# Patient Record
Sex: Female | Born: 1945
Health system: Southern US, Community
[De-identification: ages and names within clinical notes are randomized; demographics above are authoritative.]

## PROBLEM LIST (undated history)

## (undated) DIAGNOSIS — M1990 Unspecified osteoarthritis, unspecified site: Secondary | ICD-10-CM

## (undated) DIAGNOSIS — F329 Major depressive disorder, single episode, unspecified: Secondary | ICD-10-CM

## (undated) DIAGNOSIS — R519 Headache, unspecified: Secondary | ICD-10-CM

## (undated) DIAGNOSIS — K219 Gastro-esophageal reflux disease without esophagitis: Secondary | ICD-10-CM

## (undated) DIAGNOSIS — G459 Transient cerebral ischemic attack, unspecified: Secondary | ICD-10-CM

## (undated) DIAGNOSIS — I499 Cardiac arrhythmia, unspecified: Secondary | ICD-10-CM

## (undated) DIAGNOSIS — I10 Essential (primary) hypertension: Secondary | ICD-10-CM

## (undated) DIAGNOSIS — K449 Diaphragmatic hernia without obstruction or gangrene: Secondary | ICD-10-CM

## (undated) DIAGNOSIS — F32A Depression, unspecified: Secondary | ICD-10-CM

## (undated) DIAGNOSIS — K635 Polyp of colon: Secondary | ICD-10-CM

## (undated) HISTORY — PX: ABLATION: SHX5711

## (undated) HISTORY — DX: Diaphragmatic hernia without obstruction or gangrene: K44.9

## (undated) HISTORY — DX: Major depressive disorder, single episode, unspecified: F32.9

## (undated) HISTORY — PX: ABDOMINAL HYSTERECTOMY: SHX81

## (undated) HISTORY — PX: PACEMAKER IMPLANT: EP1218

## (undated) HISTORY — DX: Depression, unspecified: F32.A

## (undated) HISTORY — DX: Essential (primary) hypertension: I10

## (undated) HISTORY — PX: INGUINAL HERNIA REPAIR: SUR1180

## (undated) HISTORY — PX: HERNIA REPAIR: SHX51

## (undated) HISTORY — DX: Polyp of colon: K63.5

## (undated) HISTORY — PX: CHOLECYSTECTOMY: SHX55

## (undated) HISTORY — PX: OTHER SURGICAL HISTORY: SHX169

## (undated) HISTORY — DX: Gastro-esophageal reflux disease without esophagitis: K21.9

---

## 1999-09-17 ENCOUNTER — Encounter: Admission: RE | Admit: 1999-09-17 | Discharge: 1999-09-17 | Payer: Self-pay | Admitting: Family Medicine

## 1999-09-17 ENCOUNTER — Encounter: Payer: Self-pay | Admitting: Family Medicine

## 1999-09-23 ENCOUNTER — Encounter: Payer: Self-pay | Admitting: Family Medicine

## 1999-09-23 ENCOUNTER — Encounter: Admission: RE | Admit: 1999-09-23 | Discharge: 1999-09-23 | Payer: Self-pay | Admitting: Family Medicine

## 2000-12-02 ENCOUNTER — Encounter: Payer: Self-pay | Admitting: Obstetrics and Gynecology

## 2000-12-02 ENCOUNTER — Encounter: Admission: RE | Admit: 2000-12-02 | Discharge: 2000-12-02 | Payer: Self-pay | Admitting: Obstetrics and Gynecology

## 2001-03-09 ENCOUNTER — Other Ambulatory Visit: Admission: RE | Admit: 2001-03-09 | Discharge: 2001-03-09 | Payer: Self-pay | Admitting: Obstetrics and Gynecology

## 2001-10-27 ENCOUNTER — Encounter: Payer: Self-pay | Admitting: Obstetrics and Gynecology

## 2001-10-27 ENCOUNTER — Ambulatory Visit (HOSPITAL_COMMUNITY): Admission: RE | Admit: 2001-10-27 | Discharge: 2001-10-27 | Payer: Self-pay | Admitting: Obstetrics and Gynecology

## 2003-08-10 ENCOUNTER — Encounter: Payer: Self-pay | Admitting: Obstetrics and Gynecology

## 2003-08-10 ENCOUNTER — Encounter: Admission: RE | Admit: 2003-08-10 | Discharge: 2003-08-10 | Payer: Self-pay | Admitting: Obstetrics and Gynecology

## 2005-04-20 ENCOUNTER — Ambulatory Visit (HOSPITAL_COMMUNITY): Admission: RE | Admit: 2005-04-20 | Discharge: 2005-04-20 | Payer: Self-pay | Admitting: Obstetrics and Gynecology

## 2006-06-15 ENCOUNTER — Ambulatory Visit (HOSPITAL_COMMUNITY): Admission: RE | Admit: 2006-06-15 | Discharge: 2006-06-15 | Payer: Self-pay | Admitting: Obstetrics and Gynecology

## 2007-07-25 ENCOUNTER — Encounter: Admission: RE | Admit: 2007-07-25 | Discharge: 2007-07-25 | Payer: Self-pay | Admitting: Obstetrics and Gynecology

## 2008-07-27 ENCOUNTER — Encounter: Admission: RE | Admit: 2008-07-27 | Discharge: 2008-07-27 | Payer: Self-pay | Admitting: Obstetrics and Gynecology

## 2009-07-29 ENCOUNTER — Encounter: Admission: RE | Admit: 2009-07-29 | Discharge: 2009-07-29 | Payer: Self-pay | Admitting: Obstetrics and Gynecology

## 2010-11-23 ENCOUNTER — Encounter: Payer: Self-pay | Admitting: Obstetrics and Gynecology

## 2011-04-23 ENCOUNTER — Ambulatory Visit (HOSPITAL_COMMUNITY)
Admission: RE | Admit: 2011-04-23 | Discharge: 2011-04-23 | Disposition: A | Discharge status: Home / Self Care | Payer: Medicare Other | Source: Ambulatory Visit | Attending: Internal Medicine | Admitting: Internal Medicine

## 2011-04-23 ENCOUNTER — Encounter (HOSPITAL_BASED_OUTPATIENT_CLINIC_OR_DEPARTMENT_OTHER): Payer: Medicare Other | Admitting: Internal Medicine

## 2011-04-23 ENCOUNTER — Other Ambulatory Visit (INDEPENDENT_AMBULATORY_CARE_PROVIDER_SITE_OTHER): Payer: Self-pay | Admitting: Internal Medicine

## 2011-04-23 DIAGNOSIS — Z79899 Other long term (current) drug therapy: Secondary | ICD-10-CM | POA: Insufficient documentation

## 2011-04-23 DIAGNOSIS — Z800 Family history of malignant neoplasm of digestive organs: Secondary | ICD-10-CM

## 2011-04-23 DIAGNOSIS — I10 Essential (primary) hypertension: Secondary | ICD-10-CM | POA: Insufficient documentation

## 2011-04-23 DIAGNOSIS — D126 Benign neoplasm of colon, unspecified: Secondary | ICD-10-CM | POA: Insufficient documentation

## 2011-04-23 DIAGNOSIS — Z1211 Encounter for screening for malignant neoplasm of colon: Secondary | ICD-10-CM | POA: Insufficient documentation

## 2011-05-12 NOTE — Op Note (Signed)
  NAMEASMI, FUGERE               ACCOUNT NO.:  [id number]  MEDICAL RECORD NO.:  [id number]  LOCATION:  [facility]                          FACILITY:  [facility]  PHYSICIAN:  Lionel December, M.D.    DATE OF BIRTH:  [birth year]  DATE OF PROCEDURE:  04/23/2011 DATE OF DISCHARGE:                              OPERATIVE REPORT   PROCEDURE:  Colonoscopy.  INDICATION:  Janet Bailey is 65 year old Caucasian female who is undergoing average risk screening colonoscopy.  Procedures were reviewed with the patient.  Informed consent was obtained.  MEDS FOR CONSCIOUS SEDATION: 1. Demerol 50 mg IV. 2. Versed 5 mg IV.  FINDINGS:  Procedure performed in endoscopy suite.  The patient's vital signs and O2 sat were monitored during the procedure and remained stable.  The patient was placed in left lateral recumbent position. Rectal examination performed.  She had soft skin tag external, but digital exam was normal.  Pentax videoscope was placed through rectum and advanced under vision into sigmoid colon and beyond.  Redundant tortuous colon.  Preparation was excellent.  Scope was passed into cecum which was identified by appendiceal orifice as stump and ileocecal valve.  Pictures taken for the record.  As the scope was withdrawn, colonic mucosa was carefully examined.  There was a 5-mm flat polyp at distal transverse colon which was ablated via cold biopsy.  Mucosa and rest of the colon was normal.  Rectal mucosa similarly was normal. Scope was retroflexed to examine anorectal junction which was unremarkable.  Endoscope was then withdrawn.  Withdrawal time was 20 minutes.  The patient tolerated the procedure well.  FINAL DIAGNOSES: 1. Examination performed to cecum. 2. A single 5-mm polyp at distal transverse colon which was ablated     via cold biopsy.  RECOMMENDATIONS: 1. Standard instructions given. 2. I will be contacting patient with results of biopsy and further     recommendations.     ______________________________ Lionel December, M.D.     NR/MEDQ  D:  04/23/2011  T:  04/23/2011  Job:  [id number]  cc:   Doreen Beam, MD Fax: [phone]  Electronically Signed by Lionel December M.D. on 05/12/2011 08:15:27 PM

## 2011-06-04 ENCOUNTER — Other Ambulatory Visit: Payer: Self-pay | Admitting: Obstetrics and Gynecology

## 2011-06-04 DIAGNOSIS — Z1231 Encounter for screening mammogram for malignant neoplasm of breast: Secondary | ICD-10-CM

## 2011-08-04 ENCOUNTER — Ambulatory Visit: Payer: Medicare Other

## 2011-08-10 ENCOUNTER — Ambulatory Visit
Admission: RE | Admit: 2011-08-10 | Discharge: 2011-08-10 | Disposition: A | Discharge status: Home / Self Care | Payer: Medicare Other | Source: Ambulatory Visit | Attending: Obstetrics and Gynecology | Admitting: Obstetrics and Gynecology

## 2011-08-10 DIAGNOSIS — Z1231 Encounter for screening mammogram for malignant neoplasm of breast: Secondary | ICD-10-CM

## 2012-07-20 ENCOUNTER — Other Ambulatory Visit: Payer: Self-pay | Admitting: Obstetrics and Gynecology

## 2012-07-20 DIAGNOSIS — Z1231 Encounter for screening mammogram for malignant neoplasm of breast: Secondary | ICD-10-CM

## 2012-08-12 ENCOUNTER — Ambulatory Visit
Admission: RE | Admit: 2012-08-12 | Discharge: 2012-08-12 | Disposition: A | Discharge status: Home / Self Care | Payer: Medicare Other | Source: Ambulatory Visit | Attending: Obstetrics and Gynecology | Admitting: Obstetrics and Gynecology

## 2012-08-12 DIAGNOSIS — Z1231 Encounter for screening mammogram for malignant neoplasm of breast: Secondary | ICD-10-CM

## 2013-08-14 ENCOUNTER — Ambulatory Visit (INDEPENDENT_AMBULATORY_CARE_PROVIDER_SITE_OTHER): Payer: Medicare Other | Admitting: Obstetrics and Gynecology

## 2013-08-14 ENCOUNTER — Encounter: Payer: Self-pay | Admitting: Obstetrics and Gynecology

## 2013-08-14 VITALS — BP 148/78 | Ht <= 58 in | Wt 119.2 lb

## 2013-08-14 DIAGNOSIS — Z01419 Encounter for gynecological examination (general) (routine) without abnormal findings: Secondary | ICD-10-CM

## 2013-08-14 DIAGNOSIS — Z1389 Encounter for screening for other disorder: Secondary | ICD-10-CM

## 2013-08-14 DIAGNOSIS — N898 Other specified noninflammatory disorders of vagina: Secondary | ICD-10-CM

## 2013-08-14 LAB — POCT URINALYSIS DIPSTICK
Glucose, UA: NEGATIVE
Nitrite, UA: NEGATIVE

## 2013-08-14 MED ORDER — ESTRADIOL 0.1 MG/GM VA CREA: 2.0000 g | TOPICAL_CREAM | Freq: Every day | VAGINAL | Status: DC

## 2013-08-14 MED ORDER — NYSTATIN-TRIAMCINOLONE 100000-0.1 UNIT/GM-% EX OINT: TOPICAL_OINTMENT | Freq: Two times a day (BID) | CUTANEOUS | Status: DC

## 2013-08-14 NOTE — Patient Instructions (Signed)
Use estrace twice weekly in the vagina Use mytrex on the irritated tissues.

## 2013-08-14 NOTE — Progress Notes (Signed)
Patient ID: Janet Bailey, female   DOB: 17-Jul-1946, 67 y.o.   MRN: [id number]  Assessment:  Annual Gyn Exam  Vulvar atrophy  Plan:   1. return annually or prn q 2 yr 3    Annual mammogram advised 2. Rx mytrex topical , Rx Estrace VC biweekly Subjective:  Janet Bailey is a 67 y.o. female No obstetric history on file. who presents for annual exam. No LMP recorded. Patient has had a hysterectomy. The patient has complaints today of: see gu ROS below.   The following portions of the patient's history were reviewed and updated as appropriate: allergies, current medications, past family history, past medical history, past social history, past surgical history and problem list.  Review of Systems Constitutional: negative Gastrointestinal: negative Genitourinary: C/O BURN HURTS on left of vagina, hurts to pee,   Objective:  BP 148/78  Ht 4\' 10"  (1.473 m)  Wt 119 lb 3.2 oz (54.069 kg)  BMI 24.92 kg/m2   BMI: Body mass index is 24.92 kg/(m^2).  General Appearance: Alert, appropriate appearance for age. No acute distress HEENT: Grossly normal Neck / Thyroid:  Cardiovascular: RRR; normal S1, S2, no murmur Lungs: CTA bilaterally Back: No CVAT Breast Exam: No dimpling, nipple retraction or discharge. No masses or nodes., Normal to inspection, Normal breast tissue bilaterally and No masses or nodes.No dimpling, nipple retraction or discharge. Gastrointestinal: Soft, non-tender, no masses or organomegaly Pelvic Exam: External genitalia: slight excoriation,  Vaginal: atrophic mucosa Adnexa: normal bimanual exam Uterus: removed surgically Rectovaginal: not indicated Lymphatic Exam: Non-palpable nodes in neck, clavicular, axillary, or inguinal regions   Skin: no rash or abnormalities Neurologic: Normal gait and speech, no tremor  Psychiatric: Alert and oriented, appropriate affect.  Urinalysis:normal and Not done  Christin Bach. MD Pgr [phone] 3:36 PM

## 2013-08-30 ENCOUNTER — Other Ambulatory Visit: Payer: Self-pay

## 2013-08-30 DIAGNOSIS — Z1231 Encounter for screening mammogram for malignant neoplasm of breast: Secondary | ICD-10-CM

## 2013-09-05 ENCOUNTER — Other Ambulatory Visit: Payer: Self-pay | Admitting: *Deleted

## 2013-10-06 ENCOUNTER — Ambulatory Visit
Admission: RE | Admit: 2013-10-06 | Discharge: 2013-10-06 | Disposition: A | Discharge status: Home / Self Care | Payer: Medicare Other | Source: Ambulatory Visit

## 2013-10-06 DIAGNOSIS — Z1231 Encounter for screening mammogram for malignant neoplasm of breast: Secondary | ICD-10-CM

## 2013-12-14 ENCOUNTER — Ambulatory Visit (INDEPENDENT_AMBULATORY_CARE_PROVIDER_SITE_OTHER): Payer: Medicare Other | Admitting: Internal Medicine

## 2013-12-20 ENCOUNTER — Ambulatory Visit (INDEPENDENT_AMBULATORY_CARE_PROVIDER_SITE_OTHER): Payer: Medicare Other | Admitting: Internal Medicine

## 2014-02-26 ENCOUNTER — Encounter (INDEPENDENT_AMBULATORY_CARE_PROVIDER_SITE_OTHER): Payer: Self-pay | Admitting: *Deleted

## 2014-02-26 ENCOUNTER — Ambulatory Visit (INDEPENDENT_AMBULATORY_CARE_PROVIDER_SITE_OTHER): Payer: Medicare HMO | Admitting: Internal Medicine

## 2014-02-26 ENCOUNTER — Encounter (INDEPENDENT_AMBULATORY_CARE_PROVIDER_SITE_OTHER): Payer: Self-pay | Admitting: Internal Medicine

## 2014-02-26 VITALS — BP 114/80 | HR 84 | Temp 98.0°F | Ht 60.0 in | Wt 120.7 lb

## 2014-02-26 DIAGNOSIS — I10 Essential (primary) hypertension: Secondary | ICD-10-CM | POA: Insufficient documentation

## 2014-02-26 DIAGNOSIS — G8929 Other chronic pain: Secondary | ICD-10-CM

## 2014-02-26 DIAGNOSIS — R1031 Right lower quadrant pain: Secondary | ICD-10-CM

## 2014-02-26 DIAGNOSIS — K219 Gastro-esophageal reflux disease without esophagitis: Secondary | ICD-10-CM

## 2014-02-26 DIAGNOSIS — R1011 Right upper quadrant pain: Secondary | ICD-10-CM | POA: Insufficient documentation

## 2014-02-26 NOTE — Patient Instructions (Signed)
US abdomen. CBC and CMET. Further recommendations to follow.

## 2014-02-26 NOTE — Progress Notes (Signed)
Subjective:     Patient ID: Janet Bailey, female   DOB: [birth year], 68 y.o.   MRN: [id number]  HPI Presents today with c/o rt upper quadrant radiating into back. She will have the pain after she eats.  Some nausea after eating. Symptoms since 1st of year. Back in august she saw some blood when she wiped after she had a BM. Saturday, she ate a poptart and she had pain. She usually has a BM one every 2-3 days. Stools are formed and some are small. Cholecystectomy in 1994. Appetite is good. No weight loss. No melena or bright red rectal bleeding. Acid reflux controlled with Omeprazole.  No urinary symptoms   Her last colonoscopy was in 2012 which reveal tubular adenoma. No high grade dysplasia or malignancy identified. Next colonoscopy in 10 yrs.  Review of Systems Past Medical History  Diagnosis Date  . Hypertension   . GERD (gastroesophageal reflux disease)     Past Surgical History  Procedure Laterality Date  . Abdominal hysterectomy    . Cholecystectomy    . Tension free transvaginal tape procedure      No Known Allergies  Current Outpatient Prescriptions on File Prior to Visit  Medication Sig Dispense Refill  . amitriptyline (ELAVIL) 100 MG tablet Take 100 mg by mouth at bedtime.      Marland Kitchen lisinopril-hydrochlorothiazide (PRINZIDE,ZESTORETIC) 10-12.5 MG per tablet Take 1 tablet by mouth daily.       Marland Kitchen omeprazole (PRILOSEC) 20 MG capsule Take 20 mg by mouth daily.      Marland Kitchen estradiol (ESTRACE VAGINAL) 0.1 MG/GM vaginal cream Place 9.52 Applicatorfuls vaginally daily.  42.5 g  12  . nystatin-triamcinolone ointment (MYCOLOG) Apply topically 2 (two) times daily.  30 g  0   No current facility-administered medications on file prior to visit.      Alert and oriented. Skin warm and dry. Oral mucosa is moist.   . Sclera anicteric, conjunctivae is pink. Thyroid not enlarged. No cervical lymphadenopathy. Lungs clear. Heart regular rate and rhythm.  Abdomen is soft. Bowel sounds are  positive. No hepatomegaly. No abdominal masses felt. No tenderness.  No edema to lower extremities.       Objective:   Physical Exam  Filed Vitals:   02/26/14 1443  BP: 114/80  Pulse: 84  Temp: 98 F (36.7 C)  Height: 5' (1.524 m)  Weight: 120 lb 11.2 oz (54.749 kg)   Alert and oriented. Skin warm and dry. Oral mucosa is moist.   . Sclera anicteric, conjunctivae is pink. Thyroid not enlarged. No cervical lymphadenopathy. Lungs clear. Heart regular rate and rhythm.  Abdomen is soft. Bowel sounds are positive. No hepatomegaly. No abdominal masses felt. No tenderness.  No edema to lower extremities.Stool brown and guaiac negative.     Assessment:    Rt upper and rt lower abdominal pain. ? Etiology. Hx of Cholecystectomy in the past for a non-functioning GB.     Plan:     US abdomen. Further recommendations once we have results back. CMET, CBC

## 2014-02-27 LAB — CBC WITH DIFFERENTIAL/PLATELET
BASOS ABS: 0 10*3/uL (ref 0.0–0.1)
BASOS PCT: 0 % (ref 0–1)
EOS ABS: 0.1 10*3/uL (ref 0.0–0.7)
EOS PCT: 1 % (ref 0–5)
HEMATOCRIT: 36.3 % (ref 36.0–46.0)
HEMOGLOBIN: 12.4 g/dL (ref 12.0–15.0)
Lymphocytes Relative: 22 % (ref 12–46)
Lymphs Abs: 1.8 10*3/uL (ref 0.7–4.0)
MCH: 30.5 pg (ref 26.0–34.0)
MCHC: 34.2 g/dL (ref 30.0–36.0)
MCV: 89.4 fL (ref 78.0–100.0)
MONO ABS: 0.4 10*3/uL (ref 0.1–1.0)
MONOS PCT: 5 % (ref 3–12)
NEUTROS ABS: 6 10*3/uL (ref 1.7–7.7)
Neutrophils Relative %: 72 % (ref 43–77)
Platelets: 267 10*3/uL (ref 150–400)
RBC: 4.06 MIL/uL (ref 3.87–5.11)
RDW: 13.7 % (ref 11.5–15.5)
WBC: 8.4 10*3/uL (ref 4.0–10.5)

## 2014-02-27 LAB — COMPREHENSIVE METABOLIC PANEL
ALBUMIN: 4.3 g/dL (ref 3.5–5.2)
ALK PHOS: 79 U/L (ref 39–117)
ALT: 60 U/L — ABNORMAL HIGH (ref 0–35)
AST: 19 U/L (ref 0–37)
BUN: 8 mg/dL (ref 6–23)
CO2: 27 mEq/L (ref 19–32)
CREATININE: 0.55 mg/dL (ref 0.50–1.10)
Calcium: 9.5 mg/dL (ref 8.4–10.5)
Chloride: 99 mEq/L (ref 96–112)
GLUCOSE: 100 mg/dL — AB (ref 70–99)
POTASSIUM: 3.8 meq/L (ref 3.5–5.3)
Sodium: 136 mEq/L (ref 135–145)
Total Bilirubin: 0.4 mg/dL (ref 0.2–1.2)
Total Protein: 6.2 g/dL (ref 6.0–8.3)

## 2014-02-28 ENCOUNTER — Ambulatory Visit (HOSPITAL_COMMUNITY)
Admission: RE | Admit: 2014-02-28 | Discharge: 2014-02-28 | Disposition: A | Discharge status: Home / Self Care | Payer: Medicare HMO | Source: Ambulatory Visit | Attending: Internal Medicine | Admitting: Internal Medicine

## 2014-02-28 ENCOUNTER — Other Ambulatory Visit (INDEPENDENT_AMBULATORY_CARE_PROVIDER_SITE_OTHER): Payer: Self-pay | Admitting: Internal Medicine

## 2014-02-28 DIAGNOSIS — R1011 Right upper quadrant pain: Secondary | ICD-10-CM | POA: Insufficient documentation

## 2014-02-28 DIAGNOSIS — R1031 Right lower quadrant pain: Secondary | ICD-10-CM

## 2014-02-28 DIAGNOSIS — G8929 Other chronic pain: Secondary | ICD-10-CM

## 2014-08-06 ENCOUNTER — Other Ambulatory Visit: Payer: Self-pay

## 2014-08-06 DIAGNOSIS — Z1231 Encounter for screening mammogram for malignant neoplasm of breast: Secondary | ICD-10-CM

## 2014-08-21 ENCOUNTER — Encounter (INDEPENDENT_AMBULATORY_CARE_PROVIDER_SITE_OTHER): Payer: Self-pay | Admitting: Internal Medicine

## 2014-08-21 ENCOUNTER — Ambulatory Visit (INDEPENDENT_AMBULATORY_CARE_PROVIDER_SITE_OTHER): Payer: Medicare HMO | Admitting: Internal Medicine

## 2014-08-21 VITALS — BP 116/78 | HR 82 | Temp 97.1°F | Resp 18 | Ht 60.0 in | Wt 122.9 lb

## 2014-08-21 DIAGNOSIS — K5900 Constipation, unspecified: Secondary | ICD-10-CM

## 2014-08-21 DIAGNOSIS — R1031 Right lower quadrant pain: Secondary | ICD-10-CM

## 2014-08-21 DIAGNOSIS — K219 Gastro-esophageal reflux disease without esophagitis: Secondary | ICD-10-CM

## 2014-08-21 MED ORDER — INULIN 1.5 G PO CHEW: 1.0000 | CHEWABLE_TABLET | Freq: Two times a day (BID) | ORAL | Status: DC

## 2014-08-21 MED ORDER — POLYETHYLENE GLYCOL 3350 17 GM/SCOOP PO POWD: 0.5000 g | Freq: Every day | ORAL | Status: DC

## 2014-08-21 NOTE — Progress Notes (Signed)
Presenting complaint;  Follow for GERD nausea vomiting and right lower quadrant abdominal pain.  Subjective:  Patient is 68 year old Caucasian female who is here for a scheduled visit. She has multiple complaints. She was last seen 6 months ago by Ms. Setzer, NP. She was noted to have mildly elevated ALT. Ultrasound revealed fatty liver the bile duct was nondilated. She stays OTC Nexium is working. However she has intermittent breakthrough symptoms in the evening. Previously she has tried Prilosec and pantoprazole did not get satisfactory relief. She denies dysphagia sore throat hoarseness or chronic cough. She states she has had GERD symptoms or 20 years. She had EGD or 20 years ago but does not remember the findings. He also tells me she had single episode of hematemesis last year when she was having Spell with nausea and vomiting. She has intermittent constipation. Her bowels been irregular for the last 3 weeks and they've been ribbonlike. She has not experienced melena or rectal bleeding. She continues to complain of intermittent right low quadrant abdominal pain. She had 3 spells of nausea and vomiting last year and 3 spells since her last visit. Most recently spell was one month ago. He spell starts that abdominal pain and urge to have a bowel movement and if she's not able to do so she has nausea and vomiting and these resolved spontaneously. She has good appetite and her weight has been stable. She has tried activity but it did not help. Last colonoscopy was in June 2012 with removal of following that a polyp from transverse colon was tubular adenoma.   Current Medications: Outpatient Encounter Prescriptions as of 08/21/2014  Medication Sig  . amitriptyline (ELAVIL) 100 MG tablet Take 50 mg by mouth at bedtime.   . Calcium Carbonate-Vitamin D (CALCIUM 600+D) 600-200 MG-UNIT TABS Take by mouth daily.  . cholecalciferol (VITAMIN D) 1000 UNITS tablet Take 1,000 Units by mouth 2 (two) times  daily.  Marland Kitchen esomeprazole (NEXIUM 24HR) 20 MG capsule Take 20 mg by mouth daily at 12 noon. OTC  . estradiol (ESTRACE VAGINAL) 0.1 MG/GM vaginal cream Place 2.67 Applicatorfuls vaginally daily.  Marland Kitchen ibuprofen (ADVIL,MOTRIN) 200 MG tablet Take 200 mg by mouth 2 (two) times daily as needed.  Marland Kitchen lisinopril-hydrochlorothiazide (PRINZIDE,ZESTORETIC) 10-12.5 MG per tablet Take 1 tablet by mouth daily.   . [DISCONTINUED] nystatin-triamcinolone ointment (MYCOLOG) Apply topically 2 (two) times daily.  . [DISCONTINUED] omeprazole (PRILOSEC) 20 MG capsule Take 20 mg by mouth daily.     Objective: Blood pressure 116/78, pulse 82, temperature 97.1 F (36.2 C), temperature source Oral, resp. rate 18, height 5' (1.524 m), weight 122 lb 14.4 oz (55.747 kg). Patient is alert and in no acute distress. Conjunctiva is pink. Sclera is nonicteric Oropharyngeal mucosa is normal. No neck masses or thyromegaly noted. Cardiac exam with regular rhythm normal S1 and S2. No murmur or gallop noted. Lungs are clear to auscultation. Abdomen is symmetrical. Bowel sounds are normal. On palpation abdomen is soft and nontender without organomegaly or masses.  No LE edema or clubbing noted.  Labs/studies Results:  Ultrasound on 02/28/2014 revealed fatty liver evidence of cholecystectomy and normal-sized bile duct of 2.7 mm.  CBC on 02/26/2014 was normal. Comprehensive chemistry panel on 02/26/2014 was normal except ALT of 60(AST was 19).  Assessment:  #1. Chronic GERD. She is having intermittent breakthrough symptoms in the evening. She does not have a long symptoms or dysphagia. #2. Single episode of hematemesis last year possibly due to Mallory-Weiss tear. If she has another episode  of hematemesis would consider EGD. #3. Intermittent nausea and vomiting of unclear etiology. #4. Right low quadrant abdominal pain possibly secondary to IBS. #5. Mildly elevated ALT 6 months ago possibly due to fatty liver. Will request copy of  recent blood work from PCP before and any ordered.   Plan:  Patient can take Zantac OTC 150 mg or Pepcid OTC 20 mg daily when necessary for breakthrough heartburn.  patient advised to call the office immediately if she has another episode of hematemesis. Fiber choice 1.5 g by mouth twice a day. Polyethylene glycol 8.5 g by mouth each bedtime. Will request recent blood work from Dr.Vyas,s office. Patient will keep symptom diary as to nausea and vomiting episodes until next office visit in 6 months.

## 2014-08-21 NOTE — Patient Instructions (Addendum)
Notify if you want blood again. Can take  Zantac OTC 150 mg or Pepcid OTC 20 mg on an as-needed basis

## 2014-08-22 MED ORDER — POLYETHYLENE GLYCOL 3350 17 GM/SCOOP PO POWD: 8.5000 g | Freq: Every day | ORAL | Status: DC

## 2014-10-08 ENCOUNTER — Ambulatory Visit
Admission: RE | Admit: 2014-10-08 | Discharge: 2014-10-08 | Disposition: A | Discharge status: Home / Self Care | Payer: Medicare HMO | Source: Ambulatory Visit

## 2014-10-08 DIAGNOSIS — Z1231 Encounter for screening mammogram for malignant neoplasm of breast: Secondary | ICD-10-CM

## 2014-10-16 ENCOUNTER — Encounter (INDEPENDENT_AMBULATORY_CARE_PROVIDER_SITE_OTHER): Payer: Self-pay

## 2015-02-26 ENCOUNTER — Ambulatory Visit (INDEPENDENT_AMBULATORY_CARE_PROVIDER_SITE_OTHER): Payer: Medicare HMO | Admitting: Internal Medicine

## 2015-09-12 ENCOUNTER — Other Ambulatory Visit: Payer: Self-pay

## 2015-09-12 DIAGNOSIS — Z1231 Encounter for screening mammogram for malignant neoplasm of breast: Secondary | ICD-10-CM

## 2015-10-16 ENCOUNTER — Ambulatory Visit
Admission: RE | Admit: 2015-10-16 | Discharge: 2015-10-16 | Disposition: A | Discharge status: Home / Self Care | Payer: Medicare HMO | Source: Ambulatory Visit

## 2015-10-16 DIAGNOSIS — Z1231 Encounter for screening mammogram for malignant neoplasm of breast: Secondary | ICD-10-CM

## 2016-09-10 ENCOUNTER — Other Ambulatory Visit: Payer: Self-pay | Admitting: Internal Medicine

## 2016-09-10 DIAGNOSIS — Z1231 Encounter for screening mammogram for malignant neoplasm of breast: Secondary | ICD-10-CM

## 2016-10-16 ENCOUNTER — Ambulatory Visit
Admission: RE | Admit: 2016-10-16 | Discharge: 2016-10-16 | Disposition: A | Discharge status: Home / Self Care | Payer: Medicare HMO | Source: Ambulatory Visit | Attending: Internal Medicine | Admitting: Internal Medicine

## 2016-10-16 DIAGNOSIS — Z1231 Encounter for screening mammogram for malignant neoplasm of breast: Secondary | ICD-10-CM

## 2017-09-06 ENCOUNTER — Other Ambulatory Visit: Payer: Self-pay | Admitting: Internal Medicine

## 2017-09-06 ENCOUNTER — Other Ambulatory Visit: Payer: Self-pay | Admitting: Cardiology

## 2017-09-06 DIAGNOSIS — Z1231 Encounter for screening mammogram for malignant neoplasm of breast: Secondary | ICD-10-CM

## 2017-10-18 ENCOUNTER — Ambulatory Visit
Admission: RE | Admit: 2017-10-18 | Discharge: 2017-10-18 | Disposition: A | Discharge status: Home / Self Care | Payer: Medicare HMO | Source: Ambulatory Visit | Attending: Internal Medicine | Admitting: Internal Medicine

## 2017-10-18 DIAGNOSIS — Z1231 Encounter for screening mammogram for malignant neoplasm of breast: Secondary | ICD-10-CM

## 2018-09-02 ENCOUNTER — Other Ambulatory Visit: Payer: Self-pay | Admitting: Internal Medicine

## 2018-09-02 DIAGNOSIS — Z1231 Encounter for screening mammogram for malignant neoplasm of breast: Secondary | ICD-10-CM

## 2018-10-19 ENCOUNTER — Ambulatory Visit
Admission: RE | Admit: 2018-10-19 | Discharge: 2018-10-19 | Disposition: A | Discharge status: Home / Self Care | Payer: Medicare HMO | Source: Ambulatory Visit | Attending: Internal Medicine | Admitting: Internal Medicine

## 2018-10-19 DIAGNOSIS — Z1231 Encounter for screening mammogram for malignant neoplasm of breast: Secondary | ICD-10-CM

## 2018-11-15 ENCOUNTER — Other Ambulatory Visit (HOSPITAL_COMMUNITY): Payer: Self-pay | Admitting: Internal Medicine

## 2018-11-15 ENCOUNTER — Other Ambulatory Visit: Payer: Self-pay | Admitting: Internal Medicine

## 2018-11-15 DIAGNOSIS — R1031 Right lower quadrant pain: Secondary | ICD-10-CM

## 2018-11-15 DIAGNOSIS — R1010 Upper abdominal pain, unspecified: Secondary | ICD-10-CM

## 2018-11-16 ENCOUNTER — Ambulatory Visit (HOSPITAL_COMMUNITY)
Admission: RE | Admit: 2018-11-16 | Discharge: 2018-11-16 | Disposition: A | Discharge status: Home / Self Care | Payer: Medicare Other | Source: Ambulatory Visit | Attending: Internal Medicine | Admitting: Internal Medicine

## 2018-11-16 DIAGNOSIS — R1031 Right lower quadrant pain: Secondary | ICD-10-CM | POA: Insufficient documentation

## 2018-11-16 DIAGNOSIS — R1010 Upper abdominal pain, unspecified: Secondary | ICD-10-CM | POA: Diagnosis present

## 2018-11-16 MED ORDER — IOPAMIDOL (ISOVUE-300) INJECTION 61%
100.0000 mL | Freq: Once | INTRAVENOUS | Status: AC | PRN
  Administered 2018-11-16: 100 mL via INTRAVENOUS

## 2018-11-25 ENCOUNTER — Telehealth: Payer: Self-pay | Admitting: Internal Medicine

## 2018-11-25 NOTE — Telephone Encounter (Signed)
Received records from [place] and will be placed on Dr. Blanch Media desk for review.  Pt is requesting Dr. Henrene Pastor; she stated [facility] is too far and that she wasn't satisfied with the other doctors.

## 2018-11-30 NOTE — Telephone Encounter (Signed)
Okay for routine GI office appointment in new patient slot only.

## 2018-12-06 ENCOUNTER — Ambulatory Visit: Payer: Medicare Other | Admitting: Gynecology

## 2018-12-06 ENCOUNTER — Encounter: Payer: Self-pay | Admitting: Gynecology

## 2018-12-06 VITALS — BP 124/70 | Ht <= 58 in | Wt 117.0 lb

## 2018-12-06 DIAGNOSIS — Z01419 Encounter for gynecological examination (general) (routine) without abnormal findings: Secondary | ICD-10-CM | POA: Diagnosis not present

## 2018-12-06 DIAGNOSIS — R32 Unspecified urinary incontinence: Secondary | ICD-10-CM | POA: Diagnosis not present

## 2018-12-06 DIAGNOSIS — N952 Postmenopausal atrophic vaginitis: Secondary | ICD-10-CM | POA: Diagnosis not present

## 2018-12-06 DIAGNOSIS — R102 Pelvic and perineal pain: Secondary | ICD-10-CM

## 2018-12-06 NOTE — Patient Instructions (Addendum)
Follow up for ultrasound as scheduled 

## 2018-12-06 NOTE — Progress Notes (Signed)
    Janet Bailey [birth year] [id number]        73 y.o.  G2P2 new patient for annual gynecologic exam.  Also complaining of pelvic pressure and urinary incontinence.  History of TAH in the past for leiomyoma.  TVT by Dr. Glo Herring.  Notes over the past 6 months or so feeling some bulging and wondering whether she is having a drop in her bladder as well as having issues with urinary incontinence with loss of urine spontaneously.  Notes vaginal burning and irritation.  Past medical history,surgical history, problem list, medications, allergies, family history and social history were all reviewed and documented as reviewed in the EPIC chart.  ROS:  Performed with pertinent positives and negatives included in the history, assessment and plan.   Additional significant findings : None   Exam: Caryn Bee assistant Vitals:   12/06/18 1111  BP: 124/70  Weight: 117 lb (53.1 kg)  Height: 4\' 10"  (1.473 m)   Body mass index is 24.45 kg/m.  General appearance:  Normal affect, orientation and appearance. Skin: Grossly normal HEENT: Without gross lesions.  No cervical or supraclavicular adenopathy. Thyroid normal.  Lungs:  Clear without wheezing, rales or rhonchi Cardiac: RR, without RMG Abdominal:  Soft, nontender, without masses, guarding, rebound, organomegaly or hernia Breasts:  Examined lying and sitting without masses, retractions, discharge or axillary adenopathy. Pelvic:  Ext, BUS, Vagina: With atrophic changes.  Vaginal walls/cuff well supported.  No overt evidence of cystocele rectocele or cuff prolapse.  Adnexa: Without masses or tenderness    Anus and perineum: Normal   Rectovaginal: Normal sphincter tone without palpated masses or tenderness.    Assessment/Plan:  73 y.o. G2P2 female for annual gynecologic exam.   1. Pelvic pressure/vaginal burning with irritation.  Exam shows vaginal tissues well supported but atrophic.  No overt evidence of prolapse.  Discussed vaginal atrophy  and associated symptoms to include vaginal discomfort as well as bladder issues.  Options for management reviewed with the patient to include trial of vaginal estrogen as well as urologic referral.  Given her total picture my recommendation would be to start on vaginal estrogen twice weekly.  We discussed various products and the risks of absorption with systemic effects to include thrombosis such as stroke heart attack DVT in the breast cancer issue.  Patient wants to think about this and will call if she wants to go ahead and try.  I discussed [facility] and prefilled syringes versus pharmaceutical produced such as Premarin and Estrace.  Given her symptoms of pelvic pressure I did recommend baseline ultrasound to rule out ovarian process before continuing expectant management and she will schedule in follow-up for this. 2. Mammography 10/2018.  Continue with annual mammography when due.  Breast exam normal today. 3. Colonoscopy 2016.  Repeat at their recommended interval. 4. Pap smear 2016.  No Pap smear done today.  No history of abnormal Pap smears.  Discussed current screening guidelines and we both agree to stop screening based on age and hysterectomy history. 5. DEXA 2018.  Do not have copies of these results.  She will continue to follow-up with her primary in reference to bone health. 6. Health maintenance.  No routine lab work done as patient does this elsewhere.  Follow-up for ultrasound.  Follow-up with decision about vaginal estrogen.  Follow-up in 1 year for annual exam.   Anastasio Auerbach MD, 11:53 AM 12/06/2018

## 2018-12-07 LAB — URINALYSIS, COMPLETE W/RFL CULTURE
Bacteria, UA: NONE SEEN /HPF
Bilirubin Urine: NEGATIVE
GLUCOSE, UA: NEGATIVE
HYALINE CAST: NONE SEEN /LPF
Hgb urine dipstick: NEGATIVE
Ketones, ur: NEGATIVE
Leukocyte Esterase: NEGATIVE
NITRITES URINE, INITIAL: NEGATIVE
PH: 8 (ref 5.0–8.0)
Protein, ur: NEGATIVE
RBC / HPF: NONE SEEN /HPF (ref 0–2)
Specific Gravity, Urine: 1.008 (ref 1.001–1.03)
Squamous Epithelial / LPF: NONE SEEN /HPF (ref <=5)
WBC UA: NONE SEEN /HPF (ref 0–5)

## 2018-12-07 LAB — NO CULTURE INDICATED

## 2018-12-09 ENCOUNTER — Telehealth: Payer: Self-pay

## 2018-12-09 MED ORDER — NONFORMULARY OR COMPOUNDED ITEM: 4 refills | Status: DC

## 2018-12-09 NOTE — Telephone Encounter (Signed)
Patient called to let you know she would like to proceed with compound Estradiol vaginal cream at [facility].

## 2018-12-09 NOTE — Telephone Encounter (Signed)
Okay to call in the [facility] vaginal estradiol prefilled syringes twice weekly refill x1 year

## 2018-12-09 NOTE — Telephone Encounter (Signed)
Rx phoned in. Patient knows they will call her when it is ready.

## 2018-12-12 ENCOUNTER — Ambulatory Visit: Payer: Medicare Other | Admitting: Gynecology

## 2018-12-12 ENCOUNTER — Other Ambulatory Visit: Payer: Medicare Other

## 2018-12-22 ENCOUNTER — Ambulatory Visit: Payer: Medicare Other | Admitting: Internal Medicine

## 2018-12-26 ENCOUNTER — Ambulatory Visit: Payer: Medicare Other | Admitting: Gynecology

## 2018-12-26 ENCOUNTER — Encounter: Payer: Self-pay | Admitting: Internal Medicine

## 2018-12-26 ENCOUNTER — Ambulatory Visit: Payer: Medicare Other | Admitting: Internal Medicine

## 2018-12-26 ENCOUNTER — Other Ambulatory Visit: Payer: Medicare Other

## 2018-12-26 VITALS — BP 140/64 | HR 84 | Ht <= 58 in | Wt 118.0 lb

## 2018-12-26 DIAGNOSIS — R195 Other fecal abnormalities: Secondary | ICD-10-CM

## 2018-12-26 DIAGNOSIS — K219 Gastro-esophageal reflux disease without esophagitis: Secondary | ICD-10-CM | POA: Diagnosis not present

## 2018-12-26 DIAGNOSIS — K589 Irritable bowel syndrome without diarrhea: Secondary | ICD-10-CM | POA: Diagnosis not present

## 2018-12-26 DIAGNOSIS — R109 Unspecified abdominal pain: Secondary | ICD-10-CM | POA: Diagnosis not present

## 2018-12-26 MED ORDER — HYOSCYAMINE SULFATE 0.125 MG SL SUBL: SUBLINGUAL_TABLET | SUBLINGUAL | 3 refills | Status: DC

## 2018-12-26 NOTE — Progress Notes (Signed)
HISTORY OF PRESENT ILLNESS:  Janet Bailey is a 73 y.o. female self-referred (states her daughter is a patient of the practice) regarding chronic abdominal complaints.  She reports a personal history of irritable bowel syndrome.  Historically she has had difficulties with constipation for which she used to use laxatives.  More recently intermittent loose stools often followed by a normal bowel movement.  For at least 4 years she describes intermittent problems with abdominal pain.  More recently in the periumbilical region.  Often in the right lower quadrant as well.  She is status post cholecystectomy.  She also has chronic GERD for which she takes Nexium with good relief of symptoms.  She is also status post hysterectomy.  She saw her primary care provider who felt she might have diverticulitis.  Was treated with ciprofloxacin and metronidazole though did not tolerate the antibiotics.  A contrast-enhanced CT scan of the abdomen and pelvis was obtained November 16, 2018.  No acute abnormalities.  Small hiatal hernia noted.  Was previously evaluated by Dr. Tora Duck regarding her GI complaints.  In May 2016 she underwent both colonoscopy and upper endoscopy.  Colonoscopy was said to be complete with excellent preparation.  The colon was said to be entirely normal.  Biopsies for microscopic colitis were normal.  Upper endoscopy was normal except for antral erythema.  Biopsies were benign with reactive changes.  No Helicobacter pylori.  She was seen again in 2017 for constipation.  CT scan at that time was unremarkable.  Patient tells me that she has 2-3 bowel movements per day.  The initial bowel movement is formed.  Subsequently more loose stools.  Her pain is described as cramping.  Defecation does seem to help.  She has a sister with history of Crohn's disease.  Outside blood work is unremarkable.  REVIEW OF SYSTEMS:  All non-GI ROS negative unless stated in the HPI except for back pain, fatigue,  headaches, muscle cramps, excessive urination, sleeping problems, urinary leakage  Past Medical History:  Diagnosis Date  . Colon polyps   . Depression   . GERD (gastroesophageal reflux disease)   . Hiatal hernia   . Hypertension     Past Surgical History:  Procedure Laterality Date  . ABDOMINAL HYSTERECTOMY    . CHOLECYSTECTOMY    . tension free transvaginal tape procedure      Social History Janet Bailey  reports that she has never smoked. She has never used smokeless tobacco. She reports that she does not drink alcohol or use drugs.  family history includes Aneurysm in her mother; Diabetes in her daughter; Heart disease in her father.  No Known Allergies     PHYSICAL EXAMINATION: Vital signs: BP 140/64   Pulse 84   Ht 4\' 10"  (1.473 m)   Wt 118 lb (53.5 kg)   BMI 24.66 kg/m   Constitutional: generally well-appearing, no acute distress Psychiatric: alert and oriented x3, cooperative Eyes: extraocular movements intact, anicteric, conjunctiva pink Mouth: oral pharynx moist, no lesions Neck: supple no lymphadenopathy Cardiovascular: heart regular rate and rhythm, no murmur Lungs: clear to auscultation bilaterally Abdomen: soft, nontender, nondistended, no obvious ascites, no peritoneal signs, normal bowel sounds, no organomegaly Rectal: Omitted Extremities: no clubbing or cyanosis.  1+ lower extremity edema bilaterally Skin: no lesions on visible extremities Neuro: No focal deficits.  Cranial nerves intact  ASSESSMENT:  1.  Chronic abdominal pain intermittent loose stools.  Negative extensive work-up.  Suspect irritable bowel syndrome 2.  Colonoscopy 2016 was  normal 3.  Negative recent CT 4.  Status post cholecystectomy 5.  Status post hysterectomy 6.  GERD.  Symptoms controlled with Nexium  PLAN:  1.  Metamucil 2 tablespoons daily 2.  Levsin sublingual 0.125 mg.  1-2 sublingual every 4 to 6 hours as needed for abdominal pain 3.  Routine office follow-up 3  months.

## 2018-12-26 NOTE — Patient Instructions (Signed)
We have sent the following medications to your pharmacy for you to pick up at your convenience:  Levsin  You may take 2 tablespoons of Metamucil daily in 12-16 ounces of water daily.  Please follow up in 3 months

## 2019-03-30 ENCOUNTER — Telehealth: Payer: Self-pay | Admitting: Internal Medicine

## 2019-03-30 MED ORDER — HYOSCYAMINE SULFATE 0.125 MG SL SUBL: SUBLINGUAL_TABLET | SUBLINGUAL | 3 refills | Status: DC

## 2019-03-30 NOTE — Telephone Encounter (Signed)
Patient would like a call when it has been sent to the pharmacy

## 2019-03-30 NOTE — Telephone Encounter (Signed)
Spoke to patient to let her know I refilled her Levsin.

## 2019-08-10 ENCOUNTER — Encounter: Payer: Self-pay | Admitting: Gynecology

## 2019-09-11 ENCOUNTER — Other Ambulatory Visit: Payer: Self-pay | Admitting: Internal Medicine

## 2019-09-11 DIAGNOSIS — Z1231 Encounter for screening mammogram for malignant neoplasm of breast: Secondary | ICD-10-CM

## 2019-11-02 ENCOUNTER — Other Ambulatory Visit: Payer: Self-pay

## 2019-11-02 ENCOUNTER — Ambulatory Visit
Admission: RE | Admit: 2019-11-02 | Discharge: 2019-11-02 | Disposition: A | Discharge status: Home / Self Care | Payer: Medicare Other | Source: Ambulatory Visit | Attending: Internal Medicine | Admitting: Internal Medicine

## 2019-11-02 DIAGNOSIS — Z1231 Encounter for screening mammogram for malignant neoplasm of breast: Secondary | ICD-10-CM

## 2019-11-06 ENCOUNTER — Other Ambulatory Visit: Payer: Self-pay | Admitting: Internal Medicine

## 2019-11-06 DIAGNOSIS — R928 Other abnormal and inconclusive findings on diagnostic imaging of breast: Secondary | ICD-10-CM

## 2019-11-16 ENCOUNTER — Other Ambulatory Visit: Payer: Self-pay

## 2019-11-16 ENCOUNTER — Ambulatory Visit
Admission: RE | Admit: 2019-11-16 | Discharge: 2019-11-16 | Disposition: A | Discharge status: Home / Self Care | Payer: Medicare Other | Source: Ambulatory Visit | Attending: Internal Medicine | Admitting: Internal Medicine

## 2019-11-16 ENCOUNTER — Other Ambulatory Visit: Payer: Self-pay | Admitting: Internal Medicine

## 2019-11-16 DIAGNOSIS — R921 Mammographic calcification found on diagnostic imaging of breast: Secondary | ICD-10-CM | POA: Diagnosis not present

## 2019-11-16 DIAGNOSIS — R928 Other abnormal and inconclusive findings on diagnostic imaging of breast: Secondary | ICD-10-CM

## 2019-11-23 DIAGNOSIS — Z299 Encounter for prophylactic measures, unspecified: Secondary | ICD-10-CM | POA: Diagnosis not present

## 2019-11-23 DIAGNOSIS — J329 Chronic sinusitis, unspecified: Secondary | ICD-10-CM | POA: Diagnosis not present

## 2019-11-23 DIAGNOSIS — Z789 Other specified health status: Secondary | ICD-10-CM | POA: Diagnosis not present

## 2019-11-27 DIAGNOSIS — E7800 Pure hypercholesterolemia, unspecified: Secondary | ICD-10-CM | POA: Diagnosis not present

## 2019-11-27 DIAGNOSIS — I10 Essential (primary) hypertension: Secondary | ICD-10-CM | POA: Diagnosis not present

## 2019-12-20 DIAGNOSIS — Z6821 Body mass index (BMI) 21.0-21.9, adult: Secondary | ICD-10-CM | POA: Diagnosis not present

## 2019-12-20 DIAGNOSIS — R42 Dizziness and giddiness: Secondary | ICD-10-CM | POA: Diagnosis not present

## 2019-12-20 DIAGNOSIS — Z299 Encounter for prophylactic measures, unspecified: Secondary | ICD-10-CM | POA: Diagnosis not present

## 2019-12-20 DIAGNOSIS — J069 Acute upper respiratory infection, unspecified: Secondary | ICD-10-CM | POA: Diagnosis not present

## 2019-12-20 DIAGNOSIS — I10 Essential (primary) hypertension: Secondary | ICD-10-CM | POA: Diagnosis not present

## 2020-01-02 DIAGNOSIS — I10 Essential (primary) hypertension: Secondary | ICD-10-CM | POA: Diagnosis not present

## 2020-01-02 DIAGNOSIS — E7800 Pure hypercholesterolemia, unspecified: Secondary | ICD-10-CM | POA: Diagnosis not present

## 2020-01-18 DIAGNOSIS — Z299 Encounter for prophylactic measures, unspecified: Secondary | ICD-10-CM | POA: Diagnosis not present

## 2020-01-18 DIAGNOSIS — J019 Acute sinusitis, unspecified: Secondary | ICD-10-CM | POA: Diagnosis not present

## 2020-01-18 DIAGNOSIS — I10 Essential (primary) hypertension: Secondary | ICD-10-CM | POA: Diagnosis not present

## 2020-02-12 DIAGNOSIS — N390 Urinary tract infection, site not specified: Secondary | ICD-10-CM | POA: Diagnosis not present

## 2020-02-12 DIAGNOSIS — I10 Essential (primary) hypertension: Secondary | ICD-10-CM | POA: Diagnosis not present

## 2020-02-12 DIAGNOSIS — Z7189 Other specified counseling: Secondary | ICD-10-CM | POA: Diagnosis not present

## 2020-02-12 DIAGNOSIS — Z299 Encounter for prophylactic measures, unspecified: Secondary | ICD-10-CM | POA: Diagnosis not present

## 2020-02-12 DIAGNOSIS — Z1211 Encounter for screening for malignant neoplasm of colon: Secondary | ICD-10-CM | POA: Diagnosis not present

## 2020-02-12 DIAGNOSIS — Z79899 Other long term (current) drug therapy: Secondary | ICD-10-CM | POA: Diagnosis not present

## 2020-02-12 DIAGNOSIS — R5383 Other fatigue: Secondary | ICD-10-CM | POA: Diagnosis not present

## 2020-02-12 DIAGNOSIS — Z6822 Body mass index (BMI) 22.0-22.9, adult: Secondary | ICD-10-CM | POA: Diagnosis not present

## 2020-02-12 DIAGNOSIS — Z0000 Encounter for general adult medical examination without abnormal findings: Secondary | ICD-10-CM | POA: Diagnosis not present

## 2020-02-12 DIAGNOSIS — R300 Dysuria: Secondary | ICD-10-CM | POA: Diagnosis not present

## 2020-02-12 DIAGNOSIS — E7800 Pure hypercholesterolemia, unspecified: Secondary | ICD-10-CM | POA: Diagnosis not present

## 2020-03-31 DIAGNOSIS — I10 Essential (primary) hypertension: Secondary | ICD-10-CM | POA: Diagnosis not present

## 2020-03-31 DIAGNOSIS — E7800 Pure hypercholesterolemia, unspecified: Secondary | ICD-10-CM | POA: Diagnosis not present

## 2020-04-11 DIAGNOSIS — J029 Acute pharyngitis, unspecified: Secondary | ICD-10-CM | POA: Diagnosis not present

## 2020-04-11 DIAGNOSIS — K219 Gastro-esophageal reflux disease without esophagitis: Secondary | ICD-10-CM | POA: Diagnosis not present

## 2020-04-11 DIAGNOSIS — Z299 Encounter for prophylactic measures, unspecified: Secondary | ICD-10-CM | POA: Diagnosis not present

## 2020-04-11 DIAGNOSIS — H8110 Benign paroxysmal vertigo, unspecified ear: Secondary | ICD-10-CM | POA: Diagnosis not present

## 2020-04-11 DIAGNOSIS — R519 Headache, unspecified: Secondary | ICD-10-CM | POA: Diagnosis not present

## 2020-04-29 DIAGNOSIS — S46219D Strain of muscle, fascia and tendon of other parts of biceps, unspecified arm, subsequent encounter: Secondary | ICD-10-CM | POA: Diagnosis not present

## 2020-04-29 DIAGNOSIS — I700 Atherosclerosis of aorta: Secondary | ICD-10-CM | POA: Diagnosis not present

## 2020-04-29 DIAGNOSIS — K219 Gastro-esophageal reflux disease without esophagitis: Secondary | ICD-10-CM | POA: Diagnosis not present

## 2020-04-29 DIAGNOSIS — I10 Essential (primary) hypertension: Secondary | ICD-10-CM | POA: Diagnosis not present

## 2020-04-29 DIAGNOSIS — M25511 Pain in right shoulder: Secondary | ICD-10-CM | POA: Diagnosis not present

## 2020-04-29 DIAGNOSIS — Z299 Encounter for prophylactic measures, unspecified: Secondary | ICD-10-CM | POA: Diagnosis not present

## 2020-05-01 DIAGNOSIS — E7800 Pure hypercholesterolemia, unspecified: Secondary | ICD-10-CM | POA: Diagnosis not present

## 2020-05-01 DIAGNOSIS — I10 Essential (primary) hypertension: Secondary | ICD-10-CM | POA: Diagnosis not present

## 2020-05-13 ENCOUNTER — Emergency Department (HOSPITAL_BASED_OUTPATIENT_CLINIC_OR_DEPARTMENT_OTHER): Payer: Medicare Other

## 2020-05-13 ENCOUNTER — Encounter (HOSPITAL_BASED_OUTPATIENT_CLINIC_OR_DEPARTMENT_OTHER): Payer: Self-pay | Admitting: *Deleted

## 2020-05-13 ENCOUNTER — Other Ambulatory Visit: Payer: Self-pay

## 2020-05-13 ENCOUNTER — Emergency Department (HOSPITAL_BASED_OUTPATIENT_CLINIC_OR_DEPARTMENT_OTHER)
Admission: EM | Admit: 2020-05-13 | Discharge: 2020-05-13 | Disposition: A | Discharge status: Home / Self Care | Payer: Medicare Other | Attending: Emergency Medicine | Admitting: Emergency Medicine

## 2020-05-13 DIAGNOSIS — Y929 Unspecified place or not applicable: Secondary | ICD-10-CM | POA: Diagnosis not present

## 2020-05-13 DIAGNOSIS — S79911A Unspecified injury of right hip, initial encounter: Secondary | ICD-10-CM | POA: Insufficient documentation

## 2020-05-13 DIAGNOSIS — Y939 Activity, unspecified: Secondary | ICD-10-CM | POA: Diagnosis not present

## 2020-05-13 DIAGNOSIS — S32511A Fracture of superior rim of right pubis, initial encounter for closed fracture: Secondary | ICD-10-CM | POA: Diagnosis not present

## 2020-05-13 DIAGNOSIS — Z043 Encounter for examination and observation following other accident: Secondary | ICD-10-CM | POA: Diagnosis not present

## 2020-05-13 DIAGNOSIS — S32591A Other specified fracture of right pubis, initial encounter for closed fracture: Secondary | ICD-10-CM | POA: Diagnosis not present

## 2020-05-13 DIAGNOSIS — W010XXA Fall on same level from slipping, tripping and stumbling without subsequent striking against object, initial encounter: Secondary | ICD-10-CM | POA: Diagnosis not present

## 2020-05-13 DIAGNOSIS — I959 Hypotension, unspecified: Secondary | ICD-10-CM | POA: Diagnosis not present

## 2020-05-13 DIAGNOSIS — D72829 Elevated white blood cell count, unspecified: Secondary | ICD-10-CM | POA: Diagnosis not present

## 2020-05-13 DIAGNOSIS — I10 Essential (primary) hypertension: Secondary | ICD-10-CM | POA: Diagnosis not present

## 2020-05-13 DIAGNOSIS — Z79899 Other long term (current) drug therapy: Secondary | ICD-10-CM | POA: Diagnosis not present

## 2020-05-13 DIAGNOSIS — Z5321 Procedure and treatment not carried out due to patient leaving prior to being seen by health care provider: Secondary | ICD-10-CM | POA: Diagnosis not present

## 2020-05-13 DIAGNOSIS — S329XXD Fracture of unspecified parts of lumbosacral spine and pelvis, subsequent encounter for fracture with routine healing: Secondary | ICD-10-CM | POA: Diagnosis not present

## 2020-05-13 DIAGNOSIS — S329XXA Fracture of unspecified parts of lumbosacral spine and pelvis, initial encounter for closed fracture: Secondary | ICD-10-CM | POA: Diagnosis not present

## 2020-05-13 DIAGNOSIS — Y999 Unspecified external cause status: Secondary | ICD-10-CM | POA: Insufficient documentation

## 2020-05-13 DIAGNOSIS — E860 Dehydration: Secondary | ICD-10-CM | POA: Diagnosis not present

## 2020-05-13 DIAGNOSIS — R918 Other nonspecific abnormal finding of lung field: Secondary | ICD-10-CM | POA: Diagnosis not present

## 2020-05-13 DIAGNOSIS — W19XXXA Unspecified fall, initial encounter: Secondary | ICD-10-CM

## 2020-05-13 DIAGNOSIS — E871 Hypo-osmolality and hyponatremia: Secondary | ICD-10-CM | POA: Diagnosis not present

## 2020-05-13 DIAGNOSIS — G8911 Acute pain due to trauma: Secondary | ICD-10-CM | POA: Diagnosis not present

## 2020-05-13 DIAGNOSIS — S72001A Fracture of unspecified part of neck of right femur, initial encounter for closed fracture: Secondary | ICD-10-CM | POA: Diagnosis not present

## 2020-05-13 DIAGNOSIS — S199XXA Unspecified injury of neck, initial encounter: Secondary | ICD-10-CM | POA: Diagnosis not present

## 2020-05-13 NOTE — ED Triage Notes (Signed)
She tripped and fell this evening. Right hip injury.

## 2020-05-14 DIAGNOSIS — I959 Hypotension, unspecified: Secondary | ICD-10-CM | POA: Insufficient documentation

## 2020-05-14 DIAGNOSIS — E871 Hypo-osmolality and hyponatremia: Secondary | ICD-10-CM | POA: Insufficient documentation

## 2020-05-14 DIAGNOSIS — S72001A Fracture of unspecified part of neck of right femur, initial encounter for closed fracture: Secondary | ICD-10-CM | POA: Insufficient documentation

## 2020-05-14 DIAGNOSIS — D72829 Elevated white blood cell count, unspecified: Secondary | ICD-10-CM | POA: Insufficient documentation

## 2020-05-15 DIAGNOSIS — S329XXA Fracture of unspecified parts of lumbosacral spine and pelvis, initial encounter for closed fracture: Secondary | ICD-10-CM | POA: Insufficient documentation

## 2020-05-20 DIAGNOSIS — W19XXXD Unspecified fall, subsequent encounter: Secondary | ICD-10-CM | POA: Diagnosis not present

## 2020-05-20 DIAGNOSIS — S32511D Fracture of superior rim of right pubis, subsequent encounter for fracture with routine healing: Secondary | ICD-10-CM | POA: Diagnosis not present

## 2020-05-20 DIAGNOSIS — Z7982 Long term (current) use of aspirin: Secondary | ICD-10-CM | POA: Diagnosis not present

## 2020-05-20 DIAGNOSIS — S32591D Other specified fracture of right pubis, subsequent encounter for fracture with routine healing: Secondary | ICD-10-CM | POA: Diagnosis not present

## 2020-05-20 DIAGNOSIS — I10 Essential (primary) hypertension: Secondary | ICD-10-CM | POA: Diagnosis not present

## 2020-05-20 DIAGNOSIS — Z79891 Long term (current) use of opiate analgesic: Secondary | ICD-10-CM | POA: Diagnosis not present

## 2020-05-24 DIAGNOSIS — Z79891 Long term (current) use of opiate analgesic: Secondary | ICD-10-CM | POA: Diagnosis not present

## 2020-05-24 DIAGNOSIS — Z7982 Long term (current) use of aspirin: Secondary | ICD-10-CM | POA: Diagnosis not present

## 2020-05-24 DIAGNOSIS — W19XXXD Unspecified fall, subsequent encounter: Secondary | ICD-10-CM | POA: Diagnosis not present

## 2020-05-24 DIAGNOSIS — S32511D Fracture of superior rim of right pubis, subsequent encounter for fracture with routine healing: Secondary | ICD-10-CM | POA: Diagnosis not present

## 2020-05-24 DIAGNOSIS — I10 Essential (primary) hypertension: Secondary | ICD-10-CM | POA: Diagnosis not present

## 2020-05-24 DIAGNOSIS — S32591D Other specified fracture of right pubis, subsequent encounter for fracture with routine healing: Secondary | ICD-10-CM | POA: Diagnosis not present

## 2020-05-27 DIAGNOSIS — Z09 Encounter for follow-up examination after completed treatment for conditions other than malignant neoplasm: Secondary | ICD-10-CM | POA: Diagnosis not present

## 2020-05-27 DIAGNOSIS — I10 Essential (primary) hypertension: Secondary | ICD-10-CM | POA: Diagnosis not present

## 2020-05-27 DIAGNOSIS — Z299 Encounter for prophylactic measures, unspecified: Secondary | ICD-10-CM | POA: Diagnosis not present

## 2020-05-27 DIAGNOSIS — S329XXA Fracture of unspecified parts of lumbosacral spine and pelvis, initial encounter for closed fracture: Secondary | ICD-10-CM | POA: Diagnosis not present

## 2020-05-28 DIAGNOSIS — Z79891 Long term (current) use of opiate analgesic: Secondary | ICD-10-CM | POA: Diagnosis not present

## 2020-05-28 DIAGNOSIS — Z7982 Long term (current) use of aspirin: Secondary | ICD-10-CM | POA: Diagnosis not present

## 2020-05-28 DIAGNOSIS — S32511D Fracture of superior rim of right pubis, subsequent encounter for fracture with routine healing: Secondary | ICD-10-CM | POA: Diagnosis not present

## 2020-05-28 DIAGNOSIS — I10 Essential (primary) hypertension: Secondary | ICD-10-CM | POA: Diagnosis not present

## 2020-05-28 DIAGNOSIS — S32591D Other specified fracture of right pubis, subsequent encounter for fracture with routine healing: Secondary | ICD-10-CM | POA: Diagnosis not present

## 2020-05-28 DIAGNOSIS — W19XXXD Unspecified fall, subsequent encounter: Secondary | ICD-10-CM | POA: Diagnosis not present

## 2020-05-31 DIAGNOSIS — S32511D Fracture of superior rim of right pubis, subsequent encounter for fracture with routine healing: Secondary | ICD-10-CM | POA: Diagnosis not present

## 2020-05-31 DIAGNOSIS — W19XXXD Unspecified fall, subsequent encounter: Secondary | ICD-10-CM | POA: Diagnosis not present

## 2020-05-31 DIAGNOSIS — Z79891 Long term (current) use of opiate analgesic: Secondary | ICD-10-CM | POA: Diagnosis not present

## 2020-05-31 DIAGNOSIS — I10 Essential (primary) hypertension: Secondary | ICD-10-CM | POA: Diagnosis not present

## 2020-05-31 DIAGNOSIS — Z7982 Long term (current) use of aspirin: Secondary | ICD-10-CM | POA: Diagnosis not present

## 2020-05-31 DIAGNOSIS — S32591D Other specified fracture of right pubis, subsequent encounter for fracture with routine healing: Secondary | ICD-10-CM | POA: Diagnosis not present

## 2020-06-04 DIAGNOSIS — S32511D Fracture of superior rim of right pubis, subsequent encounter for fracture with routine healing: Secondary | ICD-10-CM | POA: Diagnosis not present

## 2020-06-04 DIAGNOSIS — W19XXXD Unspecified fall, subsequent encounter: Secondary | ICD-10-CM | POA: Diagnosis not present

## 2020-06-04 DIAGNOSIS — Z7982 Long term (current) use of aspirin: Secondary | ICD-10-CM | POA: Diagnosis not present

## 2020-06-04 DIAGNOSIS — I10 Essential (primary) hypertension: Secondary | ICD-10-CM | POA: Diagnosis not present

## 2020-06-04 DIAGNOSIS — S32591D Other specified fracture of right pubis, subsequent encounter for fracture with routine healing: Secondary | ICD-10-CM | POA: Diagnosis not present

## 2020-06-04 DIAGNOSIS — Z79891 Long term (current) use of opiate analgesic: Secondary | ICD-10-CM | POA: Diagnosis not present

## 2020-06-05 DIAGNOSIS — S32511D Fracture of superior rim of right pubis, subsequent encounter for fracture with routine healing: Secondary | ICD-10-CM | POA: Diagnosis not present

## 2020-06-06 DIAGNOSIS — Z79891 Long term (current) use of opiate analgesic: Secondary | ICD-10-CM | POA: Diagnosis not present

## 2020-06-06 DIAGNOSIS — S32591D Other specified fracture of right pubis, subsequent encounter for fracture with routine healing: Secondary | ICD-10-CM | POA: Diagnosis not present

## 2020-06-06 DIAGNOSIS — S32511D Fracture of superior rim of right pubis, subsequent encounter for fracture with routine healing: Secondary | ICD-10-CM | POA: Diagnosis not present

## 2020-06-06 DIAGNOSIS — Z7982 Long term (current) use of aspirin: Secondary | ICD-10-CM | POA: Diagnosis not present

## 2020-06-06 DIAGNOSIS — W19XXXD Unspecified fall, subsequent encounter: Secondary | ICD-10-CM | POA: Diagnosis not present

## 2020-06-06 DIAGNOSIS — I10 Essential (primary) hypertension: Secondary | ICD-10-CM | POA: Diagnosis not present

## 2020-06-11 DIAGNOSIS — Z7982 Long term (current) use of aspirin: Secondary | ICD-10-CM | POA: Diagnosis not present

## 2020-06-11 DIAGNOSIS — S32591D Other specified fracture of right pubis, subsequent encounter for fracture with routine healing: Secondary | ICD-10-CM | POA: Diagnosis not present

## 2020-06-11 DIAGNOSIS — W19XXXD Unspecified fall, subsequent encounter: Secondary | ICD-10-CM | POA: Diagnosis not present

## 2020-06-11 DIAGNOSIS — Z79891 Long term (current) use of opiate analgesic: Secondary | ICD-10-CM | POA: Diagnosis not present

## 2020-06-11 DIAGNOSIS — S32511D Fracture of superior rim of right pubis, subsequent encounter for fracture with routine healing: Secondary | ICD-10-CM | POA: Diagnosis not present

## 2020-06-11 DIAGNOSIS — I10 Essential (primary) hypertension: Secondary | ICD-10-CM | POA: Diagnosis not present

## 2020-06-13 DIAGNOSIS — I10 Essential (primary) hypertension: Secondary | ICD-10-CM | POA: Diagnosis not present

## 2020-06-13 DIAGNOSIS — W19XXXD Unspecified fall, subsequent encounter: Secondary | ICD-10-CM | POA: Diagnosis not present

## 2020-06-13 DIAGNOSIS — Z79891 Long term (current) use of opiate analgesic: Secondary | ICD-10-CM | POA: Diagnosis not present

## 2020-06-13 DIAGNOSIS — S32511D Fracture of superior rim of right pubis, subsequent encounter for fracture with routine healing: Secondary | ICD-10-CM | POA: Diagnosis not present

## 2020-06-13 DIAGNOSIS — S32591D Other specified fracture of right pubis, subsequent encounter for fracture with routine healing: Secondary | ICD-10-CM | POA: Diagnosis not present

## 2020-06-13 DIAGNOSIS — Z7982 Long term (current) use of aspirin: Secondary | ICD-10-CM | POA: Diagnosis not present

## 2020-06-18 DIAGNOSIS — I10 Essential (primary) hypertension: Secondary | ICD-10-CM | POA: Diagnosis not present

## 2020-06-18 DIAGNOSIS — E7800 Pure hypercholesterolemia, unspecified: Secondary | ICD-10-CM | POA: Diagnosis not present

## 2020-06-20 DIAGNOSIS — R300 Dysuria: Secondary | ICD-10-CM | POA: Diagnosis not present

## 2020-06-20 DIAGNOSIS — K219 Gastro-esophageal reflux disease without esophagitis: Secondary | ICD-10-CM | POA: Diagnosis not present

## 2020-06-20 DIAGNOSIS — Z299 Encounter for prophylactic measures, unspecified: Secondary | ICD-10-CM | POA: Diagnosis not present

## 2020-06-20 DIAGNOSIS — I10 Essential (primary) hypertension: Secondary | ICD-10-CM | POA: Diagnosis not present

## 2020-07-05 DIAGNOSIS — Z299 Encounter for prophylactic measures, unspecified: Secondary | ICD-10-CM | POA: Diagnosis not present

## 2020-07-05 DIAGNOSIS — M25511 Pain in right shoulder: Secondary | ICD-10-CM | POA: Diagnosis not present

## 2020-07-05 DIAGNOSIS — R42 Dizziness and giddiness: Secondary | ICD-10-CM | POA: Diagnosis not present

## 2020-07-05 DIAGNOSIS — I10 Essential (primary) hypertension: Secondary | ICD-10-CM | POA: Diagnosis not present

## 2020-07-05 DIAGNOSIS — R350 Frequency of micturition: Secondary | ICD-10-CM | POA: Diagnosis not present

## 2020-07-22 DIAGNOSIS — I10 Essential (primary) hypertension: Secondary | ICD-10-CM | POA: Diagnosis not present

## 2020-07-22 DIAGNOSIS — I700 Atherosclerosis of aorta: Secondary | ICD-10-CM | POA: Diagnosis not present

## 2020-07-22 DIAGNOSIS — Z299 Encounter for prophylactic measures, unspecified: Secondary | ICD-10-CM | POA: Diagnosis not present

## 2020-07-24 DIAGNOSIS — M25511 Pain in right shoulder: Secondary | ICD-10-CM | POA: Diagnosis not present

## 2020-07-31 DIAGNOSIS — N3281 Overactive bladder: Secondary | ICD-10-CM | POA: Diagnosis not present

## 2020-07-31 DIAGNOSIS — M7918 Myalgia, other site: Secondary | ICD-10-CM | POA: Diagnosis not present

## 2020-08-01 DIAGNOSIS — I10 Essential (primary) hypertension: Secondary | ICD-10-CM | POA: Diagnosis not present

## 2020-08-01 DIAGNOSIS — E7800 Pure hypercholesterolemia, unspecified: Secondary | ICD-10-CM | POA: Diagnosis not present

## 2020-08-02 DIAGNOSIS — Z299 Encounter for prophylactic measures, unspecified: Secondary | ICD-10-CM | POA: Diagnosis not present

## 2020-08-02 DIAGNOSIS — D692 Other nonthrombocytopenic purpura: Secondary | ICD-10-CM | POA: Diagnosis not present

## 2020-08-02 DIAGNOSIS — I10 Essential (primary) hypertension: Secondary | ICD-10-CM | POA: Diagnosis not present

## 2020-08-06 DIAGNOSIS — M25611 Stiffness of right shoulder, not elsewhere classified: Secondary | ICD-10-CM | POA: Diagnosis not present

## 2020-08-06 DIAGNOSIS — M75101 Unspecified rotator cuff tear or rupture of right shoulder, not specified as traumatic: Secondary | ICD-10-CM | POA: Diagnosis not present

## 2020-08-06 DIAGNOSIS — R531 Weakness: Secondary | ICD-10-CM | POA: Diagnosis not present

## 2020-08-09 ENCOUNTER — Other Ambulatory Visit: Payer: Self-pay | Admitting: Internal Medicine

## 2020-08-09 DIAGNOSIS — Z1231 Encounter for screening mammogram for malignant neoplasm of breast: Secondary | ICD-10-CM

## 2020-08-12 DIAGNOSIS — M25611 Stiffness of right shoulder, not elsewhere classified: Secondary | ICD-10-CM | POA: Diagnosis not present

## 2020-08-12 DIAGNOSIS — R531 Weakness: Secondary | ICD-10-CM | POA: Diagnosis not present

## 2020-08-12 DIAGNOSIS — M75101 Unspecified rotator cuff tear or rupture of right shoulder, not specified as traumatic: Secondary | ICD-10-CM | POA: Diagnosis not present

## 2020-08-14 DIAGNOSIS — R531 Weakness: Secondary | ICD-10-CM | POA: Diagnosis not present

## 2020-08-14 DIAGNOSIS — M25611 Stiffness of right shoulder, not elsewhere classified: Secondary | ICD-10-CM | POA: Diagnosis not present

## 2020-08-14 DIAGNOSIS — M75101 Unspecified rotator cuff tear or rupture of right shoulder, not specified as traumatic: Secondary | ICD-10-CM | POA: Diagnosis not present

## 2020-08-21 DIAGNOSIS — J069 Acute upper respiratory infection, unspecified: Secondary | ICD-10-CM | POA: Diagnosis not present

## 2020-08-21 DIAGNOSIS — R42 Dizziness and giddiness: Secondary | ICD-10-CM | POA: Diagnosis not present

## 2020-08-21 DIAGNOSIS — Z299 Encounter for prophylactic measures, unspecified: Secondary | ICD-10-CM | POA: Diagnosis not present

## 2020-08-23 DIAGNOSIS — M25611 Stiffness of right shoulder, not elsewhere classified: Secondary | ICD-10-CM | POA: Diagnosis not present

## 2020-08-23 DIAGNOSIS — M75101 Unspecified rotator cuff tear or rupture of right shoulder, not specified as traumatic: Secondary | ICD-10-CM | POA: Diagnosis not present

## 2020-08-23 DIAGNOSIS — M25511 Pain in right shoulder: Secondary | ICD-10-CM | POA: Diagnosis not present

## 2020-08-23 DIAGNOSIS — R531 Weakness: Secondary | ICD-10-CM | POA: Diagnosis not present

## 2020-08-23 DIAGNOSIS — M25521 Pain in right elbow: Secondary | ICD-10-CM | POA: Diagnosis not present

## 2020-08-23 DIAGNOSIS — M25532 Pain in left wrist: Secondary | ICD-10-CM | POA: Diagnosis not present

## 2020-08-30 DIAGNOSIS — I10 Essential (primary) hypertension: Secondary | ICD-10-CM | POA: Diagnosis not present

## 2020-08-30 DIAGNOSIS — E7800 Pure hypercholesterolemia, unspecified: Secondary | ICD-10-CM | POA: Diagnosis not present

## 2020-09-23 DIAGNOSIS — J069 Acute upper respiratory infection, unspecified: Secondary | ICD-10-CM | POA: Diagnosis not present

## 2020-09-23 DIAGNOSIS — Z299 Encounter for prophylactic measures, unspecified: Secondary | ICD-10-CM | POA: Diagnosis not present

## 2020-10-01 DIAGNOSIS — I10 Essential (primary) hypertension: Secondary | ICD-10-CM | POA: Diagnosis not present

## 2020-10-01 DIAGNOSIS — E7800 Pure hypercholesterolemia, unspecified: Secondary | ICD-10-CM | POA: Diagnosis not present

## 2020-10-21 DIAGNOSIS — M818 Other osteoporosis without current pathological fracture: Secondary | ICD-10-CM | POA: Diagnosis not present

## 2020-10-21 DIAGNOSIS — M8440XA Pathological fracture, unspecified site, initial encounter for fracture: Secondary | ICD-10-CM | POA: Diagnosis not present

## 2020-10-28 DIAGNOSIS — Z299 Encounter for prophylactic measures, unspecified: Secondary | ICD-10-CM | POA: Diagnosis not present

## 2020-10-28 DIAGNOSIS — J069 Acute upper respiratory infection, unspecified: Secondary | ICD-10-CM | POA: Diagnosis not present

## 2020-10-28 DIAGNOSIS — J309 Allergic rhinitis, unspecified: Secondary | ICD-10-CM | POA: Diagnosis not present

## 2020-10-28 DIAGNOSIS — I10 Essential (primary) hypertension: Secondary | ICD-10-CM | POA: Diagnosis not present

## 2020-10-28 DIAGNOSIS — I700 Atherosclerosis of aorta: Secondary | ICD-10-CM | POA: Diagnosis not present

## 2020-10-31 DIAGNOSIS — I10 Essential (primary) hypertension: Secondary | ICD-10-CM | POA: Diagnosis not present

## 2020-10-31 DIAGNOSIS — E7800 Pure hypercholesterolemia, unspecified: Secondary | ICD-10-CM | POA: Diagnosis not present

## 2020-11-05 ENCOUNTER — Ambulatory Visit: Payer: Medicare Other

## 2020-11-22 DIAGNOSIS — I10 Essential (primary) hypertension: Secondary | ICD-10-CM | POA: Diagnosis not present

## 2020-11-22 DIAGNOSIS — Z299 Encounter for prophylactic measures, unspecified: Secondary | ICD-10-CM | POA: Diagnosis not present

## 2020-11-22 DIAGNOSIS — J069 Acute upper respiratory infection, unspecified: Secondary | ICD-10-CM | POA: Diagnosis not present

## 2020-12-05 DIAGNOSIS — S329XXA Fracture of unspecified parts of lumbosacral spine and pelvis, initial encounter for closed fracture: Secondary | ICD-10-CM | POA: Diagnosis not present

## 2020-12-05 DIAGNOSIS — Z299 Encounter for prophylactic measures, unspecified: Secondary | ICD-10-CM | POA: Diagnosis not present

## 2020-12-05 DIAGNOSIS — I10 Essential (primary) hypertension: Secondary | ICD-10-CM | POA: Diagnosis not present

## 2020-12-05 DIAGNOSIS — N390 Urinary tract infection, site not specified: Secondary | ICD-10-CM | POA: Diagnosis not present

## 2020-12-18 ENCOUNTER — Ambulatory Visit: Payer: Medicare Other

## 2020-12-30 ENCOUNTER — Encounter (HOSPITAL_BASED_OUTPATIENT_CLINIC_OR_DEPARTMENT_OTHER): Payer: Self-pay | Admitting: *Deleted

## 2021-01-07 DIAGNOSIS — I10 Essential (primary) hypertension: Secondary | ICD-10-CM | POA: Diagnosis not present

## 2021-01-07 DIAGNOSIS — Z299 Encounter for prophylactic measures, unspecified: Secondary | ICD-10-CM | POA: Diagnosis not present

## 2021-01-13 DIAGNOSIS — Z299 Encounter for prophylactic measures, unspecified: Secondary | ICD-10-CM | POA: Diagnosis not present

## 2021-01-13 DIAGNOSIS — H8110 Benign paroxysmal vertigo, unspecified ear: Secondary | ICD-10-CM | POA: Diagnosis not present

## 2021-01-13 DIAGNOSIS — I700 Atherosclerosis of aorta: Secondary | ICD-10-CM | POA: Diagnosis not present

## 2021-01-13 DIAGNOSIS — N390 Urinary tract infection, site not specified: Secondary | ICD-10-CM | POA: Diagnosis not present

## 2021-01-13 DIAGNOSIS — S329XXA Fracture of unspecified parts of lumbosacral spine and pelvis, initial encounter for closed fracture: Secondary | ICD-10-CM | POA: Diagnosis not present

## 2021-01-23 DIAGNOSIS — Z299 Encounter for prophylactic measures, unspecified: Secondary | ICD-10-CM | POA: Diagnosis not present

## 2021-01-23 DIAGNOSIS — I700 Atherosclerosis of aorta: Secondary | ICD-10-CM | POA: Diagnosis not present

## 2021-01-23 DIAGNOSIS — I10 Essential (primary) hypertension: Secondary | ICD-10-CM | POA: Diagnosis not present

## 2021-01-23 DIAGNOSIS — J069 Acute upper respiratory infection, unspecified: Secondary | ICD-10-CM | POA: Diagnosis not present

## 2021-02-13 DIAGNOSIS — Z7189 Other specified counseling: Secondary | ICD-10-CM | POA: Diagnosis not present

## 2021-02-13 DIAGNOSIS — E7800 Pure hypercholesterolemia, unspecified: Secondary | ICD-10-CM | POA: Diagnosis not present

## 2021-02-13 DIAGNOSIS — Z789 Other specified health status: Secondary | ICD-10-CM | POA: Diagnosis not present

## 2021-02-13 DIAGNOSIS — Z299 Encounter for prophylactic measures, unspecified: Secondary | ICD-10-CM | POA: Diagnosis not present

## 2021-02-13 DIAGNOSIS — Z79899 Other long term (current) drug therapy: Secondary | ICD-10-CM | POA: Diagnosis not present

## 2021-02-13 DIAGNOSIS — R5383 Other fatigue: Secondary | ICD-10-CM | POA: Diagnosis not present

## 2021-02-13 DIAGNOSIS — Z0000 Encounter for general adult medical examination without abnormal findings: Secondary | ICD-10-CM | POA: Diagnosis not present

## 2021-03-06 ENCOUNTER — Other Ambulatory Visit: Payer: Self-pay

## 2021-03-06 ENCOUNTER — Ambulatory Visit
Admission: RE | Admit: 2021-03-06 | Discharge: 2021-03-06 | Disposition: A | Discharge status: Home / Self Care | Payer: Medicare Other | Source: Ambulatory Visit | Attending: Internal Medicine | Admitting: Internal Medicine

## 2021-03-06 DIAGNOSIS — Z1231 Encounter for screening mammogram for malignant neoplasm of breast: Secondary | ICD-10-CM | POA: Diagnosis not present

## 2021-03-21 DIAGNOSIS — J019 Acute sinusitis, unspecified: Secondary | ICD-10-CM | POA: Diagnosis not present

## 2021-03-21 DIAGNOSIS — R42 Dizziness and giddiness: Secondary | ICD-10-CM | POA: Diagnosis not present

## 2021-03-21 DIAGNOSIS — Z299 Encounter for prophylactic measures, unspecified: Secondary | ICD-10-CM | POA: Diagnosis not present

## 2021-04-17 DIAGNOSIS — I10 Essential (primary) hypertension: Secondary | ICD-10-CM | POA: Diagnosis not present

## 2021-04-17 DIAGNOSIS — Z299 Encounter for prophylactic measures, unspecified: Secondary | ICD-10-CM | POA: Diagnosis not present

## 2021-04-17 DIAGNOSIS — J069 Acute upper respiratory infection, unspecified: Secondary | ICD-10-CM | POA: Diagnosis not present

## 2021-05-27 DIAGNOSIS — Z299 Encounter for prophylactic measures, unspecified: Secondary | ICD-10-CM | POA: Diagnosis not present

## 2021-05-27 DIAGNOSIS — I10 Essential (primary) hypertension: Secondary | ICD-10-CM | POA: Diagnosis not present

## 2021-05-27 DIAGNOSIS — J069 Acute upper respiratory infection, unspecified: Secondary | ICD-10-CM | POA: Diagnosis not present

## 2021-05-27 DIAGNOSIS — I700 Atherosclerosis of aorta: Secondary | ICD-10-CM | POA: Diagnosis not present

## 2021-06-23 NOTE — Progress Notes (Signed)
75 y.o. G45P2002 Widowed White or Caucasian Not Hispanic or Latino female here for annual exam.  H/O TAH for fiboids. H/O TVT.   She c/o a sore on her bottom on the left. Present for a year. No vaginal bleeding.   She c/o RLQ abdominal pain. She feels a bump in her groin on the right. This pain started in the last 2-3 months.   Normal BM, mostly every day. Typically loose, depending what she eats. Occasional constipation.   H/O OAB. She has urinary frequency and urgency at times. She has some urge incontinence with full bladder. Leaks ~1 x a week.   Her husband died of dementia earlier this year.    H/O pelvic floor pain. She had an ultrasound at [facility] in 10/21 for pain. Absent uterus, neither ovary was seen . She had an 8 x 14 mm avascular cyst.   She fell when walking fast last year and fractured her pelvis. She has been told she had osteoporosis. She is getting her bone densities with her primary. She will discuss this with her primary. She is on calcium and vit D. She is very active.     No LMP recorded. Patient has had a hysterectomy.          Sexually active: No.  The current method of family planning is status post hysterectomy.    Exercising: Yes.     Walking on treadmill Smoker:  no  Health Maintenance: Pap:  2016 per previous office note.  History of abnormal Pap:  no MMG:  03/06/21 density C Bi-rads 1 neg  BMD:   2021 with PCP  Colonoscopy: ~2015 in [place]:  unsure  Gardasil: n/a   reports that she has never smoked. She has never used smokeless tobacco. She reports that she does not drink alcohol and does not use drugs. 2 grown daughter. 3 grandchildren.   Past Medical History:  Diagnosis Date   Colon polyps    Depression    GERD (gastroesophageal reflux disease)    Hiatal hernia    Hypertension     Past Surgical History:  Procedure Laterality Date   ABDOMINAL HYSTERECTOMY     CHOLECYSTECTOMY     tension free transvaginal tape procedure       Current Outpatient Medications  Medication Sig Dispense Refill   Calcium Carbonate-Vitamin D 600-200 MG-UNIT TABS Take by mouth daily.     cholecalciferol (VITAMIN D) 1000 UNITS tablet Take 1,000 Units by mouth 2 (two) times daily.     esomeprazole (NEXIUM) 20 MG capsule Take 20 mg by mouth daily before breakfast. OTC     FLUoxetine HCl (PROZAC PO) Take by mouth.     hyoscyamine (LEVSIN SL) 0.125 MG SL tablet Take 1-2 tablets every 4-6 hours as needed for abdominal pain 60 tablet 3   lisinopril-hydrochlorothiazide (PRINZIDE,ZESTORETIC) 10-12.5 MG per tablet Take 1 tablet by mouth daily.      No current facility-administered medications for this visit.    Family History  Problem Relation Age of Onset   Aneurysm Mother    Heart disease Father    Diabetes Daughter        One has diabetes age 17. Other in good health    Review of Systems  Gastrointestinal:  Positive for abdominal pain.       RLQ pain   Genitourinary:        Left vulvar pain     Exam:   BP 130/88 (BP Location: Right Arm, Patient Position: Sitting,  Cuff Size: Normal)   Pulse 72   Resp 14   Ht 4' 9.5" (1.461 m)   Wt 108 lb (49 kg)   BMI 22.97 kg/m   Weight change: '@WEIGHTCHANGE'$ @ Height:   Height: 4' 9.5" (146.1 cm)  Ht Readings from Last 3 Encounters:  06/24/21 4' 9.5" (1.461 m)  05/13/20 '4\' 10"'$  (1.473 m)  12/26/18 '4\' 10"'$  (1.473 m)    General appearance: alert, cooperative and appears stated age Head: Normocephalic, without obvious abnormality, atraumatic Neck: no adenopathy, supple, symmetrical, trachea midline and thyroid normal to inspection and palpation Breasts: normal appearance, no masses or tenderness Abdomen: soft, non-tender; non distended,  no masses,  no organomegaly Extremities: extremities normal, atraumatic, no cyanosis or edema Skin: Skin color, texture, turgor normal. No rashes or lesions Lymph nodes: Cervical, supraclavicular, and axillary nodes normal. No abnormal inguinal nodes  palpated Neurologic: Grossly normal   Pelvic: External genitalia:  no lesions              Urethra:  normal appearing urethra with no masses, tenderness or lesions              Bartholins and Skenes: normal                 Vagina: normal appearing vagina with normal color and discharge, no lesions              Cervix: absent               Bimanual Exam:  Uterus:  uterus absent              Adnexa: no mass, fullness, tenderness               Rectovaginal: Confirms               Anus:  normal sphincter tone, no lesions  Pelvic floor: tender bilaterally  Gae Dry chaperoned for the exam.  1. Encounter for gynecological examination without abnormal finding No pap needed  Mammogram and colonoscopy UTD  2. Vaginal atrophy Discussed vaginal estrogen. She had burning with compounded estrogen cream in the past.  - estradiol (ESTRACE) 0.1 MG/GM vaginal cream; 1 gram vaginally and a pea sized amount topically qhs x 2 weeks, then change to twice weekly  Dispense: 42.5 g; Refill: 1  3. Vulvar atrophy - estradiol (ESTRACE) 0.1 MG/GM vaginal cream; 1 gram vaginally and a pea sized amount topically qhs x 2 weeks, then change to twice weekly  Dispense: 42.5 g; Refill: 1 -If her discomfort persists after using the estrogen for a while, recommended that she return  4. Pelvic floor dysfunction Tender to palpation, don't think it is causing her baseline pain Consider pelvic floor PT  5. History of pelvic fracture Strongly encouraged her to make sure her DEXA is up to date with her primary and to discuss possible treatment She is getting calcium and vit d  6. OAB (overactive bladder) Currently tolerable. She has myrbetriq which she occasionally takes.   7. RLQ abdominal pain No concerning findings on exam. Normal pelvic ultrasound last fall. F/U with primary   In addition to the breast and pelvic exam ~25 minutes was spent in counseling about vaginal/vulvar atrophy, vaginal estrogen,  reviewing the importance of treating osteoporosis and evaluation her/discussing her pain

## 2021-06-24 ENCOUNTER — Encounter: Payer: Self-pay | Admitting: Obstetrics and Gynecology

## 2021-06-24 ENCOUNTER — Ambulatory Visit (INDEPENDENT_AMBULATORY_CARE_PROVIDER_SITE_OTHER): Payer: Medicare Other | Admitting: Obstetrics and Gynecology

## 2021-06-24 ENCOUNTER — Other Ambulatory Visit: Payer: Self-pay

## 2021-06-24 VITALS — BP 130/88 | HR 72 | Resp 14 | Ht <= 58 in | Wt 108.0 lb

## 2021-06-24 DIAGNOSIS — N905 Atrophy of vulva: Secondary | ICD-10-CM

## 2021-06-24 DIAGNOSIS — N952 Postmenopausal atrophic vaginitis: Secondary | ICD-10-CM | POA: Diagnosis not present

## 2021-06-24 DIAGNOSIS — Z8781 Personal history of (healed) traumatic fracture: Secondary | ICD-10-CM | POA: Diagnosis not present

## 2021-06-24 DIAGNOSIS — R1031 Right lower quadrant pain: Secondary | ICD-10-CM | POA: Diagnosis not present

## 2021-06-24 DIAGNOSIS — Z01419 Encounter for gynecological examination (general) (routine) without abnormal findings: Secondary | ICD-10-CM

## 2021-06-24 DIAGNOSIS — N3281 Overactive bladder: Secondary | ICD-10-CM

## 2021-06-24 DIAGNOSIS — M6289 Other specified disorders of muscle: Secondary | ICD-10-CM

## 2021-06-24 MED ORDER — ESTRADIOL 0.1 MG/GM VA CREA: TOPICAL_CREAM | VAGINAL | 1 refills | Status: DC

## 2021-06-24 NOTE — Patient Instructions (Signed)

## 2021-07-02 DIAGNOSIS — Z299 Encounter for prophylactic measures, unspecified: Secondary | ICD-10-CM | POA: Diagnosis not present

## 2021-07-02 DIAGNOSIS — K219 Gastro-esophageal reflux disease without esophagitis: Secondary | ICD-10-CM | POA: Diagnosis not present

## 2021-07-02 DIAGNOSIS — J329 Chronic sinusitis, unspecified: Secondary | ICD-10-CM | POA: Diagnosis not present

## 2021-08-06 ENCOUNTER — Encounter: Payer: Self-pay | Admitting: Physician Assistant

## 2021-08-06 ENCOUNTER — Ambulatory Visit (INDEPENDENT_AMBULATORY_CARE_PROVIDER_SITE_OTHER): Payer: Medicare Other | Admitting: Physician Assistant

## 2021-08-06 VITALS — BP 140/78 | HR 60 | Ht <= 58 in | Wt 110.0 lb

## 2021-08-06 DIAGNOSIS — R1013 Epigastric pain: Secondary | ICD-10-CM

## 2021-08-06 DIAGNOSIS — K4090 Unilateral inguinal hernia, without obstruction or gangrene, not specified as recurrent: Secondary | ICD-10-CM

## 2021-08-06 NOTE — Patient Instructions (Signed)
Increase Omeprazole 40 mg twice daily for 2 weeks.   Referral placed to [facility].   You have been scheduled for a Barium Esophogram at [facility] floor of the hospital) on Wednesday 08/13/21 at 10 am. Please arrive 15 minutes prior to your appointment for registration. Make certain not to have anything to eat or drink 3 hours prior to your test. If you need to reschedule for any reason, please contact radiology at [phone] to do so. __________________________________________________________________ A barium swallow is an examination that concentrates on views of the esophagus. This tends to be a double contrast exam (barium and two liquids which, when combined, create a gas to distend the wall of the oesophagus) or single contrast (non-ionic iodine based). The study is usually tailored to your symptoms so a good history is essential. Attention is paid during the study to the form, structure and configuration of the esophagus, looking for functional disorders (such as aspiration, dysphagia, achalasia, motility and reflux) EXAMINATION You may be asked to change into a gown, depending on the type of swallow being performed. A radiologist and radiographer will perform the procedure. The radiologist will advise you of the type of contrast selected for your procedure and direct you during the exam. You will be asked to stand, sit or lie in several different positions and to hold a small amount of fluid in your mouth before being asked to swallow while the imaging is performed .In some instances you may be asked to swallow barium coated marshmallows to assess the motility of a solid food bolus. The exam can be recorded as a digital or video fluoroscopy procedure. POST PROCEDURE It will take 1-2 days for the barium to pass through your system. To facilitate this, it is important, unless otherwise directed, to increase your fluids for the next 24-48hrs and to resume your normal  diet.  This test typically takes about 30 minutes to perform. ___________________________________________________________________  Janet Bailey have been scheduled for an Upper GI Series at [facility]. Your appointment is on Monday 08/18/21 at 9. Please arrive 15 minutes prior to your test for registration. Make sure not to eat or drink anything after midnight on the night before your test. If you need to reschedule, please call radiology at [phone]. ________________________________________________________________ An upper GI series uses x rays to help diagnose problems of the upper GI tract, which includes the esophagus, stomach, and duodenum. The duodenum is the first part of the small intestine. An upper GI series is conducted by a radiology technologist or a radiologist--a doctor who specializes in x-ray imaging--at a hospital or outpatient center. While sitting or standing in front of an x-ray machine, the patient drinks barium liquid, which is often white and has a chalky consistency and taste. The barium liquid coats the lining of the upper GI tract and makes signs of disease show up more clearly on x rays. X-ray video, called fluoroscopy, is used to view the barium liquid moving through the esophagus, stomach, and duodenum. Additional x rays and fluoroscopy are performed while the patient lies on an x-ray table. To fully coat the upper GI tract with barium liquid, the technologist or radiologist may press on the abdomen or ask the patient to change position. Patients hold still in various positions, allowing the technologist or radiologist to take x rays of the upper GI tract at different angles. If a technologist conducts the upper GI series, a radiologist will later examine the images to look for problems.  This  test typically takes about 1 hour to complete. __________________________________________________________________

## 2021-08-06 NOTE — Progress Notes (Signed)
Noted  

## 2021-08-06 NOTE — Progress Notes (Signed)
Opened in error

## 2021-08-06 NOTE — Progress Notes (Signed)
Chief Complaint: Right upper quadrant pain  HPI:    Janet Bailey is a 75 year old female with a past medical history of irritable bowel syndrome and reflux, known to Dr. Henrene Pastor, who presents to clinic today with a complaint of right upper quadrant pain.    03/2015 EGD and colonoscopy.  Colonoscopy was said to be complete with excellent preparation.  Colon was entirely normal.  Biopsies microscopic colitis were normal.  EGD was normal except for antral erythema and benign reactive changes in this area.  No H. pylori.    11/16/2018 CT of the abdomen pelvis was normal.    12/26/2018 patient seen in clinic by Dr. Henrene Pastor for multiple chronic abdominal complaints.  Reported a personal history of IBS.  Apparently difficulties with constipation for which she uses laxatives.  Also intermittent problems with abdominal pain.  Describing status postcholecystectomy.  Also with chronic reflux for which she takes Nexium.  Had recently seen her PCP who treated her empirically for diverticulitis.  At that time recommended Metamucil 2 tablespoons daily and started on hyoscyamine 0.125 mg 1-2 sublingual tabs every 4-6 hours as needed.    Today, the patient presents to clinic and explains 2 separate GI issues.  The first is that she is experiencing epigastric pain/pressure which is constant made worse by eating.  Also describes an increase in coughing and a feeling of being strangled especially if she bends over after eating.  Initially she was taking Omeprazole 20 mg at night, she increase this to twice daily over the past month but does not feel like it has helped symptoms at all.  Denies dysphagia.    Also discusses a "knot" in her right lower quadrant which she feels more when she is standing up, if she pushes on this area it is slightly tender, but does not hurt or if she is not touching it.  No change in bowel habits.    Denies fever, chills, weight loss, blood in her stool or symptoms that awaken her from sleep.      Past Medical History:  Diagnosis Date   Colon polyps    Depression    GERD (gastroesophageal reflux disease)    Hiatal hernia    Hypertension     Past Surgical History:  Procedure Laterality Date   ABDOMINAL HYSTERECTOMY     CHOLECYSTECTOMY     tension free transvaginal tape procedure      Current Outpatient Medications  Medication Sig Dispense Refill   Calcium Carbonate-Vitamin D 600-200 MG-UNIT TABS Take by mouth daily.     cholecalciferol (VITAMIN D) 1000 UNITS tablet Take 1,000 Units by mouth 2 (two) times daily.     esomeprazole (NEXIUM) 20 MG capsule Take 20 mg by mouth daily before breakfast. OTC     estradiol (ESTRACE) 0.1 MG/GM vaginal cream 1 gram vaginally and a pea sized amount topically qhs x 2 weeks, then change to twice weekly 42.5 g 1   FLUoxetine HCl (PROZAC PO) Take by mouth.     hyoscyamine (LEVSIN SL) 0.125 MG SL tablet Take 1-2 tablets every 4-6 hours as needed for abdominal pain 60 tablet 3   lisinopril-hydrochlorothiazide (PRINZIDE,ZESTORETIC) 10-12.5 MG per tablet Take 1 tablet by mouth daily.      No current facility-administered medications for this visit.    Allergies as of 08/06/2021   (No Known Allergies)    Family History  Problem Relation Age of Onset   Aneurysm Mother    Heart disease Father    Diabetes  Daughter        One has diabetes age 61. Other in good health    Social History   Socioeconomic History   Marital status: Married    Spouse name: Not on file   Number of children: Not on file   Years of education: Not on file   Highest education level: Not on file  Occupational History   Not on file  Tobacco Use   Smoking status: Never   Smokeless tobacco: Never  Vaping Use   Vaping Use: Never used  Substance and Sexual Activity   Alcohol use: No   Drug use: No   Sexual activity: Not Currently    Comment: 1st intercourse 58 yo-1 partner  Other Topics Concern   Not on file  Social History Narrative   ** Merged History  Encounter **       Social Determinants of Health   Financial Resource Strain: Not on file  Food Insecurity: Not on file  Transportation Needs: Not on file  Physical Activity: Not on file  Stress: Not on file  Social Connections: Not on file  Intimate Partner Violence: Not on file    Review of Systems:    Constitutional: No weight loss, fever or chills Cardiovascular: No chest pain, chest pressure or palpitations   Respiratory: No SOB  Gastrointestinal: See HPI and otherwise negative   Physical Exam:  Vital signs: BP 140/78   Pulse 60   Ht 4' 9.5" (1.461 m)   Wt 110 lb (49.9 kg)   SpO2 93%   BMI 23.39 kg/m    Constitutional:   Pleasant Elderly Caucasian female appears to be in NAD, Well developed, Well nourished, alert and cooperative  Respiratory: Respirations even and unlabored. Lungs clear to auscultation bilaterally.   No wheezes, crackles, or rhonchi.  Cardiovascular: Normal S1, S2. No MRG. Regular rate and rhythm. No peripheral edema, cyanosis or pallor.  Gastrointestinal:  Soft, nondistended, mild epigastric TTP,+ right inguinal hernia slightly uncomfortable to deep palpation, easily reduced, felt better when the patient is standing up. No rebound or guarding. Normal bowel sounds. No appreciable masses or hepatomegaly. Rectal:  Not performed.  Psychiatric:  Demonstrates good judgement and reason without abnormal affect or behaviors.  We do not have recent labs or imaging  Assessment: 1.  Epigastric pain: More of a pressure, constant but worse after eating, history of reflux, unchanged with increase in Omeprazole to 20 mg twice daily over the past month; consider gastritis+ esophagitis most likely 2.  Right inguinal hernia: Felt on exam today  Plan: 1.  Discussed with patient that typically they would not proceed with surgical intervention as long as the hernia is not painful to her and it is reducing.  She would like referral to the surgical team anyways.  Referred  her to [facility] surgical team to discuss this right inguinal hernia further. 2.  Patient asked if she will need another colonoscopy.  Explained that her last one in 2016 was normal and she will likely not need to repeat for screening purposes. 3.  Scheduled patient for barium swallow with upper GI study for further eval of pain which she is discussing now. 4.  Increased patient's Omeprazole to 40 mg twice daily, 30-60 minutes before breakfast and dinner.  Patient is nervous about taking a high dose and recommend she take for 2 weeks to see if she sees any change in symptoms. 5.  Patient to follow in clinic per recommendations after imaging above.  Ellouise Newer,  PA-C [facility] 08/06/2021, 1:41 PM  Cc: Glenda Chroman, MD

## 2021-08-08 DIAGNOSIS — Z299 Encounter for prophylactic measures, unspecified: Secondary | ICD-10-CM | POA: Diagnosis not present

## 2021-08-08 DIAGNOSIS — R42 Dizziness and giddiness: Secondary | ICD-10-CM | POA: Diagnosis not present

## 2021-08-08 DIAGNOSIS — J329 Chronic sinusitis, unspecified: Secondary | ICD-10-CM | POA: Diagnosis not present

## 2021-08-13 ENCOUNTER — Other Ambulatory Visit: Payer: Self-pay | Admitting: Physician Assistant

## 2021-08-13 ENCOUNTER — Ambulatory Visit (HOSPITAL_COMMUNITY)
Admission: RE | Admit: 2021-08-13 | Discharge: 2021-08-13 | Disposition: A | Discharge status: Home / Self Care | Payer: Medicare Other | Source: Ambulatory Visit | Attending: Physician Assistant | Admitting: Physician Assistant

## 2021-08-13 DIAGNOSIS — R1013 Epigastric pain: Secondary | ICD-10-CM | POA: Insufficient documentation

## 2021-08-13 DIAGNOSIS — K4090 Unilateral inguinal hernia, without obstruction or gangrene, not specified as recurrent: Secondary | ICD-10-CM | POA: Insufficient documentation

## 2021-08-13 DIAGNOSIS — K224 Dyskinesia of esophagus: Secondary | ICD-10-CM | POA: Diagnosis not present

## 2021-08-13 DIAGNOSIS — K219 Gastro-esophageal reflux disease without esophagitis: Secondary | ICD-10-CM | POA: Diagnosis not present

## 2021-08-14 DIAGNOSIS — Z299 Encounter for prophylactic measures, unspecified: Secondary | ICD-10-CM | POA: Diagnosis not present

## 2021-08-14 DIAGNOSIS — I700 Atherosclerosis of aorta: Secondary | ICD-10-CM | POA: Diagnosis not present

## 2021-08-14 DIAGNOSIS — I10 Essential (primary) hypertension: Secondary | ICD-10-CM | POA: Diagnosis not present

## 2021-08-14 DIAGNOSIS — Z6821 Body mass index (BMI) 21.0-21.9, adult: Secondary | ICD-10-CM | POA: Diagnosis not present

## 2021-08-18 ENCOUNTER — Other Ambulatory Visit (HOSPITAL_COMMUNITY): Payer: Medicare Other

## 2021-08-22 ENCOUNTER — Ambulatory Visit (HOSPITAL_COMMUNITY)
Admission: RE | Admit: 2021-08-22 | Discharge: 2021-08-22 | Disposition: A | Discharge status: Home / Self Care | Payer: Medicare Other | Source: Ambulatory Visit | Attending: Physician Assistant | Admitting: Physician Assistant

## 2021-08-22 ENCOUNTER — Other Ambulatory Visit: Payer: Self-pay

## 2021-08-22 DIAGNOSIS — R1013 Epigastric pain: Secondary | ICD-10-CM | POA: Insufficient documentation

## 2021-08-22 DIAGNOSIS — K4090 Unilateral inguinal hernia, without obstruction or gangrene, not specified as recurrent: Secondary | ICD-10-CM | POA: Insufficient documentation

## 2021-09-03 ENCOUNTER — Telehealth: Payer: Self-pay | Admitting: Physician Assistant

## 2021-09-03 NOTE — Telephone Encounter (Signed)
[facility] office to ask if we can send them a referral mentioned on last visit.

## 2021-09-04 NOTE — Telephone Encounter (Signed)
Faxed referral to [facility] for possible inguinal hernia repair. Patient notified.

## 2021-09-22 DIAGNOSIS — K4090 Unilateral inguinal hernia, without obstruction or gangrene, not specified as recurrent: Secondary | ICD-10-CM | POA: Diagnosis not present

## 2021-09-22 DIAGNOSIS — K449 Diaphragmatic hernia without obstruction or gangrene: Secondary | ICD-10-CM | POA: Diagnosis not present

## 2021-09-22 DIAGNOSIS — H9190 Unspecified hearing loss, unspecified ear: Secondary | ICD-10-CM | POA: Diagnosis not present

## 2021-09-22 DIAGNOSIS — K219 Gastro-esophageal reflux disease without esophagitis: Secondary | ICD-10-CM | POA: Diagnosis not present

## 2021-09-22 DIAGNOSIS — R140 Abdominal distension (gaseous): Secondary | ICD-10-CM | POA: Diagnosis not present

## 2021-09-22 DIAGNOSIS — K580 Irritable bowel syndrome with diarrhea: Secondary | ICD-10-CM | POA: Diagnosis not present

## 2021-09-22 DIAGNOSIS — R1310 Dysphagia, unspecified: Secondary | ICD-10-CM | POA: Diagnosis not present

## 2021-10-06 DIAGNOSIS — J329 Chronic sinusitis, unspecified: Secondary | ICD-10-CM | POA: Diagnosis not present

## 2021-10-06 DIAGNOSIS — Z299 Encounter for prophylactic measures, unspecified: Secondary | ICD-10-CM | POA: Diagnosis not present

## 2021-10-06 DIAGNOSIS — Z6821 Body mass index (BMI) 21.0-21.9, adult: Secondary | ICD-10-CM | POA: Diagnosis not present

## 2021-10-13 ENCOUNTER — Ambulatory Visit: Payer: Self-pay | Admitting: Surgery

## 2021-10-22 DIAGNOSIS — K4020 Bilateral inguinal hernia, without obstruction or gangrene, not specified as recurrent: Secondary | ICD-10-CM | POA: Diagnosis not present

## 2021-10-22 DIAGNOSIS — K4190 Unilateral femoral hernia, without obstruction or gangrene, not specified as recurrent: Secondary | ICD-10-CM | POA: Diagnosis not present

## 2021-10-22 DIAGNOSIS — K4090 Unilateral inguinal hernia, without obstruction or gangrene, not specified as recurrent: Secondary | ICD-10-CM | POA: Diagnosis not present

## 2021-11-04 DIAGNOSIS — H04123 Dry eye syndrome of bilateral lacrimal glands: Secondary | ICD-10-CM | POA: Diagnosis not present

## 2021-11-04 DIAGNOSIS — H2513 Age-related nuclear cataract, bilateral: Secondary | ICD-10-CM | POA: Diagnosis not present

## 2021-11-12 ENCOUNTER — Encounter: Payer: Self-pay | Admitting: Internal Medicine

## 2021-11-12 ENCOUNTER — Ambulatory Visit: Payer: Medicare Other | Admitting: Internal Medicine

## 2021-11-12 VITALS — BP 110/70 | HR 69 | Ht <= 58 in | Wt 122.0 lb

## 2021-11-12 DIAGNOSIS — R1013 Epigastric pain: Secondary | ICD-10-CM

## 2021-11-12 DIAGNOSIS — K219 Gastro-esophageal reflux disease without esophagitis: Secondary | ICD-10-CM | POA: Diagnosis not present

## 2021-11-12 DIAGNOSIS — K449 Diaphragmatic hernia without obstruction or gangrene: Secondary | ICD-10-CM

## 2021-11-12 NOTE — Patient Instructions (Signed)
If you are age 77 or older, your body mass index should be between 23-30. Your Body mass index is 26.4 kg/m. If this is out of the aforementioned range listed, please consider follow up with your Primary Care Provider.  If you are age 62 or younger, your body mass index should be between 19-25. Your Body mass index is 26.4 kg/m. If this is out of the aformentioned range listed, please consider follow up with your Primary Care Provider.   ________________________________________________________  The [facility] providers would like to encourage you to use [facility] to communicate with providers for non-urgent requests or questions.  Due to long hold times on the telephone, sending your provider a message by [facility] may be a faster and more efficient way to get a response.  Please allow 48 business hours for a response.  Please remember that this is for non-urgent requests.  _______________________________________________________  I will call you to schedule your endoscopy

## 2021-11-12 NOTE — Progress Notes (Signed)
HISTORY OF PRESENT ILLNESS:  Janet Bailey is a 76 y.o. female who I saw in February 2020 as a new patient for chronic abdominal pain with intermittent loose stools felt to be irritable bowel syndrome.  See that dictation for details.  She is status postcholecystectomy and hysterectomy.  She also has a history of GERD.  She was evaluated by the GI physician assistant August 06, 2021 regarding epigastric pain.  At that time she was noted to have a right inguinal hernia which was symptomatic.  She was sent to general surgery regarding her inguinal hernia and subsequently underwent repair October 22, 2021 with Dr. Johney Maine.  At the time of her office visit her omeprazole was increased to 40 mg twice daily.  Regarding her epigastric pain, CT scan January 2020 revealed small hiatal hernia.  She was sent for an esophagram to evaluate dysphagia, epigastric pain, and reflux.  She was found to have a moderate sized paraesophageal hernia which only opacified with the patient supine.  In addition a small hiatal hernia with moderate reflux noted.  Subsequent upper GI series revealed the moderate sliding hiatal hernia.  A surgical opinion was requested regarding the paraesophageal hernia with possible obstructive symptoms.  Reviewing the surgical consultation note, there was the recognition of hiatal hernia and reflux.  However, not clear that the study with the paraesophageal hernia was noted.  Patient presents today for follow-up.  She is recovering from her inguinal hernia surgery slowly but surely.  Increased dose of PPI as addressed classic reflux symptoms.  She tells me that she eats carefully.  No vomiting.  She is anticipating cataract surgery next month.  No prior colonoscopy or upper endoscopy here.  She did undergo both examinations in [place] in May 2016.  Upper endoscopy was unremarkable colonoscopy was normal.  REVIEW OF SYSTEMS:  All non-GI ROS negative unless otherwise stated in the HPI except for  cataracts  Past Medical History:  Diagnosis Date   Colon polyps    Depression    GERD (gastroesophageal reflux disease)    Hiatal hernia    Hypertension     Past Surgical History:  Procedure Laterality Date   ABDOMINAL HYSTERECTOMY     CHOLECYSTECTOMY     INGUINAL HERNIA REPAIR     tension free transvaginal tape procedure      Social History Janet Bailey  reports that she has never smoked. She has never used smokeless tobacco. She reports that she does not drink alcohol and does not use drugs.  family history includes Aneurysm in her mother; Colon cancer in her paternal uncle; Diabetes in her daughter; Heart disease in her father.  No Known Allergies     PHYSICAL EXAMINATION: Vital signs: BP 110/70    Pulse 69    Ht 4\' 9"  (1.448 m)    Wt 122 lb (55.3 kg)    SpO2 96%    BMI 26.40 kg/m   Constitutional: generally well-appearing, no acute distress Psychiatric: alert and oriented x3, cooperative Eyes: extraocular movements intact, anicteric, conjunctiva pink Mouth: oral pharynx moist, no lesions Neck: supple no lymphadenopathy Cardiovascular: heart regular rate and rhythm, no murmur Lungs: clear to auscultation bilaterally Abdomen: soft, nontender, nondistended, no obvious ascites, no peritoneal signs, normal bowel sounds, no organomegaly Rectal: Omitted Extremities: no clubbing, cyanosis, or lower extremity edema bilaterally Skin: no lesions on visible extremities Neuro: No focal deficits.  Cranial nerves intact  ASSESSMENT:  1.  GERD 2.  Paraesophageal hernia and hiatal hernia.  Suggestion  of obstructive component on barium esophagram.  Unremarkable EGD 2016, save small hiatal hernia. 3.  Normal colonoscopy 2016 4.  Status post inguinal hernia repair  PLAN:  1.  Reflux precautions 2.  Continue PPI 3.  Upper endoscopy to further evaluate hiatal hernia with paraesophageal component.The nature of the procedure, as well as the risks, benefits, and alternatives  were carefully and thoroughly reviewed with the patient. Ample time for discussion and questions allowed. The patient understood, was satisfied, and agreed to proceed. 4.  Recommendations thereafter to be determined.  I discussed with her issues related to paraesophageal hernia versus simple sliding hiatal hernia.

## 2021-11-19 DIAGNOSIS — E039 Hypothyroidism, unspecified: Secondary | ICD-10-CM | POA: Diagnosis not present

## 2021-11-19 DIAGNOSIS — Z299 Encounter for prophylactic measures, unspecified: Secondary | ICD-10-CM | POA: Diagnosis not present

## 2021-11-19 DIAGNOSIS — J069 Acute upper respiratory infection, unspecified: Secondary | ICD-10-CM | POA: Diagnosis not present

## 2021-11-19 DIAGNOSIS — R6889 Other general symptoms and signs: Secondary | ICD-10-CM | POA: Diagnosis not present

## 2021-11-19 DIAGNOSIS — R110 Nausea: Secondary | ICD-10-CM | POA: Diagnosis not present

## 2021-12-01 ENCOUNTER — Encounter: Payer: Self-pay | Admitting: Obstetrics and Gynecology

## 2021-12-01 DIAGNOSIS — N905 Atrophy of vulva: Secondary | ICD-10-CM

## 2021-12-01 DIAGNOSIS — N952 Postmenopausal atrophic vaginitis: Secondary | ICD-10-CM

## 2021-12-01 DIAGNOSIS — R002 Palpitations: Secondary | ICD-10-CM | POA: Diagnosis not present

## 2021-12-01 DIAGNOSIS — I700 Atherosclerosis of aorta: Secondary | ICD-10-CM | POA: Diagnosis not present

## 2021-12-01 DIAGNOSIS — Z299 Encounter for prophylactic measures, unspecified: Secondary | ICD-10-CM | POA: Diagnosis not present

## 2021-12-01 DIAGNOSIS — I10 Essential (primary) hypertension: Secondary | ICD-10-CM | POA: Diagnosis not present

## 2021-12-03 ENCOUNTER — Other Ambulatory Visit: Payer: Self-pay | Admitting: Obstetrics and Gynecology

## 2021-12-03 DIAGNOSIS — N905 Atrophy of vulva: Secondary | ICD-10-CM

## 2021-12-03 DIAGNOSIS — N952 Postmenopausal atrophic vaginitis: Secondary | ICD-10-CM

## 2021-12-03 NOTE — Telephone Encounter (Signed)
Patient sent My Chart message with this request in it. It is pending.

## 2021-12-04 DIAGNOSIS — H2512 Age-related nuclear cataract, left eye: Secondary | ICD-10-CM | POA: Diagnosis not present

## 2021-12-04 MED ORDER — ESTRADIOL 0.1 MG/GM VA CREA: TOPICAL_CREAM | VAGINAL | 0 refills | Status: DC

## 2021-12-04 NOTE — Telephone Encounter (Signed)
I'm worried she is using a lot of the estrogen cream. If she is only irritated externally, she should just use the cream there. It isn't really meant for daily use. Please ask how she is using it.

## 2021-12-05 ENCOUNTER — Telehealth: Payer: Self-pay

## 2021-12-05 DIAGNOSIS — R002 Palpitations: Secondary | ICD-10-CM | POA: Diagnosis not present

## 2021-12-05 NOTE — Telephone Encounter (Signed)
Spoke with patient and she reports using cream vaginally and externally as well. Reports if she doesn't it "bothers her"  I suggested she schedule OV for exam. Message sent to appointments to schedule.  Just FYI

## 2021-12-06 ENCOUNTER — Emergency Department (HOSPITAL_BASED_OUTPATIENT_CLINIC_OR_DEPARTMENT_OTHER)
Admission: EM | Admit: 2021-12-06 | Discharge: 2021-12-07 | Disposition: A | Discharge status: Home / Self Care | Payer: Medicare Other | Attending: Emergency Medicine | Admitting: Emergency Medicine

## 2021-12-06 ENCOUNTER — Encounter (HOSPITAL_BASED_OUTPATIENT_CLINIC_OR_DEPARTMENT_OTHER): Payer: Self-pay | Admitting: *Deleted

## 2021-12-06 ENCOUNTER — Other Ambulatory Visit: Payer: Self-pay

## 2021-12-06 DIAGNOSIS — I493 Ventricular premature depolarization: Secondary | ICD-10-CM | POA: Insufficient documentation

## 2021-12-06 DIAGNOSIS — I10 Essential (primary) hypertension: Secondary | ICD-10-CM | POA: Insufficient documentation

## 2021-12-06 DIAGNOSIS — R002 Palpitations: Secondary | ICD-10-CM

## 2021-12-06 DIAGNOSIS — D649 Anemia, unspecified: Secondary | ICD-10-CM | POA: Diagnosis not present

## 2021-12-06 DIAGNOSIS — Z79899 Other long term (current) drug therapy: Secondary | ICD-10-CM | POA: Insufficient documentation

## 2021-12-06 DIAGNOSIS — E876 Hypokalemia: Secondary | ICD-10-CM | POA: Insufficient documentation

## 2021-12-06 DIAGNOSIS — R079 Chest pain, unspecified: Secondary | ICD-10-CM

## 2021-12-06 DIAGNOSIS — R0789 Other chest pain: Secondary | ICD-10-CM | POA: Diagnosis not present

## 2021-12-06 DIAGNOSIS — T502X5A Adverse effect of carbonic-anhydrase inhibitors, benzothiadiazides and other diuretics, initial encounter: Secondary | ICD-10-CM

## 2021-12-06 NOTE — ED Triage Notes (Addendum)
Daughter states ongoing palpations for the past couple months. Was seen at the MD on Monday and had a monitor placed (will wear for 10 days) , and blood work completed. Tonight was having chest pain, left arm pain, and PVCs. C/o having a "funny feeling" Pt states she felt like her chest was "beating fast" c/o of feeling sob. C/o upper back pain. Took 4 baby ASA pta.

## 2021-12-07 ENCOUNTER — Emergency Department (HOSPITAL_BASED_OUTPATIENT_CLINIC_OR_DEPARTMENT_OTHER): Payer: Medicare Other

## 2021-12-07 DIAGNOSIS — R079 Chest pain, unspecified: Secondary | ICD-10-CM | POA: Diagnosis not present

## 2021-12-07 LAB — BASIC METABOLIC PANEL
Anion gap: 8 (ref 5–15)
BUN: 9 mg/dL (ref 8–23)
CO2: 30 mmol/L (ref 22–32)
Calcium: 9 mg/dL (ref 8.9–10.3)
Chloride: 97 mmol/L — ABNORMAL LOW (ref 98–111)
Creatinine, Ser: 0.47 mg/dL (ref 0.44–1.00)
GFR, Estimated: >60 mL/min (ref >60)
Glucose, Bld: 101 mg/dL — ABNORMAL HIGH (ref 70–99)
Potassium: 3.1 mmol/L — ABNORMAL LOW (ref 3.5–5.1)
Sodium: 135 mmol/L (ref 135–145)

## 2021-12-07 LAB — CBC
HCT: 33.9 % — ABNORMAL LOW (ref 36.0–46.0)
Hemoglobin: 11.5 g/dL — ABNORMAL LOW (ref 12.0–15.0)
MCH: 31.3 pg (ref 26.0–34.0)
MCHC: 33.9 g/dL (ref 30.0–36.0)
MCV: 92.4 fL (ref 80.0–100.0)
Platelets: 275 10*3/uL (ref 150–400)
RBC: 3.67 MIL/uL — ABNORMAL LOW (ref 3.87–5.11)
RDW: 12.6 % (ref 11.5–15.5)
WBC: 6.3 10*3/uL (ref 4.0–10.5)
nRBC: 0 % (ref 0.0–0.2)

## 2021-12-07 LAB — TROPONIN I (HIGH SENSITIVITY)
Troponin I (High Sensitivity): 8 ng/L (ref <18)
Troponin I (High Sensitivity): 9 ng/L (ref <18)

## 2021-12-07 LAB — MAGNESIUM: Magnesium: 1.9 mg/dL (ref 1.7–2.4)

## 2021-12-07 MED ORDER — POTASSIUM CHLORIDE ER 10 MEQ PO TBCR: 10.0000 meq | EXTENDED_RELEASE_TABLET | Freq: Three times a day (TID) | ORAL | 0 refills | Status: DC

## 2021-12-07 MED ORDER — POTASSIUM CHLORIDE CRYS ER 20 MEQ PO TBCR
40.0000 meq | EXTENDED_RELEASE_TABLET | Freq: Once | ORAL | Status: AC
  Administered 2021-12-07: 40 meq via ORAL
  Filled 2021-12-07: qty 2

## 2021-12-07 MED ORDER — SLOW-MAG 71.5-119 MG PO TBEC: 1.0000 | DELAYED_RELEASE_TABLET | Freq: Two times a day (BID) | ORAL | 0 refills | Status: DC

## 2021-12-07 NOTE — ED Provider Notes (Signed)
[facility] EMERGENCY DEPT Provider Note   CSN: [id number] Arrival date & time: 12/06/21  2318     History  Chief Complaint  Patient presents with   Chest Pain    Janet Bailey is a 76 y.o. female.  The history is provided by the patient and a relative.  Chest Pain She has history of hypertension, GERD and comes in because of an episode of palpitations and chest discomfort.  She went up a flight of steps and noted that her heart was racing.  She then developed a pain in her chest which radiated to the back.  She told her daughter about that who checked her pulse and noted that it was irregular.  Her and her daughter checked her heart rhythm by putting her smart watch on her and noted ventricular bigeminy.  They gave her 4 aspirin tablets, and took her to the emergency department.  Patient states that she is no longer having any palpitations.  She is continuing to complain of some very mild pain in her back and is not clear if this was the original pain that she had been having at home.  She had been complaining of palpitations over the last several months, and had seen her primary care provider who did an ECG and blood work told her that her sodium was low but everything else was okay.  She has been given a Zio patch which she is wearing.  She is a non-smoker and there is no history of diabetes or hyperlipidemia.   Home Medications Prior to Admission medications   Medication Sig Start Date End Date Taking? Authorizing Provider  busPIRone (BUSPAR) 10 MG tablet Take 10 mg by mouth 2 (two) times daily. 12/04/21   [provider]  Calcium Carbonate-Vitamin D 600-200 MG-UNIT TABS Take by mouth daily.    [provider]  cholecalciferol (VITAMIN D) 1000 UNITS tablet Take 1,000 Units by mouth 2 (two) times daily.    [provider]  estradiol (ESTRACE) 0.1 MG/GM vaginal cream 1 gram vaginally and a pea sized amount topically qhs x 2 weeks, then change to  twice weekly 12/04/21   Salvadore Dom, MD  FLUoxetine (PROZAC) 20 MG capsule Take 20 mg by mouth daily. 12/01/21   [provider]  lisinopril-hydrochlorothiazide (PRINZIDE,ZESTORETIC) 10-12.5 MG per tablet Take 1 tablet by mouth daily.  08/10/13   [provider]  omeprazole (PRILOSEC) 20 MG capsule Take 20 mg by mouth 2 (two) times daily before a meal.    [provider]      Allergies    Patient has no known allergies.    Review of Systems   Review of Systems  Cardiovascular:  Positive for chest pain.  All other systems reviewed and are negative.  Physical Exam Updated Vital Signs BP (!) 154/73    Pulse 61    Temp 98.1 F (36.7 C)    Resp 14    Ht 4\' 9"  (1.448 m)    Wt 50.8 kg    SpO2 98%    BMI 24.24 kg/m  Physical Exam Vitals and nursing note reviewed.  76 year old female, resting comfortably and in no acute distress. Vital signs are significant for elevated blood pressure. Oxygen saturation is 98%, which is normal. Head is normocephalic and atraumatic. PERRLA, EOMI. Oropharynx is clear. Neck is nontender and supple without adenopathy or JVD. Back is nontender and there is no CVA tenderness. Lungs are clear without rales, wheezes, or rhonchi. Chest  is nontender. Heart has regular rate and rhythm without murmur. Abdomen is soft, flat, nontender. Extremities have no cyanosis or edema, full range of motion is present. Skin is warm and dry without rash. Neurologic: Mental status is normal, cranial nerves are intact, moves all extremities equally.  ED Results / Procedures / Treatments   Labs (all labs ordered are listed, but only abnormal results are displayed) Labs Reviewed  BASIC METABOLIC PANEL - Abnormal; Notable for the following components:      Result Value   Potassium 3.1 (*)    Chloride 97 (*)    Glucose, Bld 101 (*)    All other components within normal limits  CBC - Abnormal; Notable for the following components:   RBC 3.67 (*)     Hemoglobin 11.5 (*)    HCT 33.9 (*)    All other components within normal limits  MAGNESIUM  TROPONIN I (HIGH SENSITIVITY)  TROPONIN I (HIGH SENSITIVITY)    EKG EKG Interpretation  Date/Time:  Saturday December 06 2021 23:27:50 EST Ventricular Rate:  63 PR Interval:  178 QRS Duration: 92 QT Interval:  456 QTC Calculation: 466 R Axis:   15 Text Interpretation: Normal sinus rhythm Cannot rule out Anterior infarct , age undetermined Abnormal ECG No previous ECGs available Confirmed by Delora Fuel ([id number]) on 12/06/2021 11:42:08 PM  Radiology DG Chest Port 1 View  Result Date: 12/07/2021 CLINICAL DATA:  Chest pain EXAM: PORTABLE CHEST 1 VIEW COMPARISON:  None. FINDINGS: Heart is normal size. Linear areas of scarring in the lower lungs. No acute confluent opacities or effusions. No acute bony abnormality. IMPRESSION: No active disease. Electronically Signed   By: Rolm Baptise M.D.   On: 12/07/2021 01:05    Procedures Procedures  Cardiac monitor shows normal sinus rhythm per my interpretation.  Medications Ordered in ED Medications  potassium chloride SA (KLOR-CON M) CR tablet 40 mEq (40 mEq Oral Given 12/07/21 0242)    ED Course/ Medical Decision Making/ A&P                           Medical Decision Making Amount and/or Complexity of Data Reviewed Labs: ordered. Radiology: ordered.  Risk OTC drugs. Prescription drug management.   Episode of palpitations and chest discomfort.  ECG here shows no ST or T changes, no prior ECGs available for comparison.  Chest x-ray shows no active disease.  I have independently viewed the images, and agree with the radiologist's interpretation.  Initial troponin is normal, but potassium is noted to be low at 3.1.  This would certainly put her at increased risk for ventricular arrhythmias and she is given a dose of oral potassium.  It is noted that she is taking a diuretic for her hypertension.  We will need to check magnesium level.  We will also  need to repeat troponin level.  Mild anemia is present which is actually improved compared with value seen in care everywhere from July 2021.  There is also concern that she may be having paroxysmal atrial fibrillation, but this would be seen when her Zio patch is analyzed.  Old records are reviewed, and she has no relevant past visits in our system.  Repeat troponin is normal.  Magnesium is in the normal range, but slightly lower than optimal.  She is discharged with prescriptions for K-Dur and Slow-Mag.  She will need to discuss with her primary care provider whether to send her Zio patch and since she  clinically had the arrhythmia that they were evaluating.  Return precautions discussed       Final Clinical Impression(s) / ED Diagnoses Final diagnoses:  Nonspecific chest pain  Palpitations  PVC's (premature ventricular contractions)  Diuretic-induced hypokalemia  Normochromic normocytic anemia    Rx / DC Orders ED Discharge Orders          Ordered    potassium chloride (KLOR-CON) 10 MEQ tablet  3 times daily        12/07/21 0342    magnesium chloride (SLOW-MAG) 64 MG TBEC SR tablet  2 times daily        12/07/21 7185              Delora Fuel, MD 50/15/86 0345

## 2021-12-07 NOTE — Discharge Instructions (Signed)
Please discuss with your primary care provider whether to keep the Zio patch on for the full 10 days or to send it in.  Return if you are having any problems at home.

## 2021-12-08 DIAGNOSIS — I10 Essential (primary) hypertension: Secondary | ICD-10-CM | POA: Diagnosis not present

## 2021-12-08 DIAGNOSIS — Z299 Encounter for prophylactic measures, unspecified: Secondary | ICD-10-CM | POA: Diagnosis not present

## 2021-12-08 DIAGNOSIS — E440 Moderate protein-calorie malnutrition: Secondary | ICD-10-CM | POA: Diagnosis not present

## 2021-12-08 DIAGNOSIS — I700 Atherosclerosis of aorta: Secondary | ICD-10-CM | POA: Diagnosis not present

## 2021-12-08 NOTE — Telephone Encounter (Signed)
Patient scheduled on 12/09/21

## 2021-12-09 ENCOUNTER — Encounter: Payer: Self-pay | Admitting: Obstetrics and Gynecology

## 2021-12-09 ENCOUNTER — Ambulatory Visit: Payer: Medicare Other | Admitting: Cardiovascular Disease

## 2021-12-09 ENCOUNTER — Encounter: Payer: Self-pay | Admitting: Cardiovascular Disease

## 2021-12-09 ENCOUNTER — Other Ambulatory Visit: Payer: Self-pay

## 2021-12-09 ENCOUNTER — Ambulatory Visit: Payer: Medicare Other | Admitting: Obstetrics and Gynecology

## 2021-12-09 ENCOUNTER — Telehealth: Payer: Self-pay

## 2021-12-09 VITALS — BP 126/82 | HR 64 | Wt 112.0 lb

## 2021-12-09 VITALS — BP 122/70 | HR 85 | Ht <= 58 in | Wt 113.4 lb

## 2021-12-09 DIAGNOSIS — N94819 Vulvodynia, unspecified: Secondary | ICD-10-CM

## 2021-12-09 DIAGNOSIS — M6289 Other specified disorders of muscle: Secondary | ICD-10-CM | POA: Diagnosis not present

## 2021-12-09 DIAGNOSIS — I493 Ventricular premature depolarization: Secondary | ICD-10-CM

## 2021-12-09 DIAGNOSIS — R0602 Shortness of breath: Secondary | ICD-10-CM

## 2021-12-09 DIAGNOSIS — R3915 Urgency of urination: Secondary | ICD-10-CM

## 2021-12-09 DIAGNOSIS — R002 Palpitations: Secondary | ICD-10-CM

## 2021-12-09 DIAGNOSIS — R5383 Other fatigue: Secondary | ICD-10-CM | POA: Diagnosis not present

## 2021-12-09 LAB — WET PREP FOR TRICH, YEAST, CLUE

## 2021-12-09 MED ORDER — LIDOCAINE 5 % EX OINT: 1.0000 "application " | TOPICAL_OINTMENT | Freq: Four times a day (QID) | CUTANEOUS | 1 refills | Status: DC | PRN

## 2021-12-09 MED ORDER — LISINOPRIL 20 MG PO TABS: 20.0000 mg | ORAL_TABLET | Freq: Every day | ORAL | 3 refills | Status: DC

## 2021-12-09 NOTE — Telephone Encounter (Signed)
Salvadore Dom, MD  [facility] Triage [facility]

## 2021-12-09 NOTE — Progress Notes (Signed)
GYNECOLOGY  VISIT   HPI: 76 y.o.   Married White or Caucasian Not Hispanic or Latino  female   [phone] with No LMP recorded. Patient has had a hysterectomy.   here for pain on the out side of her labia . She states that it feels like knot  Estrogen cream helps some, so she was using it too much.  Hurts to sit down and sometimes when she lays down. No vaginal bulge.  She does c/o bladder pressure, urinary frequency and urgency. No dysuria.   GYNECOLOGIC HISTORY: No LMP recorded. Patient has had a hysterectomy. Contraception:PMP Menopausal hormone therapy: estradiol         OB History     Gravida  2   Para  2   Term  2   Preterm  0   AB  0   Living  2      SAB  0   IAB  0   Ectopic  0   Multiple      Live Births  2              Patient Active Problem List   Diagnosis Date Noted   Pelvic fracture ([place]) 05/15/2020   Closed fracture of right hip ([place]) 05/14/2020   Hyponatremia 05/14/2020   Hypotension 05/14/2020   Leukocytosis 05/14/2020   GERD (gastroesophageal reflux disease) 02/26/2014   Essential hypertension, benign 02/26/2014   Abdominal pain, right upper quadrant 02/26/2014   Abdominal pain, chronic, right lower quadrant 02/26/2014    Past Medical History:  Diagnosis Date   Colon polyps    Depression    GERD (gastroesophageal reflux disease)    Hiatal hernia    Hypertension     Past Surgical History:  Procedure Laterality Date   ABDOMINAL HYSTERECTOMY     CHOLECYSTECTOMY     INGUINAL HERNIA REPAIR     tension free transvaginal tape procedure      Current Outpatient Medications  Medication Sig Dispense Refill   busPIRone (BUSPAR) 10 MG tablet Take 10 mg by mouth 2 (two) times daily.     Calcium Carbonate-Vitamin D 600-200 MG-UNIT TABS Take by mouth daily.     cholecalciferol (VITAMIN D) 1000 UNITS tablet Take 1,000 Units by mouth 2 (two) times daily.     estradiol (ESTRACE) 0.1 MG/GM vaginal cream 1 gram vaginally and a pea sized  amount topically qhs x 2 weeks, then change to twice weekly 42.5 g 0   FLUoxetine (PROZAC) 20 MG capsule Take 20 mg by mouth daily.     lisinopril-hydrochlorothiazide (PRINZIDE,ZESTORETIC) 10-12.5 MG per tablet Take 1 tablet by mouth daily.      magnesium chloride (SLOW-MAG) 64 MG TBEC SR tablet Take 1 tablet (64 mg total) by mouth 2 (two) times daily. 30 tablet 0   omeprazole (PRILOSEC) 20 MG capsule Take 20 mg by mouth 2 (two) times daily before a meal.     potassium chloride (KLOR-CON) 10 MEQ tablet Take 1 tablet (10 mEq total) by mouth 3 (three) times daily. 90 tablet 0   No current facility-administered medications for this visit.     ALLERGIES: Patient has no known allergies.  Family History  Problem Relation Age of Onset   Aneurysm Mother    Heart disease Father    Diabetes Daughter        One has diabetes age 90. Other in good health   Colon cancer Paternal Uncle     Social History   Socioeconomic History   Marital  status: Married    Spouse name: Not on file   Number of children: 2   Years of education: Not on file   Highest education level: Not on file  Occupational History   Not on file  Tobacco Use   Smoking status: Never   Smokeless tobacco: Never  Vaping Use   Vaping Use: Never used  Substance and Sexual Activity   Alcohol use: No   Drug use: No   Sexual activity: Not Currently    Comment: 1st intercourse 59 yo-1 partner  Other Topics Concern   Not on file  Social History Narrative   ** Merged History Encounter **       Social Determinants of Health   Financial Resource Strain: Not on file  Food Insecurity: Not on file  Transportation Needs: Not on file  Physical Activity: Not on file  Stress: Not on file  Social Connections: Not on file  Intimate Partner Violence: Not on file    ROS  PHYSICAL EXAMINATION:    BP 126/82    Pulse 64    Wt 112 lb (50.8 kg)    SpO2 99%    BMI 24.24 kg/m     General appearance: alert, cooperative and appears  stated age  Pelvic: External genitalia:  no lesions, tender to palpation around the vestibule with a cotton swab with lubrication              Urethra:  normal appearing urethra with no masses, tenderness or lesions              Bartholins and Skenes: normal                 Vagina: mildly atrophic appearing vagina with normal color and discharge, no lesions              Cervix: absent              Bimanual Exam:  Uterus:  uterus absent              Adnexa: no mass, fullness, tenderness              Pelvic floor: very tender bilaterally  Chaperone was present for exam.  1. Vulvodynia - WET PREP FOR TRICH, YEAST, CLUE: negative - lidocaine (XYLOCAINE) 5 % ointment; Apply 1 application topically 4 (four) times daily as needed. Apply a pea sized amount  Dispense: 30 g; Refill: 1 - Ambulatory referral to Physical Therapy  2. Pelvic floor dysfunction - Ambulatory referral to Physical Therapy  3. Urinary urgency - Urinalysis, Complete: 0-5 WBC, 0-2 RBC - Urine Culture

## 2021-12-09 NOTE — Telephone Encounter (Signed)
Referral request for PT faxed to [facility].

## 2021-12-09 NOTE — Progress Notes (Signed)
Cardiology Office Note:   Date:  12/09/2021  NAME:  Janet Bailey    MRN: [id number] DOB:  [birth year]   PCP:  Glenda Chroman, MD  Cardiologist:  None  Electrophysiologist:  None   Referring MD: Medicine, [facility]   Chief Complaint  Patient presents with   Palpitations        History of Present Illness:   Janet Bailey is a 76 y.o. female with a hx of acid reflux, hypertension, depression who is being seen today for the evaluation of palpitations/PVCs at the request of Jerene Bears B, MD. she presents with her daughter.  Apparently since September 2022 she has had episodes of fluttering in her chest.  She also reports shortness of breath as well as fatigue.  She is felt like she was going to pass out several times.  Her heart races several times per day.  Her daughter is a Marine scientist and captured PVCs on a cardia mobile device.  She did complete a monitor through her primary care physician we are awaiting the results.  She reports no energy.  She was seen in the emergency room on 12/07/2021 for palpitations.  Monitor there was normal.  EKG was normal.  She was noticeably hypokalemic and had a low magnesium.  This was replaced.  Of note she is on hydrochlorothiazide which will lower her potassium.  She reports she is continue to have symptoms and was sent for our evaluation.  EKG in office demonstrates sinus rhythm with PVCs.  She appears to be in bigeminy.  By the time I examined her her PVCs had stopped.  This could represent PACs with aberrant conduction.  We did discuss that in the office.  I did review strips of the cardia mobile.  There is no atrial fibrillation.  Recent lab data largely normal except for low potassium.  Her TSH was normal.  She has no evidence of congestive heart failure.  She reports no chest pain or pressure.  She does report low energy and palpitations.  Her husband did die last year.  She is approaching the 1 year anniversary.  She does report she is depressed but  symptoms appear to be stable.  There is no strong family history of heart disease.  She has never had a heart attack or stroke.  She does not smoke.  No alcohol or drug use is reported.  We did discuss neck steps which will include an echocardiogram as well as stress test.  We also need to see the results of the monitor from her primary care physician.  TSH 1.16 T chol 171, HDL 41, LDL 99, triglycerides 181, hemoglobin 12.8, creatinine 0.55  Problem List HTN Depression  GERD  Past Medical History: Past Medical History:  Diagnosis Date   Colon polyps    Depression    GERD (gastroesophageal reflux disease)    Hiatal hernia    Hypertension     Past Surgical History: Past Surgical History:  Procedure Laterality Date   ABDOMINAL HYSTERECTOMY     cataract surgery     CHOLECYSTECTOMY     INGUINAL HERNIA REPAIR     tension free transvaginal tape procedure      Current Medications: Current Meds  Medication Sig   busPIRone (BUSPAR) 10 MG tablet Take 10 mg by mouth 2 (two) times daily.   Calcium Carbonate-Vitamin D 600-200 MG-UNIT TABS Take by mouth daily.   cholecalciferol (VITAMIN D) 1000 UNITS tablet Take 1,000 Units by mouth 2 (two)  times daily.   estradiol (ESTRACE) 0.1 MG/GM vaginal cream 1 gram vaginally and a pea sized amount topically qhs x 2 weeks, then change to twice weekly   FLUoxetine (PROZAC) 20 MG capsule Take 20 mg by mouth daily.   lisinopril (ZESTRIL) 20 MG tablet Take 1 tablet (20 mg total) by mouth daily.   magnesium chloride (SLOW-MAG) 64 MG TBEC SR tablet Take 1 tablet (64 mg total) by mouth 2 (two) times daily.   omeprazole (PRILOSEC) 20 MG capsule Take 20 mg by mouth 2 (two) times daily before a meal.   [DISCONTINUED] lisinopril-hydrochlorothiazide (PRINZIDE,ZESTORETIC) 10-12.5 MG per tablet Take 1 tablet by mouth daily.    [DISCONTINUED] potassium chloride (KLOR-CON) 10 MEQ tablet Take 1 tablet (10 mEq total) by mouth 3 (three) times daily.     Allergies:     Patient has no known allergies.   Social History: Social History   Socioeconomic History   Marital status: Widowed    Spouse name: Not on file   Number of children: 2   Years of education: Not on file   Highest education level: Not on file  Occupational History   Occupation: retired  Tobacco Use   Smoking status: Never   Smokeless tobacco: Never  Vaping Use   Vaping Use: Never used  Substance and Sexual Activity   Alcohol use: No   Drug use: No   Sexual activity: Not Currently    Comment: 1st intercourse 45 yo-1 partner  Other Topics Concern   Not on file  Social History Narrative   ** Merged History Encounter **       Social Determinants of Health   Financial Resource Strain: Not on file  Food Insecurity: Not on file  Transportation Needs: Not on file  Physical Activity: Not on file  Stress: Not on file  Social Connections: Not on file     Family History: The patient's family history includes Aneurysm in her mother; Arrhythmia in her sister; Colon cancer in her paternal uncle; Diabetes in her daughter; Heart disease in her father and sister.  ROS:   All other ROS reviewed and negative. Pertinent positives noted in the HPI.     EKGs/Labs/Other Studies Reviewed:   The following studies were personally reviewed by me today:  EKG:  EKG is ordered today.  The ekg ordered today demonstrates normal sinus rhythm heart rate 85, PACs with aberrant conduction noted, and was personally reviewed by me.   Recent Labs: 12/07/2021: BUN 9; Creatinine, Ser 0.47; Hemoglobin 11.5; Magnesium 1.9; Platelets 275; Potassium 3.1; Sodium 135   Recent Lipid Panel No results found for: CHOL, TRIG, HDL, CHOLHDL, VLDL, LDLCALC, LDLDIRECT  Physical Exam:   VS:  BP 122/70 (BP Location: Left Arm, Patient Position: Sitting, Cuff Size: Normal)    Pulse 85    Ht 4\' 9"  (1.448 m)    Wt 113 lb 6.4 oz (51.4 kg)    SpO2 92%    BMI 24.54 kg/m    Wt Readings from Last 3 Encounters:  12/09/21 113 lb  6.4 oz (51.4 kg)  12/09/21 112 lb (50.8 kg)  12/06/21 112 lb (50.8 kg)    General: Well nourished, well developed, in no acute distress Head: Atraumatic, normal size  Eyes: PEERLA, EOMI  Neck: Supple, no JVD Endocrine: No thryomegaly Cardiac: Normal S1, S2; RRR; no murmurs, rubs, or gallops Lungs: Clear to auscultation bilaterally, no wheezing, rhonchi or rales  Abd: Soft, nontender, no hepatomegaly  Ext: No edema, pulses 2+ Musculoskeletal:  No deformities, BUE and BLE strength normal and equal Skin: Warm and dry, no rashes   Neuro: Alert and oriented to person, place, time, and situation, CNII-XII grossly intact, no focal deficits  Psych: Normal mood and affect   ASSESSMENT:   Janet Bailey is a 76 y.o. female who presents for the following: 1. Palpitations   2. PVC (premature ventricular contraction)   3. SOB (shortness of breath)   4. Other fatigue     PLAN:   1. Palpitations 2. PVC (premature ventricular contraction) 3. SOB (shortness of breath) 4. Other fatigue -She presents with symptoms of palpitations and found to have what I suspect are PACs with aberrant conduction versus PVCs. TSH normal. I did review her cardia mobile device which shows no evidence of A-fib.  This is all coincided with the death of her husband last year.  And also coincides with depression.  I do wonder if this is contributing.  Regardless she was seen in the emergency room and she was hypokalemic.  Hypokalemia will clearly cause ventricular ectopy.  We discussed stopping her HCTZ and increasing her lisinopril to 20 mg daily.  I would also like to proceed with an echocardiogram to make sure her heart is structurally normal.  Given her fatigue and shortness of breath we will exclude ischemia with a nuclear medicine stress test.  She did have a CT abdomen pelvis in 2020 which showed evidence of coronary calcium.  She really describes no symptoms concerning for angina.  If her symptoms do not improve in  the next 2 to 3 days off HCTZ we will add a beta-blocker.  I have asked her daughter to let us know.  She also completed a monitor with her primary care physician.  We will need to see the results of that including any burden of ectopy.  Her daughter will get the results for [place].    Shared Decision Making/Informed Consent The risks [chest pain, shortness of breath, cardiac arrhythmias, dizziness, blood pressure fluctuations, myocardial infarction, stroke/transient ischemic attack, nausea, vomiting, allergic reaction, radiation exposure, metallic taste sensation and life-threatening complications (estimated to be 1 in 10,000)], benefits (risk stratification, diagnosing coronary artery disease, treatment guidance) and alternatives of a nuclear stress test were discussed in detail with Ms. Cheyney and she agrees to proceed.  Disposition: Return in about 6 months (around 06/08/2022).  Medication Adjustments/Labs and Tests Ordered: Current medicines are reviewed at length with the patient today.  Concerns regarding medicines are outlined above.  Orders Placed This Encounter  Procedures   NM Myocar Single W/Spect W/Wall Motion And EF   Cardiac Stress Test: Informed Consent Details: Physician/Practitioner Attestation; Transcribe to consent form and obtain patient signature   MYOCARDIAL PERFUSION IMAGING   EKG 12-Lead   ECHOCARDIOGRAM COMPLETE   Meds ordered this encounter  Medications   lisinopril (ZESTRIL) 20 MG tablet    Sig: Take 1 tablet (20 mg total) by mouth daily.    Dispense:  90 tablet    Refill:  3    Patient Instructions  Medication Instructions:  STOP Lisinopril/HCTZ STOP Potassium   START Lisinopril 20 mg daily   *If you need a refill on your cardiac medications before your next appointment, please call your pharmacy*   Testing/Procedures: Echocardiogram - Your physician has requested that you have an echocardiogram. Echocardiography is a painless test that uses sound waves to  create images of your heart. It provides your doctor with information about the size and shape of your heart and  how well your hearts chambers and valves are working. This procedure takes approximately one hour. There are no restrictions for this procedure. This will be performed at either our [facility] location - [address], [place] location [facility] 2nd floor.  Your physician has requested that you have a lexiscan myoview. For further information please visit [email or web address]. Please follow instruction sheet, as given.    Follow-Up: At [facility], you and your health needs are our priority.  As part of our continuing mission to provide you with exceptional heart care, we have created designated Provider Care Teams.  These Care Teams include your primary Cardiologist (physician) and Advanced Practice Providers (APPs -  Physician Assistants and Nurse Practitioners) who all work together to provide you with the care you need, when you need it.  We recommend signing up for the patient portal called "MyChart".  Sign up information is provided on this After Visit Summary.  MyChart is used to connect with patients for Virtual Visits (Telemedicine).  Patients are able to view lab/test results, encounter notes, upcoming appointments, etc.  Non-urgent messages can be sent to your provider as well.   To learn more about what you can do with MyChart, go to [email or web address].    Your next appointment:   6 month(s)  The format for your next appointment:   In Person  Provider:   Eleonore Chiquito, MD    Other Instructions We will request records from your primary care provider-     Signed, Addison Naegeli. Audie Box, MD, [place]  [address] [place], [place] [address] [phone]  12/09/2021 5:00 PM

## 2021-12-09 NOTE — Patient Instructions (Signed)
Medication Instructions:  STOP Lisinopril/HCTZ STOP Potassium   START Lisinopril 20 mg daily   *If you need a refill on your cardiac medications before your next appointment, please call your pharmacy*   Testing/Procedures: Echocardiogram - Your physician has requested that you have an echocardiogram. Echocardiography is a painless test that uses sound waves to create images of your heart. It provides your doctor with information about the size and shape of your heart and how well your hearts chambers and valves are working. This procedure takes approximately one hour. There are no restrictions for this procedure. This will be performed at either our [facility] location - [address] [place] location [facility] 2nd floor.  Your physician has requested that you have a lexiscan myoview. For further information please visit [email or web address]. Please follow instruction sheet, as given.    Follow-Up: At [facility], you and your health needs are our priority.  As part of our continuing mission to provide you with exceptional heart care, we have created designated Provider Care Teams.  These Care Teams include your primary Cardiologist (physician) and Advanced Practice Providers (APPs -  Physician Assistants and Nurse Practitioners) who all work together to provide you with the care you need, when you need it.  We recommend signing up for the patient portal called "MyChart".  Sign up information is provided on this After Visit Summary.  MyChart is used to connect with patients for Virtual Visits (Telemedicine).  Patients are able to view lab/test results, encounter notes, upcoming appointments, etc.  Non-urgent messages can be sent to your provider as well.   To learn more about what you can do with MyChart, go to [email or web address].    Your next appointment:   6 month(s)  The format for your next appointment:   In Person  Provider:   Eleonore Chiquito, MD    Other Instructions We will request records from your primary care provider-

## 2021-12-11 ENCOUNTER — Ambulatory Visit: Payer: Medicare Other | Admitting: Cardiovascular Disease

## 2021-12-11 LAB — URINALYSIS, COMPLETE W/RFL CULTURE
Bacteria, UA: NONE SEEN /HPF
Bilirubin Urine: NEGATIVE
Casts: NONE SEEN /LPF
Crystals: NONE SEEN /HPF
Glucose, UA: NEGATIVE
Hyaline Cast: NONE SEEN /LPF
Ketones, ur: NEGATIVE
Leukocyte Esterase: NEGATIVE
Nitrites, Initial: NEGATIVE
Protein, ur: NEGATIVE
Specific Gravity, Urine: 1.01 (ref 1.001–1.035)
Yeast: NONE SEEN /HPF
pH: 6.5 (ref 5.0–8.0)

## 2021-12-11 LAB — URINE CULTURE
MICRO NUMBER:: 12974544
Result:: NO GROWTH
SPECIMEN QUALITY:: ADEQUATE

## 2021-12-11 LAB — CULTURE INDICATED

## 2021-12-12 ENCOUNTER — Encounter: Payer: Self-pay | Admitting: Cardiovascular Disease

## 2021-12-12 DIAGNOSIS — H2511 Age-related nuclear cataract, right eye: Secondary | ICD-10-CM | POA: Diagnosis not present

## 2021-12-12 MED ORDER — METOPROLOL SUCCINATE ER 25 MG PO TB24: 25.0000 mg | ORAL_TABLET | Freq: Every day | ORAL | 1 refills | Status: DC

## 2021-12-12 NOTE — Telephone Encounter (Signed)
Spoke to patient advised to continue Lisinopril 20 mg daily and start Metoprolol Succinate 25 mg daily.Advised to call back if continues to have palpitations.

## 2021-12-15 ENCOUNTER — Telehealth: Payer: Self-pay | Admitting: Internal Medicine

## 2021-12-15 DIAGNOSIS — R002 Palpitations: Secondary | ICD-10-CM | POA: Diagnosis not present

## 2021-12-15 NOTE — Telephone Encounter (Signed)
The pt has an EGD on 3/3 and would like some advice for GERD in the meantime.  She is taking prilosec BID.  We discussed anti reflux precautions and that she should be taking her PPI 20-30 min prior to meals.  She was also advised to try Pepcid at bedtime.  She will try the recommendations and keep appt as planned for EGD.

## 2021-12-15 NOTE — Telephone Encounter (Signed)
Patient called because she has not heard from [facility] yet. I called her and provided the phone number so that she can call and schedule appt. Referral was sent 6 days ago and I let her know that they will have it.

## 2021-12-15 NOTE — Telephone Encounter (Signed)
Patient called moved her EGD up to 01/02/22 but she is requesting to speak with a nurse regarding having a lot of Gerd symptoms and is seeking advise.

## 2021-12-17 NOTE — Telephone Encounter (Signed)
Urology said as of today they are "working on referral"

## 2021-12-19 ENCOUNTER — Other Ambulatory Visit (HOSPITAL_COMMUNITY): Payer: Medicare Other

## 2021-12-19 DIAGNOSIS — D692 Other nonthrombocytopenic purpura: Secondary | ICD-10-CM | POA: Diagnosis not present

## 2021-12-19 DIAGNOSIS — I700 Atherosclerosis of aorta: Secondary | ICD-10-CM | POA: Diagnosis not present

## 2021-12-19 DIAGNOSIS — Z299 Encounter for prophylactic measures, unspecified: Secondary | ICD-10-CM | POA: Diagnosis not present

## 2021-12-19 DIAGNOSIS — I471 Supraventricular tachycardia: Secondary | ICD-10-CM | POA: Diagnosis not present

## 2021-12-19 DIAGNOSIS — I10 Essential (primary) hypertension: Secondary | ICD-10-CM | POA: Diagnosis not present

## 2021-12-24 ENCOUNTER — Telehealth (HOSPITAL_COMMUNITY): Payer: Self-pay

## 2021-12-24 NOTE — Telephone Encounter (Signed)
Spoke with the patient, detailed instructions given. She stated that she would be here for her test. Asked to call back with any questions. Janet Bailey EMTP 

## 2021-12-25 ENCOUNTER — Encounter (HOSPITAL_COMMUNITY): Payer: Medicare Other

## 2021-12-26 NOTE — Telephone Encounter (Signed)
error 

## 2021-12-30 ENCOUNTER — Ambulatory Visit (HOSPITAL_COMMUNITY): Payer: Medicare Other | Attending: Cardiovascular Disease

## 2021-12-30 ENCOUNTER — Other Ambulatory Visit: Payer: Self-pay

## 2021-12-30 DIAGNOSIS — R0602 Shortness of breath: Secondary | ICD-10-CM | POA: Diagnosis not present

## 2021-12-30 LAB — MYOCARDIAL PERFUSION IMAGING
LV dias vol: 61 mL (ref 46–106)
LV sys vol: 17 mL
Nuc Stress EF: 72 %
Peak HR: 77 {beats}/min
Rest HR: 52 {beats}/min
Rest Nuclear Isotope Dose: 10.2 mCi
SDS: 3
SRS: 0
SSS: 3
ST Depression (mm): 0 mm
Stress Nuclear Isotope Dose: 32.6 mCi
TID: 0.99

## 2021-12-30 MED ORDER — TECHNETIUM TC 99M TETROFOSMIN IV KIT
10.2000 | PACK | Freq: Once | INTRAVENOUS | Status: AC | PRN
  Administered 2021-12-30: 10.2 via INTRAVENOUS
  Filled 2021-12-30: qty 11

## 2021-12-30 MED ORDER — REGADENOSON 0.4 MG/5ML IV SOLN
0.4000 mg | Freq: Once | INTRAVENOUS | Status: AC
  Administered 2021-12-30: 0.4 mg via INTRAVENOUS

## 2021-12-30 MED ORDER — TECHNETIUM TC 99M TETROFOSMIN IV KIT
32.6000 | PACK | Freq: Once | INTRAVENOUS | Status: AC | PRN
  Administered 2021-12-30: 32.6 via INTRAVENOUS
  Filled 2021-12-30: qty 33

## 2021-12-31 ENCOUNTER — Encounter: Payer: Self-pay | Admitting: Cardiovascular Disease

## 2021-12-31 NOTE — Telephone Encounter (Signed)
Urology appt scheduled 01/06/22 at 2:00pm. ?

## 2022-01-01 ENCOUNTER — Other Ambulatory Visit: Payer: Self-pay

## 2022-01-01 ENCOUNTER — Ambulatory Visit (HOSPITAL_COMMUNITY): Payer: Medicare Other | Attending: Cardiology

## 2022-01-01 DIAGNOSIS — R0602 Shortness of breath: Secondary | ICD-10-CM

## 2022-01-01 LAB — ECHOCARDIOGRAM COMPLETE
Area-P 1/2: 4.74 cm2
MV M vel: 5.37 m/s
MV Peak grad: 115.3 mmHg
Radius: 0.5 cm
S' Lateral: 2.7 cm

## 2022-01-02 ENCOUNTER — Encounter: Payer: Self-pay | Admitting: Internal Medicine

## 2022-01-02 ENCOUNTER — Ambulatory Visit (AMBULATORY_SURGERY_CENTER): Payer: Medicare Other | Admitting: Internal Medicine

## 2022-01-02 VITALS — BP 138/88 | HR 56 | Temp 97.1°F | Resp 15 | Ht <= 58 in | Wt 122.0 lb

## 2022-01-02 DIAGNOSIS — R1013 Epigastric pain: Secondary | ICD-10-CM | POA: Diagnosis not present

## 2022-01-02 DIAGNOSIS — K449 Diaphragmatic hernia without obstruction or gangrene: Secondary | ICD-10-CM

## 2022-01-02 DIAGNOSIS — K219 Gastro-esophageal reflux disease without esophagitis: Secondary | ICD-10-CM | POA: Diagnosis not present

## 2022-01-02 MED ORDER — OMEPRAZOLE 40 MG PO CPDR: 40.0000 mg | DELAYED_RELEASE_CAPSULE | Freq: Two times a day (BID) | ORAL | 11 refills | Status: DC

## 2022-01-02 MED ORDER — SODIUM CHLORIDE 0.9 % IV SOLN: 500.0000 mL | INTRAVENOUS | Status: DC

## 2022-01-02 NOTE — Progress Notes (Signed)
HISTORY OF PRESENT ILLNESS: ?  ?Janet Bailey is a 76 y.o. female who I saw in February 2020 as a new patient for chronic abdominal pain with intermittent loose stools felt to be irritable bowel syndrome.  See that dictation for details.  She is status postcholecystectomy and hysterectomy.  She also has a history of GERD.  She was evaluated by the GI physician assistant August 06, 2021 regarding epigastric pain.  At that time she was noted to have a right inguinal hernia which was symptomatic.  She was sent to general surgery regarding her inguinal hernia and subsequently underwent repair October 22, 2021 with Dr. Johney Maine.  At the time of her office visit her omeprazole was increased to 40 mg twice daily.  Regarding her epigastric pain, CT scan January 2020 revealed small hiatal hernia.  She was sent for an esophagram to evaluate dysphagia, epigastric pain, and reflux.  She was found to have a moderate sized paraesophageal hernia which only opacified with the patient supine.  In addition a small hiatal hernia with moderate reflux noted.  Subsequent upper GI series revealed the moderate sliding hiatal hernia.  A surgical opinion was requested regarding the paraesophageal hernia with possible obstructive symptoms.  Reviewing the surgical consultation note, there was the recognition of hiatal hernia and reflux.  However, not clear that the study with the paraesophageal hernia was noted.  Patient presents today for follow-up.  She is recovering from her inguinal hernia surgery slowly but surely.  Increased dose of PPI as addressed classic reflux symptoms.  She tells me that she eats carefully.  No vomiting.  She is anticipating cataract surgery next month.  No prior colonoscopy or upper endoscopy here.  She did undergo both examinations in [place] in May 2016.  Upper endoscopy was unremarkable colonoscopy was normal. ?  ?REVIEW OF SYSTEMS: ?  ?All non-GI ROS negative unless otherwise stated in the HPI except  for cataracts ?  ?    ?Past Medical History:  ?Diagnosis Date  ? Colon polyps    ? Depression    ? GERD (gastroesophageal reflux disease)    ? Hiatal hernia    ? Hypertension    ?  ?  ?     ?Past Surgical History:  ?Procedure Laterality Date  ? ABDOMINAL HYSTERECTOMY      ? CHOLECYSTECTOMY      ? INGUINAL HERNIA REPAIR      ? tension free transvaginal tape procedure      ?  ?  ?Social History ?[name]  reports that she has never smoked. She has never used smokeless tobacco. She reports that she does not drink alcohol and does not use drugs. ?  ?family history includes Aneurysm in her mother; Colon cancer in her paternal uncle; Diabetes in her daughter; Heart disease in her father. ?  ?No Known Allergies ?  ?  ?  ?PHYSICAL EXAMINATION: ?Vital signs: BP 110/70   Pulse 69   Ht 4\' 9"  (1.448 m)   Wt 122 lb (55.3 kg)   SpO2 96%   BMI 26.40 kg/m?   ?Constitutional: generally well-appearing, no acute distress ?Psychiatric: alert and oriented x3, cooperative ?Eyes: extraocular movements intact, anicteric, conjunctiva pink ?Mouth: oral pharynx moist, no lesions ?Neck: supple no lymphadenopathy ?Cardiovascular: heart regular rate and rhythm, no murmur ?Lungs: clear to auscultation bilaterally ?Abdomen: soft, nontender, nondistended, no obvious ascites, no peritoneal signs, normal bowel sounds, no organomegaly ?Rectal: Omitted ?Extremities: no clubbing, cyanosis, or lower extremity edema bilaterally ?  Skin: no lesions on visible extremities ?Neuro: No focal deficits.  Cranial nerves intact ?  ?ASSESSMENT: ?  ?1.  GERD ?2.  Paraesophageal hernia and hiatal hernia.  Suggestion of obstructive component on barium esophagram.  Unremarkable EGD 2016, save small hiatal hernia. ?3.  Normal colonoscopy 2016 ?4.  Status post inguinal hernia repair ?  ?PLAN: ?  ?1.  Reflux precautions ?2.  Continue PPI ?3.  Upper endoscopy to further evaluate hiatal hernia with paraesophageal component.The nature of the procedure, as well  as the risks, benefits, and alternatives were carefully and thoroughly reviewed with the patient. Ample time for discussion and questions allowed. The patient understood, was satisfied, and agreed to proceed. ?4.  Recommendations thereafter to be determined.  I discussed with her issues related to paraesophageal hernia versus simple sliding hiatal hernia. ?  ?  ?The patient was seen in the office by myself November 12, 2021 regarding GERD, gastric pain, and paraesophageal hernia.  She is now for upper endoscopy interval changes. ?

## 2022-01-02 NOTE — Progress Notes (Signed)
Pt daughter states pt is "worse than she tells the doctor" and is wanting to see the dr sooner than 3 months, let her know Dr Henrene Pastor said 2 months was ok but she wanted to call the office next week and discuss with him.  Refused to sched appt in 2 months. ?

## 2022-01-02 NOTE — Patient Instructions (Addendum)
Handouts given for GERD and Hiatal Hernia. ? ?Begin 40mg  of Omeprazole twice daily, 30 minutes before eating. ? ? ?YOU HAD AN ENDOSCOPIC PROCEDURE TODAY AT [facility]:   Refer to the procedure report that was given to you for any specific questions about what was found during the examination.  If the procedure report does not answer your questions, please call your gastroenterologist to clarify.  If you requested that your care partner not be given the details of your procedure findings, then the procedure report has been included in a sealed envelope for you to review at your convenience later. ? ?YOU SHOULD EXPECT: Some feelings of bloating in the abdomen. Passage of more gas than usual.  Walking can help get rid of the air that was put into your GI tract during the procedure and reduce the bloating. If you had a lower endoscopy (such as a colonoscopy or flexible sigmoidoscopy) you may notice spotting of blood in your stool or on the toilet paper. If you underwent a bowel prep for your procedure, you may not have a normal bowel movement for a few days. ? ?Please Note:  You might notice some irritation and congestion in your nose or some drainage.  This is from the oxygen used during your procedure.  There is no need for concern and it should clear up in a day or so. ? ?SYMPTOMS TO REPORT IMMEDIATELY: ? ?Following upper endoscopy (EGD) ? Vomiting of blood or coffee ground material ? New chest pain or pain under the shoulder blades ? Painful or persistently difficult swallowing ? New shortness of breath ? Fever of 100?F or higher ? Black, tarry-looking stools ? ?For urgent or emergent issues, a gastroenterologist can be reached at any hour by calling [phone]. ?Do not use MyChart messaging for urgent concerns.  ? ? ?DIET:  We do recommend a small meal at first, but then you may proceed to your regular diet.  Drink plenty of fluids but you should avoid alcoholic beverages for 24  hours. ? ?ACTIVITY:  You should plan to take it easy for the rest of today and you should NOT DRIVE or use heavy machinery until tomorrow (because of the sedation medicines used during the test).   ? ?FOLLOW UP: ?Our staff will call the number listed on your records 48-72 hours following your procedure to check on you and address any questions or concerns that you may have regarding the information given to you following your procedure. If we do not reach you, we will leave a message.  We will attempt to reach you two times.  During this call, we will ask if you have developed any symptoms of COVID 19. If you develop any symptoms (ie: fever, flu-like symptoms, shortness of breath, cough etc.) before then, please call [phone].  If you test positive for Covid 19 in the 2 weeks post procedure, please call and report this information to [place].   ? ?If any biopsies were taken you will be contacted by phone or by letter within the next 1-3 weeks.  Please call us at [phone] if you have not heard about the biopsies in 3 weeks.  ? ? ?SIGNATURES/CONFIDENTIALITY: ?You and/or your care partner have signed paperwork which will be entered into your electronic medical record.  These signatures attest to the fact that that the information above on your After Visit Summary has been reviewed and is understood.  Full responsibility of the confidentiality of this discharge information lies  with you and/or your care-partner.  ?

## 2022-01-02 NOTE — Progress Notes (Signed)
A/ox3, pleased with MAC, report to RN 

## 2022-01-02 NOTE — Op Note (Signed)
[place] ?Patient Name: Janet Bailey ?Procedure Date: 01/02/2022 3:01 PM ?MRN: [id number] ?Endoscopist: Docia Chuck. Henrene Pastor , MD ?Age: 76 ?Referring MD:  ?Date of Birth: Jul 21, 1946 ?Gender: Female ?Account #: [id number] ?Procedure:                Upper GI endoscopy ?Indications:              Epigastric abdominal pain, Esophageal reflux,  ?                          Abnormal UGI series ?Medicines:                Monitored Anesthesia Care ?Procedure:                Pre-Anesthesia Assessment: ?                          - Prior to the procedure, a History and Physical  ?                          was performed, and patient medications and  ?                          allergies were reviewed. The patient's tolerance of  ?                          previous anesthesia was also reviewed. The risks  ?                          and benefits of the procedure and the sedation  ?                          options and risks were discussed with the patient.  ?                          All questions were answered, and informed consent  ?                          was obtained. Prior Anticoagulants: The patient has  ?                          taken no previous anticoagulant or antiplatelet  ?                          agents. ASA Grade Assessment: II - A patient with  ?                          mild systemic disease. After reviewing the risks  ?                          and benefits, the patient was deemed in  ?                          satisfactory condition to undergo the procedure. ?  After obtaining informed consent, the endoscope was  ?                          passed under direct vision. Throughout the  ?                          procedure, the patient's blood pressure, pulse, and  ?                          oxygen saturations were monitored continuously. The  ?                          GIF [id number] #[id number] was introduced through the  ?                          mouth, and advanced to the second part of  duodenum.  ?                          The upper GI endoscopy was accomplished without  ?                          difficulty. The patient tolerated the procedure  ?                          well. ?Scope In: ?Scope Out: ?Findings:                 The esophagus was normal with the gastroesophageal  ?                          junction at 30 cm from the incisors. ?                          The stomach revealed a moderate hiatal hernia with  ?                          Cameron erosions. There was a paraesophageal  ?                          component. Stomach was otherwise normal. ?                          The examined duodenum was normal. ?                          The cardia and gastric fundus were normal on  ?                          retroflexion. ?Complications:            No immediate complications. ?Estimated Blood Loss:     Estimated blood loss: none. ?Impression:               1. GERD ?                          2. Hiatal hernia with  paraesophageal component ?                          3. Cameron erosions ?                          4. Otherwise unremarkable EGD. ?Recommendation:           - Patient has a contact number available for  ?                          emergencies. The signs and symptoms of potential  ?                          delayed complications were discussed with the  ?                          patient. Return to normal activities tomorrow.  ?                          Written discharge instructions were provided to the  ?                          patient. ?                          - Resume previous diet. ?                          - Continue present medications, including  ?                          omeprazole twice daily. ?                          -Office follow-up with Dr. Henrene Pastor in 3 months ?Docia Chuck. Henrene Pastor, MD ?01/02/2022 3:30:34 PM ?This report has been signed electronically. ?

## 2022-01-02 NOTE — Progress Notes (Signed)
Pt's states no medical or surgical changes since previsit or office visit. 

## 2022-01-04 DIAGNOSIS — H2511 Age-related nuclear cataract, right eye: Secondary | ICD-10-CM | POA: Diagnosis not present

## 2022-01-06 ENCOUNTER — Telehealth: Payer: Self-pay

## 2022-01-06 DIAGNOSIS — M6281 Muscle weakness (generalized): Secondary | ICD-10-CM | POA: Diagnosis not present

## 2022-01-06 DIAGNOSIS — M6289 Other specified disorders of muscle: Secondary | ICD-10-CM | POA: Diagnosis not present

## 2022-01-06 DIAGNOSIS — M62838 Other muscle spasm: Secondary | ICD-10-CM | POA: Diagnosis not present

## 2022-01-06 NOTE — Telephone Encounter (Signed)
?  Follow up Call- ? ?Call back number 01/02/2022  ?Post procedure Call Back phone  # [phone]  ?Permission to leave phone message Yes  ?Some recent data might be hidden  ?  ? ?Patient questions: ? ?Do you have a fever, pain , or abdominal swelling? No. ?Pain Score  0 * ? ?Have you tolerated food without any problems? Yes.   ? ?Have you been able to return to your normal activities? Yes.   ? ?Do you have any questions about your discharge instructions: ?Diet   No. ?Medications  No. ?Follow up visit  No. ? ?Do you have questions or concerns about your Care? No. ? ?Actions: ?* If pain score is 4 or above: ?No action needed, pain <4. ? ? ?

## 2022-01-08 ENCOUNTER — Encounter: Payer: Self-pay | Admitting: Cardiovascular Disease

## 2022-01-08 DIAGNOSIS — H2511 Age-related nuclear cataract, right eye: Secondary | ICD-10-CM | POA: Diagnosis not present

## 2022-01-09 MED ORDER — METOPROLOL SUCCINATE ER 50 MG PO TB24: 50.0000 mg | ORAL_TABLET | Freq: Every day | ORAL | 1 refills | Status: DC

## 2022-01-09 MED ORDER — LISINOPRIL 40 MG PO TABS: 40.0000 mg | ORAL_TABLET | Freq: Every day | ORAL | 3 refills | Status: DC

## 2022-01-16 DIAGNOSIS — M62838 Other muscle spasm: Secondary | ICD-10-CM | POA: Diagnosis not present

## 2022-01-16 DIAGNOSIS — M6289 Other specified disorders of muscle: Secondary | ICD-10-CM | POA: Diagnosis not present

## 2022-01-16 DIAGNOSIS — M6281 Muscle weakness (generalized): Secondary | ICD-10-CM | POA: Diagnosis not present

## 2022-01-19 DIAGNOSIS — Z0000 Encounter for general adult medical examination without abnormal findings: Secondary | ICD-10-CM | POA: Diagnosis not present

## 2022-01-19 DIAGNOSIS — Z713 Dietary counseling and surveillance: Secondary | ICD-10-CM | POA: Diagnosis not present

## 2022-01-19 DIAGNOSIS — Z6822 Body mass index (BMI) 22.0-22.9, adult: Secondary | ICD-10-CM | POA: Diagnosis not present

## 2022-01-19 DIAGNOSIS — I10 Essential (primary) hypertension: Secondary | ICD-10-CM | POA: Diagnosis not present

## 2022-01-19 DIAGNOSIS — Z299 Encounter for prophylactic measures, unspecified: Secondary | ICD-10-CM | POA: Diagnosis not present

## 2022-01-19 DIAGNOSIS — Z789 Other specified health status: Secondary | ICD-10-CM | POA: Diagnosis not present

## 2022-01-19 DIAGNOSIS — Z7189 Other specified counseling: Secondary | ICD-10-CM | POA: Diagnosis not present

## 2022-01-21 NOTE — Telephone Encounter (Signed)
Geralynn Rile, MD  You; Caprice Beaver, LPN 16 minutes ago (7:54 PM)  ? ?Would add amlodipine 5 mg daily. Can take in the morning.  ? ?[place] T. Audie Box, MD, [facility]  ?[place]  ?[address], [address]  ?[place], [place] [address]  ?[phone]  ?4:15 PM  ? ?

## 2022-01-22 MED ORDER — AMLODIPINE BESYLATE 5 MG PO TABS: 5.0000 mg | ORAL_TABLET | Freq: Every day | ORAL | 3 refills | Status: DC

## 2022-01-22 NOTE — Addendum Note (Signed)
Addended by: Rexanne Mano B on: 01/22/2022 01:14 PM ? ? Modules accepted: Orders ? ?

## 2022-01-23 DIAGNOSIS — M6281 Muscle weakness (generalized): Secondary | ICD-10-CM | POA: Diagnosis not present

## 2022-01-23 DIAGNOSIS — M6289 Other specified disorders of muscle: Secondary | ICD-10-CM | POA: Diagnosis not present

## 2022-01-23 DIAGNOSIS — R222 Localized swelling, mass and lump, trunk: Secondary | ICD-10-CM | POA: Diagnosis not present

## 2022-01-23 DIAGNOSIS — M62838 Other muscle spasm: Secondary | ICD-10-CM | POA: Diagnosis not present

## 2022-01-23 DIAGNOSIS — Z8719 Personal history of other diseases of the digestive system: Secondary | ICD-10-CM | POA: Diagnosis not present

## 2022-01-28 ENCOUNTER — Encounter: Payer: Medicare Other | Admitting: Internal Medicine

## 2022-02-02 ENCOUNTER — Other Ambulatory Visit: Payer: Self-pay | Admitting: Internal Medicine

## 2022-02-02 DIAGNOSIS — Z1231 Encounter for screening mammogram for malignant neoplasm of breast: Secondary | ICD-10-CM

## 2022-02-04 DIAGNOSIS — M6289 Other specified disorders of muscle: Secondary | ICD-10-CM | POA: Diagnosis not present

## 2022-02-04 DIAGNOSIS — M62838 Other muscle spasm: Secondary | ICD-10-CM | POA: Diagnosis not present

## 2022-02-04 DIAGNOSIS — M6281 Muscle weakness (generalized): Secondary | ICD-10-CM | POA: Diagnosis not present

## 2022-02-20 MED ORDER — SPIRONOLACTONE 25 MG PO TABS: 12.5000 mg | ORAL_TABLET | Freq: Every day | ORAL | 3 refills | Status: DC

## 2022-02-20 NOTE — Addendum Note (Signed)
Addended by: Caprice Beaver T on: 02/20/2022 11:46 AM ? ? Modules accepted: Orders ? ?

## 2022-02-25 ENCOUNTER — Encounter: Payer: Self-pay | Admitting: Internal Medicine

## 2022-02-25 ENCOUNTER — Ambulatory Visit: Payer: Medicare Other | Admitting: Internal Medicine

## 2022-02-25 VITALS — BP 146/68 | HR 60 | Ht <= 58 in | Wt 113.5 lb

## 2022-02-25 DIAGNOSIS — K219 Gastro-esophageal reflux disease without esophagitis: Secondary | ICD-10-CM

## 2022-02-25 DIAGNOSIS — R935 Abnormal findings on diagnostic imaging of other abdominal regions, including retroperitoneum: Secondary | ICD-10-CM

## 2022-02-25 DIAGNOSIS — M6289 Other specified disorders of muscle: Secondary | ICD-10-CM | POA: Diagnosis not present

## 2022-02-25 DIAGNOSIS — K449 Diaphragmatic hernia without obstruction or gangrene: Secondary | ICD-10-CM

## 2022-02-25 DIAGNOSIS — R1013 Epigastric pain: Secondary | ICD-10-CM

## 2022-02-25 DIAGNOSIS — M62838 Other muscle spasm: Secondary | ICD-10-CM | POA: Diagnosis not present

## 2022-02-25 DIAGNOSIS — M6281 Muscle weakness (generalized): Secondary | ICD-10-CM | POA: Diagnosis not present

## 2022-02-25 NOTE — Patient Instructions (Signed)
I will refer you to Johnathan Hausen at [facility]. ? ? ?

## 2022-02-25 NOTE — Progress Notes (Signed)
HISTORY OF PRESENT ILLNESS: ? ?Janet Bailey is a 76 y.o. female with past medical history as listed below.  She presents today regarding GERD and symptomatic paraesophageal hernia.  I saw the patient November 12, 2021 in this office regarding GERD, paraesophageal hernia/hiatal hernia with obstructive component on barium esophagram.  She subsequently underwent upper endoscopy and was found to have a paraesophageal hernia.  She has been on twice daily PPI.  She follows up at this time as requested.  She is accompanied by her daughter.  They have a myriad of questions (the daughter does).  As best I can tell the patient has altered her eating style.  If she eats larger portions she is uncomfortable.  She does describe more discrete episodes of what sounds like transient obstruction.  She has lost 5 or 10 pounds over the past year due to change in eating habits.  She did lose her husband.  She will get some burning despite twice daily PPI.  She does take an acids on demand.  Her daughter states that she complains of burning, belching, and stomach pain quite regularly.  Patient is a suboptimal historian at best. ? ?REVIEW OF SYSTEMS: ? ?All non-GI ROS negative unless otherwise stated in the HPI except for anxiety, depression, fatigue, cough, headaches, hearing problems, shortness of breath ? ?Past Medical History:  ?Diagnosis Date  ? Colon polyps   ? Depression   ? GERD (gastroesophageal reflux disease)   ? Hiatal hernia   ? Hypertension   ? ? ?Past Surgical History:  ?Procedure Laterality Date  ? ABDOMINAL HYSTERECTOMY    ? cataract surgery    ? CHOLECYSTECTOMY    ? INGUINAL HERNIA REPAIR    ? tension free transvaginal tape procedure    ? ? ?Social History ?Janet Bailey  reports that she has never smoked. She has never used smokeless tobacco. She reports that she does not drink alcohol and does not use drugs. ? ?family history includes Aneurysm in her mother; Arrhythmia in her sister; Colon cancer in her  paternal uncle; Diabetes in her daughter; Heart disease in her father and sister. ? ?No Known Allergies ? ?  ? ?PHYSICAL EXAMINATION: ?Vital signs: BP (!) 146/68   Pulse 60   Ht 4' 9"  (1.448 m)   Wt 113 lb 8 oz (51.5 kg)   SpO2 95%   BMI 24.56 kg/m?   ?Constitutional: generally well-appearing, no acute distress ?Psychiatric: alert and oriented x3, cooperative ?Eyes: extraocular movements intact, anicteric, conjunctiva pink ?Mouth: oral pharynx moist, no lesions ?Neck: supple no lymphadenopathy ?Back: Scoliosis ?Cardiovascular: heart regular rate and rhythm, no murmur ?Lungs: clear to auscultation bilaterally ?Abdomen: soft, nontender, nondistended, no obvious ascites, no peritoneal signs, normal bowel sounds, no organomegaly ?Rectal: Omitted ?Extremities: no clubbing, cyanosis, or lower extremity edema bilaterally ?Skin: no lesions on visible extremities ?Neuro: No focal deficits.  Cranial nerves intact ? ?ASSESSMENT: ? ?1.  GERD.  Symptoms despite twice daily PPI ?2.  Hiatal hernia with paraesophageal component.  CT scan, barium esophagram, upper GI series, and EGD assessments. ?3.  Chronic pain and intermittent obstructive symptoms felt secondary to complex hernia ?4.  Irritable bowel syndrome better on fiber ?5.  Normal colonoscopy Janet Bailey 2016 ? ? ?PLAN: ? ?1.  Reflux precautions ?2.  Continue twice daily PPI ?3.  Antacids on demand ?4.  Surgical opinion with Dr. Johnathan Hausen regarding the prospects of repairing her paraesophageal hernia in hopes of improving her trouble some reflux, epigastric pain, and occasional  obstructive symptoms. ?A total time of 40 minutes was spent preparing to see the patient, obtaining comprehensive history, performing medically appropriate physical examination, counseling and educating the patient and her daughter (the majority of the time) regarding her above listed issues, prescribing GI medications, arranging surgical consultation, and documenting clinical information  in the health record ? ? ? ? ?  ?

## 2022-03-03 ENCOUNTER — Ambulatory Visit: Payer: Medicare Other | Admitting: Internal Medicine

## 2022-03-07 ENCOUNTER — Emergency Department (HOSPITAL_BASED_OUTPATIENT_CLINIC_OR_DEPARTMENT_OTHER)
Admission: EM | Admit: 2022-03-07 | Discharge: 2022-03-07 | Disposition: A | Discharge status: Home / Self Care | Payer: Medicare Other | Attending: Emergency Medicine | Admitting: Emergency Medicine

## 2022-03-07 ENCOUNTER — Emergency Department (HOSPITAL_BASED_OUTPATIENT_CLINIC_OR_DEPARTMENT_OTHER): Payer: Medicare Other | Admitting: Radiology

## 2022-03-07 ENCOUNTER — Telehealth: Payer: Self-pay | Admitting: Physician Assistant

## 2022-03-07 DIAGNOSIS — I10 Essential (primary) hypertension: Secondary | ICD-10-CM | POA: Diagnosis not present

## 2022-03-07 DIAGNOSIS — R531 Weakness: Secondary | ICD-10-CM

## 2022-03-07 DIAGNOSIS — R002 Palpitations: Secondary | ICD-10-CM | POA: Diagnosis not present

## 2022-03-07 DIAGNOSIS — R0789 Other chest pain: Secondary | ICD-10-CM | POA: Diagnosis not present

## 2022-03-07 DIAGNOSIS — R059 Cough, unspecified: Secondary | ICD-10-CM | POA: Diagnosis not present

## 2022-03-07 LAB — CBC WITH DIFFERENTIAL/PLATELET
Abs Immature Granulocytes: 0.01 10*3/uL (ref 0.00–0.07)
Basophils Absolute: 0.1 10*3/uL (ref 0.0–0.1)
Basophils Relative: 1 %
Eosinophils Absolute: 0.2 10*3/uL (ref 0.0–0.5)
Eosinophils Relative: 4 %
HCT: 39 % (ref 36.0–46.0)
Hemoglobin: 12.9 g/dL (ref 12.0–15.0)
Immature Granulocytes: 0 %
Lymphocytes Relative: 35 %
Lymphs Abs: 1.6 10*3/uL (ref 0.7–4.0)
MCH: 30.2 pg (ref 26.0–34.0)
MCHC: 33.1 g/dL (ref 30.0–36.0)
MCV: 91.3 fL (ref 80.0–100.0)
Monocytes Absolute: 0.4 10*3/uL (ref 0.1–1.0)
Monocytes Relative: 9 %
Neutro Abs: 2.4 10*3/uL (ref 1.7–7.7)
Neutrophils Relative %: 51 %
Platelets: 226 10*3/uL (ref 150–400)
RBC: 4.27 MIL/uL (ref 3.87–5.11)
RDW: 13.4 % (ref 11.5–15.5)
WBC: 4.6 10*3/uL (ref 4.0–10.5)
nRBC: 0 % (ref 0.0–0.2)

## 2022-03-07 LAB — BASIC METABOLIC PANEL
Anion gap: 8 (ref 5–15)
BUN: 14 mg/dL (ref 8–23)
CO2: 27 mmol/L (ref 22–32)
Calcium: 9.2 mg/dL (ref 8.9–10.3)
Chloride: 103 mmol/L (ref 98–111)
Creatinine, Ser: 0.58 mg/dL (ref 0.44–1.00)
GFR, Estimated: >60 mL/min (ref >60)
Glucose, Bld: 106 mg/dL — ABNORMAL HIGH (ref 70–99)
Potassium: 4 mmol/L (ref 3.5–5.1)
Sodium: 138 mmol/L (ref 135–145)

## 2022-03-07 LAB — MAGNESIUM: Magnesium: 2 mg/dL (ref 1.7–2.4)

## 2022-03-07 LAB — TROPONIN I (HIGH SENSITIVITY): Troponin I (High Sensitivity): 4 ng/L (ref <18)

## 2022-03-07 NOTE — ED Triage Notes (Signed)
Pt from home c/o generalized weakness and palpitations x 1 week, with some SOB. Denies chest pain. Per daughter, pt sees a cardiologist d/t palpitations/ bigeminy on Lopressor. Has a cardiology appt on Wednesday.  ?

## 2022-03-07 NOTE — ED Notes (Signed)
Discharge instructions, follow up care, and medications reviewed and explained, pt verbalized understanding.  ?

## 2022-03-07 NOTE — ED Provider Notes (Signed)
? ?Emergency Department Provider Note ? ? ?I have reviewed the triage vital signs and the nursing notes. ? ? ?HISTORY ? ?Chief Complaint ?Palpitations ? ? ?HPI ?Janet Bailey is a 76 y.o. female presents to the ED with heart palpitations intermittently over the last week with some generalized weakness. No CP/pressure/tightness. No cough. Some occasional burning lower chest pain which feels similar to pain from know paraesophageal hernia. She is seeking general surgery evaluation for this. She follows with Cardiology and has had bigeminy managed with increasing doses of Metoprolol. Patient has not noticed association between increasing Metoprolol and weakness. Her Apple watch was picking up some abnormal rhythm this AM with family concern for possible heart block. She called her Cardiology team and was advised to present to the ED for evaluation.   ? ? ?Past Medical History:  ?Diagnosis Date  ? Colon polyps   ? Depression   ? GERD (gastroesophageal reflux disease)   ? Hiatal hernia   ? Hypertension   ? ? ?Review of Systems ? ?Constitutional: No fever/chills. Positive generalized weakness.  ?Cardiovascular: Denies chest pain. Positive palpitations.  ?Respiratory: Denies shortness of breath. ?Gastrointestinal: No abdominal pain.  ?Skin: Negative for rash. ?Neurological: Negative for headaches. ? ? ?____________________________________________ ? ? ?PHYSICAL EXAM: ? ?VITAL SIGNS: ?ED Triage Vitals  ?Enc Vitals Group  ?   BP 03/07/22 0851 (!) 158/61  ?   Pulse Rate 03/07/22 0851 63  ?   Resp 03/07/22 0851 15  ?   Temp 03/07/22 0851 98.1 ?F (36.7 ?C)  ?   Temp Source 03/07/22 0851 Oral  ?   SpO2 03/07/22 0851 97 %  ?   Weight 03/07/22 0849 113 lb (51.3 kg)  ?   Height 03/07/22 0849 '4\' 9"'$  (1.448 m)  ? ?Constitutional: Alert and oriented. Well appearing and in no acute distress. ?Eyes: Conjunctivae are normal.  ?Head: Atraumatic. ?Nose: No congestion/rhinnorhea. ?Mouth/Throat: Mucous membranes are moist.   ?Neck: No  stridor.  ?Cardiovascular: Normal rate, regular rhythm. Good peripheral circulation. Grossly normal heart sounds.   ?Respiratory: Normal respiratory effort.  No retractions. Lungs CTAB. ?Gastrointestinal: Soft and nontender. No distention.  ?Musculoskeletal: No lower extremity tenderness nor edema. No gross deformities of extremities. ?Neurologic:  Normal speech and language.  ?Skin:  Skin is warm, dry and intact. No rash noted. ? ?____________________________________________ ?  ?LABS ?(all labs ordered are listed, but only abnormal results are displayed) ? ?Labs Reviewed  ?BASIC METABOLIC PANEL - Abnormal; Notable for the following components:  ?    Result Value  ? Glucose, Bld 106 (*)   ? All other components within normal limits  ?CBC WITH DIFFERENTIAL/PLATELET  ?MAGNESIUM  ?TROPONIN I (HIGH SENSITIVITY)  ? ?____________________________________________ ? ?EKG ? ? EKG Interpretation ? ?Date/Time:  Saturday Mar 07 2022 08:49:42 EDT ?Ventricular Rate:  66 ?PR Interval:  196 ?QRS Duration: 94 ?QT Interval:  447 ?QTC Calculation: 469 ?R Axis:   0 ?Text Interpretation: Sinus rhythm Abnormal R-wave progression, early transition Confirmed by Davonna Belling [id number]) on 03/08/2022 5:13:37 PM ?  ? ?  ? ? ?____________________________________________ ? ? ?PROCEDURES ? ?Procedure(s) performed:  ? ?Procedures ? ?None ?____________________________________________ ? ? ?INITIAL IMPRESSION / ASSESSMENT AND PLAN / ED COURSE ? ?Pertinent labs & imaging results that were available during my care of the patient were reviewed by me and considered in my medical decision making (see chart for details). ?  ?This patient is Presenting for Evaluation of palpitations, which does require a range of treatment options,  and is a complaint that involves a high risk of morbidity and mortality. ? ?The Differential Diagnoses include arrhythmia, dehydration, medication side effect, developing infection. ? ? ?I did obtain Additional Historical  Information from daughter at bedside. ? ?I decided to review pertinent External Data, and in summary patient followed by Cardiology. Reviewed prior notes. . ?  ?Clinical Laboratory Tests Ordered, included Mg is normal. Normal potassium. No AKI. Troponin normal. CBC normal.  ? ?Radiologic Tests Ordered, included CXR. I independently interpreted the images and agree with radiology interpretation.  ? ?Cardiac Monitor Tracing which shows NSR. ? ? ?Social Determinants of Health Risk patient is a non-smoker.  ? ?Medical Decision Making: Summary:  ?Patient presents to the ED with palpitations and weakness. No apparent AV block on telemetry here. Patient with generalized weakness. Question if this may be due to metoprolol use. Will cut dose in half and keep appointment with Cardiology for follow up.  ? ?Reevaluation with update and discussion with patient and family. Plan for dose change and Cardiology follow up this week.  ? ?Disposition: discharge ? ?____________________________________________ ? ?FINAL CLINICAL IMPRESSION(S) / ED DIAGNOSES ? ?Final diagnoses:  ?Palpitations  ?Generalized weakness  ? ? ? ?Note:  This document was prepared using Dragon voice recognition software and may include unintentional dictation errors. ? ?Nanda Quinton, MD, FACEP ?Emergency Medicine ? ?  ?Margette Fast, MD ?03/10/22 0818 ? ?

## 2022-03-07 NOTE — ED Notes (Signed)
Patient transported to X-ray 

## 2022-03-07 NOTE — Discharge Instructions (Signed)
You are seen in the emergency room today with heart palpitations.  I do not see any evidence of a heart block here or on your phone.  We discussed that your weakness may be caused by metoprolol dosing and will have you cut back to the 25 mg dose as opposed to the 50 mg dose.  Please keep your follow-up appointment with your cardiologist on Wednesday and they can review your symptoms and decide on treatment moving forward. Return to the ED with any new or suddenly worsening symptoms.  ?

## 2022-03-07 NOTE — Telephone Encounter (Signed)
76 yo female seen recently by Dr. Audie Box for palpitations. ?Echocardiogram with normal EF. ?Myoview low risk. ?Pt was placed on Toprol 25 >> increased to 50. ?Pt recently felt low energy, weakness.  Could not get BP this AM. ?Daughter Vaughan Basta - DPR on file) put her Apple watch on her and it read 2nd degree heart block type II. ?She was taken to the ED and prior to going in, the Apple watch is showing NSR.  She called to see if she should be seen. ? ?PLAN:  ?I have recommended she check into the ED to be seen.  She will need an EKG and monitoring on tele as well as check of e-lytes.  If she is truly having 2nd degree AVB, she will need her beta-blocker reduced or DC'd.  She agrees with the plan to be seen in the ED. ? ?Richardson Dopp, PA-C    ?03/07/2022 8:42 AM   ?

## 2022-03-09 ENCOUNTER — Ambulatory Visit
Admission: RE | Admit: 2022-03-09 | Discharge: 2022-03-09 | Disposition: A | Discharge status: Home / Self Care | Payer: Medicare Other | Source: Ambulatory Visit | Attending: Internal Medicine | Admitting: Internal Medicine

## 2022-03-09 ENCOUNTER — Other Ambulatory Visit: Payer: Self-pay | Admitting: Family Medicine

## 2022-03-09 DIAGNOSIS — Z1231 Encounter for screening mammogram for malignant neoplasm of breast: Secondary | ICD-10-CM

## 2022-03-10 ENCOUNTER — Ambulatory Visit: Payer: Medicare Other | Admitting: Internal Medicine

## 2022-03-10 NOTE — Progress Notes (Signed)
?Cardiology Office Note:   ?Date:  03/11/2022  ?NAME:  Janet Bailey    ?MRN: [id number] ?DOB:  [birth year]  ? ?PCP:  Jettie Booze, NP  ?Cardiologist:  Evalina Field, MD  ?Electrophysiologist:  None  ? ?Referring MD: Glenda Chroman, MD  ? ?Chief Complaint  ?Patient presents with  ? Follow-up  ? ? ?History of Present Illness:   ?Zunaira Lamy is a 76 y.o. female with a hx of PVCs, palpitations, HTN who presents for follow-up. Seen for weakness and fatigue. Normal stress test, normal echo. Monitor with 2% PACs and 3% PVCs.  Presents for follow-up.  Seen in the emergency room on 03/07/2022.  Daughter was concerned for low pulse and possible heart block.  Review of the Apple EKG demonstrates sinus rhythm with atrial bigeminy.  There was no evidence of heart block.  Taken to the emergency room with normal work-up.  EKG today in office demonstrates sinus rhythm heart rate 63 without ischemic changes.  Echo and stress test normal.  Reviewed monitor from primary care physician which showed brief atrial and ventricular ectopy.  She still reports low energy.  She is depressed and anxious.  Has been placed on amitriptyline.  I do have concern about this medication.  It can cause tachycardia and bradycardia.  It can also cause increased drowsiness.  Blood pressure still elevated between 130-150.  Blood pressure 148/62 today.  We discussed increasing her Aldactone.  She was on amlodipine but had swelling.  She denies any chest pain.  Reports no significant shortness of breath but just low energy.  I am very concerned about her anxiety and depression as I believe this is contributing to her symptoms. ? ?Problem List ?HTN ?Depression  ?GERD ?PVCs ?-3% ?5. PACs ?-2% ? ?Past Medical History: ?Past Medical History:  ?Diagnosis Date  ? Colon polyps   ? Depression   ? GERD (gastroesophageal reflux disease)   ? Hiatal hernia   ? Hypertension   ? ? ?Past Surgical History: ?Past Surgical History:  ?Procedure Laterality Date  ?  ABDOMINAL HYSTERECTOMY    ? cataract surgery    ? CHOLECYSTECTOMY    ? INGUINAL HERNIA REPAIR    ? tension free transvaginal tape procedure    ? ? ?Current Medications: ?Current Meds  ?Medication Sig  ? amitriptyline (ELAVIL) 25 MG tablet Take 25 mg by mouth daily.  ? Calcium Carbonate-Vitamin D 600-200 MG-UNIT TABS Take by mouth daily.  ? cholecalciferol (VITAMIN D) 1000 UNITS tablet Take 1,000 Units by mouth 2 (two) times daily.  ? clonazePAM (KLONOPIN) 0.5 MG tablet Take 0.5 mg by mouth 2 (two) times daily as needed for anxiety.  ? estradiol (ESTRACE) 0.1 MG/GM vaginal cream 1 gram vaginally and a pea sized amount topically qhs x 2 weeks, then change to twice weekly  ? lisinopril (ZESTRIL) 40 MG tablet Take 1 tablet (40 mg total) by mouth daily.  ? omeprazole (PRILOSEC) 40 MG capsule Take 1 capsule (40 mg total) by mouth 2 (two) times daily before a meal.  ? [DISCONTINUED] ALPRAZolam (XANAX) 0.25 MG tablet Take 0.25 mg by mouth 3 (three) times daily as needed.  ? [DISCONTINUED] aspirin EC 81 MG tablet Take 81 mg by mouth daily. Swallow whole.  ? [DISCONTINUED] metoprolol succinate (TOPROL XL) 50 MG 24 hr tablet Take 1 tablet (50 mg total) by mouth daily. (Patient taking differently: Take 25 mg by mouth daily.)  ? [DISCONTINUED] spironolactone (ALDACTONE) 25 MG tablet Take 0.5 tablets (12.5  mg total) by mouth daily.  ?  ? ?Allergies:    ?Patient has no known allergies.  ? ?Social History: ?Social History  ? ?Socioeconomic History  ? Marital status: Widowed  ?  Spouse name: Not on file  ? Number of children: 2  ? Years of education: Not on file  ? Highest education level: Not on file  ?Occupational History  ? Occupation: retired  ?Tobacco Use  ? Smoking status: Never  ? Smokeless tobacco: Never  ?Vaping Use  ? Vaping Use: Never used  ?Substance and Sexual Activity  ? Alcohol use: No  ? Drug use: No  ? Sexual activity: Not Currently  ?  Comment: 1st intercourse 86 yo-1 partner  ?Other Topics Concern  ? Not on file   ?Social History Narrative  ? ** Merged History Encounter **  ?    ? ?Social Determinants of Health  ? ?Financial Resource Strain: Not on file  ?Food Insecurity: Not on file  ?Transportation Needs: Not on file  ?Physical Activity: Not on file  ?Stress: Not on file  ?Social Connections: Not on file  ?  ? ?Family History: ?The patient's family history includes Aneurysm in her mother; Arrhythmia in her sister; Colon cancer in her paternal uncle; Diabetes in her daughter; Heart disease in her father and sister. ? ?ROS:   ?All other ROS reviewed and negative. Pertinent positives noted in the HPI.    ? ?EKGs/Labs/Other Studies Reviewed:   ?The following studies were personally reviewed by me today: ? ?EKG:  EKG is ordered today.  The ekg ordered today demonstrates normal sinus rhythm heart rate 63, no acute ischemic changes or evidence of infarction, and was personally reviewed by me.  ? ?TTE 01/01/2022 ? ? 1. Left ventricular ejection fraction, by estimation, is 60 to 65%. The  ?left ventricle has normal function. The left ventricle has no regional  ?wall motion abnormalities. Left ventricular diastolic parameters are  ?consistent with Grade I diastolic  ?dysfunction (impaired relaxation).  ? 2. Right ventricular systolic function is normal. The right ventricular  ?size is normal. There is normal pulmonary artery systolic pressure. The  ?estimated right ventricular systolic pressure is 26.3 mmHg.  ? 3. Left atrial size was moderately dilated.  ? 4. The mitral valve is normal in structure. Mild mitral valve  ?regurgitation. No evidence of mitral stenosis.  ? 5. The aortic valve is tricuspid. There is mild calcification of the  ?aortic valve. Aortic valve regurgitation is not visualized. No aortic  ?stenosis is present.  ? 6. The inferior vena cava is normal in size with greater than 50%  ?respiratory variability, suggesting right atrial pressure of 3 mmHg.  ? ?NM Stress 12/30/2021 ?  The study is normal. The study is low  risk. ?  No ST deviation was noted. ?  Left ventricular function is normal. Nuclear stress EF: 72 %. The left ventricular ejection fraction is hyperdynamic (>65%). End diastolic cavity size is normal. ?  Prior study not available for comparison. ?  ?Low risk stress nuclear study with normal perfusion and normal left ventricular regional and global systolic function. ? ?Recent Labs: ?03/07/2022: BUN 14; Creatinine, Ser 0.58; Hemoglobin 12.9; Magnesium 2.0; Platelets 226; Potassium 4.0; Sodium 138  ? ?Recent Lipid Panel ?No results found for: CHOL, TRIG, HDL, CHOLHDL, VLDL, LDLCALC, LDLDIRECT ? ?Physical Exam:   ?VS:  BP (!) 148/62 (BP Location: Left Arm, Patient Position: Sitting, Cuff Size: Normal)   Pulse 63   Ht '4\' 9"'$  (1.448  m)   Wt 113 lb (51.3 kg)   BMI 24.45 kg/m?    ?Wt Readings from Last 3 Encounters:  ?03/11/22 113 lb (51.3 kg)  ?03/07/22 113 lb (51.3 kg)  ?02/25/22 113 lb 8 oz (51.5 kg)  ?  ?General: Well nourished, well developed, in no acute distress ?Head: Atraumatic, normal size  ?Eyes: PEERLA, EOMI  ?Neck: Supple, no JVD ?Endocrine: No thryomegaly ?Cardiac: Normal S1, S2; RRR; no murmurs, rubs, or gallops ?Lungs: Clear to auscultation bilaterally, no wheezing, rhonchi or rales  ?Abd: Soft, nontender, no hepatomegaly  ?Ext: No edema, pulses 2+ ?Musculoskeletal: No deformities, BUE and BLE strength normal and equal ?Skin: Warm and dry, no rashes   ?Neuro: Alert and oriented to person, place, time, and situation, CNII-XII grossly intact, no focal deficits  ?Psych: Normal mood and affect  ? ?ASSESSMENT:   ?Victoire Deans is a 76 y.o. female who presents for the following: ?1. PVC (premature ventricular contraction)   ?2. Palpitations   ?3. SOB (shortness of breath)   ? ? ?PLAN:   ?1. PVC (premature ventricular contraction) ?2. Palpitations ?3. SOB (shortness of breath) ?-Recent evaluation for low energy as well as ventricular and atrial ectopy.  Started on metoprolol with some improvement in  symptoms.  Her echo was normal.  Her stress test was normal.  Her monitor just shows 2% PACs and 3% PVCs.  She had brief atrial tachycardia episodes that were less than 5 to 6 seconds.  This is really unremarkable.  Sh

## 2022-03-11 ENCOUNTER — Ambulatory Visit: Payer: Medicare Other | Admitting: Cardiovascular Disease

## 2022-03-11 ENCOUNTER — Encounter: Payer: Self-pay | Admitting: Cardiovascular Disease

## 2022-03-11 VITALS — BP 148/62 | HR 63 | Ht <= 58 in | Wt 113.0 lb

## 2022-03-11 DIAGNOSIS — R002 Palpitations: Secondary | ICD-10-CM

## 2022-03-11 DIAGNOSIS — R0602 Shortness of breath: Secondary | ICD-10-CM

## 2022-03-11 DIAGNOSIS — I493 Ventricular premature depolarization: Secondary | ICD-10-CM

## 2022-03-11 MED ORDER — SPIRONOLACTONE 25 MG PO TABS
25.0000 mg | ORAL_TABLET | Freq: Every day | ORAL | 3 refills | Status: DC
Start: 2022-03-11 — End: 2022-07-29

## 2022-03-11 MED ORDER — METOPROLOL SUCCINATE ER 25 MG PO TB24: 25.0000 mg | ORAL_TABLET | Freq: Every day | ORAL | 1 refills | Status: DC

## 2022-03-11 NOTE — Patient Instructions (Signed)
Medication Instructions:  ?Decrease Metoprolol to 25 mg daily  ?Increase Aldactone to 25 mg daily ?STOP aspirin ? ?*If you need a refill on your cardiac medications before your next appointment, please call your pharmacy* ? ? ? ?Follow-Up: ?At [facility], you and your health needs are our priority.  As part of our continuing mission to provide you with exceptional heart care, we have created designated Provider Care Teams.  These Care Teams include your primary Cardiologist (physician) and Advanced Practice Providers (APPs -  Physician Assistants and Nurse Practitioners) who all work together to provide you with the care you need, when you need it. ? ?We recommend signing up for the patient portal called "MyChart".  Sign up information is provided on this After Visit Summary.  MyChart is used to connect with patients for Virtual Visits (Telemedicine).  Patients are able to view lab/test results, encounter notes, upcoming appointments, etc.  Non-urgent messages can be sent to your provider as well.   ?To learn more about what you can do with MyChart, go to [email or web address].   ? ?Your next appointment:   ?6 month(s) ? ?The format for your next appointment:   ?In Person ? ?Provider:   ?Evalina Field, MD   ? ? ? ? ? ? ? ? ?

## 2022-03-22 DIAGNOSIS — B029 Zoster without complications: Secondary | ICD-10-CM | POA: Diagnosis not present

## 2022-03-22 DIAGNOSIS — K120 Recurrent oral aphthae: Secondary | ICD-10-CM | POA: Diagnosis not present

## 2022-03-22 DIAGNOSIS — N390 Urinary tract infection, site not specified: Secondary | ICD-10-CM | POA: Diagnosis not present

## 2022-03-23 DIAGNOSIS — R52 Pain, unspecified: Secondary | ICD-10-CM | POA: Diagnosis not present

## 2022-03-23 DIAGNOSIS — L03221 Cellulitis of neck: Secondary | ICD-10-CM | POA: Diagnosis not present

## 2022-03-26 DIAGNOSIS — M7989 Other specified soft tissue disorders: Secondary | ICD-10-CM | POA: Diagnosis not present

## 2022-03-26 DIAGNOSIS — B029 Zoster without complications: Secondary | ICD-10-CM | POA: Diagnosis not present

## 2022-03-26 DIAGNOSIS — S93401A Sprain of unspecified ligament of right ankle, initial encounter: Secondary | ICD-10-CM | POA: Diagnosis not present

## 2022-03-26 DIAGNOSIS — M25571 Pain in right ankle and joints of right foot: Secondary | ICD-10-CM | POA: Diagnosis not present

## 2022-04-06 DIAGNOSIS — R21 Rash and other nonspecific skin eruption: Secondary | ICD-10-CM | POA: Diagnosis not present

## 2022-04-06 DIAGNOSIS — B028 Zoster with other complications: Secondary | ICD-10-CM | POA: Diagnosis not present

## 2022-04-06 DIAGNOSIS — H6192 Disorder of left external ear, unspecified: Secondary | ICD-10-CM | POA: Diagnosis not present

## 2022-04-15 DIAGNOSIS — K219 Gastro-esophageal reflux disease without esophagitis: Secondary | ICD-10-CM | POA: Diagnosis not present

## 2022-04-15 DIAGNOSIS — K449 Diaphragmatic hernia without obstruction or gangrene: Secondary | ICD-10-CM | POA: Diagnosis not present

## 2022-04-28 DIAGNOSIS — Z0000 Encounter for general adult medical examination without abnormal findings: Secondary | ICD-10-CM | POA: Diagnosis not present

## 2022-04-28 DIAGNOSIS — R300 Dysuria: Secondary | ICD-10-CM | POA: Diagnosis not present

## 2022-04-28 DIAGNOSIS — I10 Essential (primary) hypertension: Secondary | ICD-10-CM | POA: Diagnosis not present

## 2022-04-28 DIAGNOSIS — L309 Dermatitis, unspecified: Secondary | ICD-10-CM | POA: Diagnosis not present

## 2022-04-28 DIAGNOSIS — R638 Other symptoms and signs concerning food and fluid intake: Secondary | ICD-10-CM | POA: Diagnosis not present

## 2022-04-28 DIAGNOSIS — Z79899 Other long term (current) drug therapy: Secondary | ICD-10-CM | POA: Diagnosis not present

## 2022-04-28 DIAGNOSIS — Z13220 Encounter for screening for lipoid disorders: Secondary | ICD-10-CM | POA: Diagnosis not present

## 2022-04-28 DIAGNOSIS — B0229 Other postherpetic nervous system involvement: Secondary | ICD-10-CM | POA: Diagnosis not present

## 2022-05-04 DIAGNOSIS — Z79899 Other long term (current) drug therapy: Secondary | ICD-10-CM | POA: Diagnosis not present

## 2022-05-19 ENCOUNTER — Other Ambulatory Visit: Payer: Self-pay | Admitting: Cardiovascular Disease

## 2022-05-25 ENCOUNTER — Encounter: Payer: Self-pay | Admitting: Internal Medicine

## 2022-07-08 DIAGNOSIS — G5603 Carpal tunnel syndrome, bilateral upper limbs: Secondary | ICD-10-CM | POA: Diagnosis not present

## 2022-07-20 ENCOUNTER — Encounter: Payer: Self-pay | Admitting: Cardiovascular Disease

## 2022-07-20 DIAGNOSIS — R300 Dysuria: Secondary | ICD-10-CM | POA: Diagnosis not present

## 2022-07-20 DIAGNOSIS — G5621 Lesion of ulnar nerve, right upper limb: Secondary | ICD-10-CM | POA: Diagnosis not present

## 2022-07-20 DIAGNOSIS — G5622 Lesion of ulnar nerve, left upper limb: Secondary | ICD-10-CM | POA: Diagnosis not present

## 2022-07-20 DIAGNOSIS — G5603 Carpal tunnel syndrome, bilateral upper limbs: Secondary | ICD-10-CM | POA: Diagnosis not present

## 2022-07-21 MED ORDER — LOSARTAN POTASSIUM 50 MG PO TABS
50.0000 mg | ORAL_TABLET | Freq: Every day | ORAL | 3 refills | Status: DC
Start: 2022-07-21 — End: 2022-07-29

## 2022-07-27 ENCOUNTER — Inpatient Hospital Stay (HOSPITAL_COMMUNITY): Payer: Medicare Other

## 2022-07-27 ENCOUNTER — Inpatient Hospital Stay (HOSPITAL_COMMUNITY)
Admission: EM | Admit: 2022-07-27 | Discharge: 2022-07-29 | DRG: 641 | Disposition: A | Discharge status: Home / Self Care | Payer: Medicare Other | Attending: Internal Medicine | Admitting: Internal Medicine

## 2022-07-27 ENCOUNTER — Other Ambulatory Visit: Payer: Self-pay

## 2022-07-27 ENCOUNTER — Emergency Department (HOSPITAL_COMMUNITY): Payer: Medicare Other

## 2022-07-27 ENCOUNTER — Encounter (HOSPITAL_COMMUNITY): Payer: Self-pay | Admitting: Family Medicine

## 2022-07-27 DIAGNOSIS — E860 Dehydration: Secondary | ICD-10-CM | POA: Diagnosis not present

## 2022-07-27 DIAGNOSIS — E871 Hypo-osmolality and hyponatremia: Principal | ICD-10-CM | POA: Diagnosis present

## 2022-07-27 DIAGNOSIS — I10 Essential (primary) hypertension: Secondary | ICD-10-CM | POA: Diagnosis not present

## 2022-07-27 DIAGNOSIS — F411 Generalized anxiety disorder: Secondary | ICD-10-CM

## 2022-07-27 DIAGNOSIS — Z90710 Acquired absence of both cervix and uterus: Secondary | ICD-10-CM

## 2022-07-27 DIAGNOSIS — I639 Cerebral infarction, unspecified: Secondary | ICD-10-CM | POA: Diagnosis not present

## 2022-07-27 DIAGNOSIS — K219 Gastro-esophageal reflux disease without esophagitis: Secondary | ICD-10-CM | POA: Diagnosis not present

## 2022-07-27 DIAGNOSIS — E878 Other disorders of electrolyte and fluid balance, not elsewhere classified: Secondary | ICD-10-CM | POA: Diagnosis present

## 2022-07-27 DIAGNOSIS — D649 Anemia, unspecified: Secondary | ICD-10-CM | POA: Diagnosis present

## 2022-07-27 DIAGNOSIS — Z800 Family history of malignant neoplasm of digestive organs: Secondary | ICD-10-CM | POA: Diagnosis not present

## 2022-07-27 DIAGNOSIS — Z043 Encounter for examination and observation following other accident: Secondary | ICD-10-CM | POA: Diagnosis not present

## 2022-07-27 DIAGNOSIS — F32A Depression, unspecified: Secondary | ICD-10-CM | POA: Diagnosis present

## 2022-07-27 DIAGNOSIS — S22089A Unspecified fracture of T11-T12 vertebra, initial encounter for closed fracture: Secondary | ICD-10-CM | POA: Diagnosis not present

## 2022-07-27 DIAGNOSIS — M47816 Spondylosis without myelopathy or radiculopathy, lumbar region: Secondary | ICD-10-CM | POA: Diagnosis not present

## 2022-07-27 DIAGNOSIS — Z8249 Family history of ischemic heart disease and other diseases of the circulatory system: Secondary | ICD-10-CM | POA: Diagnosis not present

## 2022-07-27 DIAGNOSIS — E785 Hyperlipidemia, unspecified: Secondary | ICD-10-CM | POA: Diagnosis not present

## 2022-07-27 DIAGNOSIS — E872 Acidosis, unspecified: Secondary | ICD-10-CM | POA: Diagnosis not present

## 2022-07-27 DIAGNOSIS — W19XXXA Unspecified fall, initial encounter: Secondary | ICD-10-CM | POA: Diagnosis not present

## 2022-07-27 DIAGNOSIS — Z79899 Other long term (current) drug therapy: Secondary | ICD-10-CM | POA: Diagnosis not present

## 2022-07-27 DIAGNOSIS — F419 Anxiety disorder, unspecified: Secondary | ICD-10-CM | POA: Diagnosis present

## 2022-07-27 DIAGNOSIS — M810 Age-related osteoporosis without current pathological fracture: Secondary | ICD-10-CM | POA: Diagnosis present

## 2022-07-27 DIAGNOSIS — E8809 Other disorders of plasma-protein metabolism, not elsewhere classified: Secondary | ICD-10-CM | POA: Diagnosis not present

## 2022-07-27 DIAGNOSIS — Y92009 Unspecified place in unspecified non-institutional (private) residence as the place of occurrence of the external cause: Secondary | ICD-10-CM | POA: Diagnosis not present

## 2022-07-27 DIAGNOSIS — Z86010 Personal history of colonic polyps: Secondary | ICD-10-CM | POA: Diagnosis not present

## 2022-07-27 DIAGNOSIS — R109 Unspecified abdominal pain: Secondary | ICD-10-CM | POA: Diagnosis not present

## 2022-07-27 DIAGNOSIS — W1830XA Fall on same level, unspecified, initial encounter: Secondary | ICD-10-CM | POA: Diagnosis present

## 2022-07-27 LAB — BASIC METABOLIC PANEL
Anion gap: 8 (ref 5–15)
BUN: 7 mg/dL — ABNORMAL LOW (ref 8–23)
CO2: 24 mmol/L (ref 22–32)
Calcium: 8.6 mg/dL — ABNORMAL LOW (ref 8.9–10.3)
Chloride: 93 mmol/L — ABNORMAL LOW (ref 98–111)
Creatinine, Ser: 0.54 mg/dL (ref 0.44–1.00)
GFR, Estimated: >60 mL/min (ref >60)
Glucose, Bld: 108 mg/dL — ABNORMAL HIGH (ref 70–99)
Potassium: 4.2 mmol/L (ref 3.5–5.1)
Sodium: 125 mmol/L — ABNORMAL LOW (ref 135–145)

## 2022-07-27 LAB — COMPREHENSIVE METABOLIC PANEL
ALT: 28 U/L (ref 0–44)
AST: 23 U/L (ref 15–41)
Albumin: 3.8 g/dL (ref 3.5–5.0)
Alkaline Phosphatase: 89 U/L (ref 38–126)
Anion gap: 7 (ref 5–15)
BUN: 6 mg/dL — ABNORMAL LOW (ref 8–23)
CO2: 24 mmol/L (ref 22–32)
Calcium: 8.9 mg/dL (ref 8.9–10.3)
Chloride: 93 mmol/L — ABNORMAL LOW (ref 98–111)
Creatinine, Ser: 0.49 mg/dL (ref 0.44–1.00)
GFR, Estimated: >60 mL/min (ref >60)
Glucose, Bld: 121 mg/dL — ABNORMAL HIGH (ref 70–99)
Potassium: 4.9 mmol/L (ref 3.5–5.1)
Sodium: 124 mmol/L — ABNORMAL LOW (ref 135–145)
Total Bilirubin: 0.9 mg/dL (ref 0.3–1.2)
Total Protein: 6.5 g/dL (ref 6.5–8.1)

## 2022-07-27 LAB — CBC WITH DIFFERENTIAL/PLATELET
Abs Immature Granulocytes: 0.03 10*3/uL (ref 0.00–0.07)
Basophils Absolute: 0.1 10*3/uL (ref 0.0–0.1)
Basophils Relative: 1 %
Eosinophils Absolute: 0.1 10*3/uL (ref 0.0–0.5)
Eosinophils Relative: 1 %
HCT: 34.4 % — ABNORMAL LOW (ref 36.0–46.0)
Hemoglobin: 11.9 g/dL — ABNORMAL LOW (ref 12.0–15.0)
Immature Granulocytes: 0 %
Lymphocytes Relative: 19 %
Lymphs Abs: 1.4 10*3/uL (ref 0.7–4.0)
MCH: 32.1 pg (ref 26.0–34.0)
MCHC: 34.6 g/dL (ref 30.0–36.0)
MCV: 92.7 fL (ref 80.0–100.0)
Monocytes Absolute: 0.5 10*3/uL (ref 0.1–1.0)
Monocytes Relative: 7 %
Neutro Abs: 5.6 10*3/uL (ref 1.7–7.7)
Neutrophils Relative %: 72 %
Platelets: 260 10*3/uL (ref 150–400)
RBC: 3.71 MIL/uL — ABNORMAL LOW (ref 3.87–5.11)
RDW: 12 % (ref 11.5–15.5)
WBC: 7.7 10*3/uL (ref 4.0–10.5)
nRBC: 0 % (ref 0.0–0.2)

## 2022-07-27 LAB — NA AND K (SODIUM & POTASSIUM), RAND UR
Potassium Urine: 10 mmol/L
Sodium, Ur: 38 mmol/L

## 2022-07-27 LAB — URINALYSIS, ROUTINE W REFLEX MICROSCOPIC
Bilirubin Urine: NEGATIVE
Glucose, UA: NEGATIVE mg/dL
Hgb urine dipstick: NEGATIVE
Ketones, ur: NEGATIVE mg/dL
Leukocytes,Ua: NEGATIVE
Nitrite: NEGATIVE
Protein, ur: NEGATIVE mg/dL
Specific Gravity, Urine: 1.003 — ABNORMAL LOW (ref 1.005–1.030)
pH: 7 (ref 5.0–8.0)

## 2022-07-27 LAB — LIPASE, BLOOD: Lipase: 24 U/L (ref 11–51)

## 2022-07-27 MED ORDER — ONDANSETRON HCL 4 MG/2ML IJ SOLN
4.0000 mg | Freq: Four times a day (QID) | INTRAMUSCULAR | Status: DC | PRN
  Administered 2022-07-27: 4 mg via INTRAVENOUS
  Filled 2022-07-27: qty 2

## 2022-07-27 MED ORDER — IOHEXOL 300 MG/ML  SOLN
80.0000 mL | Freq: Once | INTRAMUSCULAR | Status: AC | PRN
  Administered 2022-07-27: 80 mL via INTRAVENOUS

## 2022-07-27 MED ORDER — SODIUM CHLORIDE 0.9 % IV SOLN: INTRAVENOUS | Status: DC

## 2022-07-27 MED ORDER — ALPRAZOLAM 0.5 MG PO TABS
0.5000 mg | ORAL_TABLET | Freq: Two times a day (BID) | ORAL | Status: DC | PRN
  Administered 2022-07-27 – 2022-07-29 (×4): 0.5 mg via ORAL
  Filled 2022-07-27 (×4): qty 1

## 2022-07-27 MED ORDER — ONDANSETRON HCL 4 MG/2ML IJ SOLN
4.0000 mg | Freq: Once | INTRAMUSCULAR | Status: AC
  Administered 2022-07-27: 4 mg via INTRAVENOUS
  Filled 2022-07-27: qty 2

## 2022-07-27 MED ORDER — LIDOCAINE 5 % EX PTCH
1.0000 | MEDICATED_PATCH | CUTANEOUS | Status: DC
  Administered 2022-07-27 – 2022-07-28 (×2): 1 via TRANSDERMAL
  Filled 2022-07-27 (×2): qty 1

## 2022-07-27 MED ORDER — ONDANSETRON HCL 4 MG PO TABS: 4.0000 mg | ORAL_TABLET | Freq: Four times a day (QID) | ORAL | Status: DC | PRN

## 2022-07-27 MED ORDER — HYDROCODONE-ACETAMINOPHEN 5-325 MG PO TABS
1.0000 | ORAL_TABLET | Freq: Once | ORAL | Status: AC
  Administered 2022-07-27: 1 via ORAL
  Filled 2022-07-27: qty 1

## 2022-07-27 MED ORDER — POLYETHYLENE GLYCOL 3350 17 G PO PACK: 17.0000 g | PACK | Freq: Every day | ORAL | Status: DC | PRN

## 2022-07-27 MED ORDER — ACETAMINOPHEN 650 MG RE SUPP: 650.0000 mg | Freq: Four times a day (QID) | RECTAL | Status: DC | PRN

## 2022-07-27 MED ORDER — OXYCODONE HCL 5 MG PO TABS
5.0000 mg | ORAL_TABLET | Freq: Four times a day (QID) | ORAL | Status: DC | PRN
  Administered 2022-07-27 – 2022-07-29 (×7): 5 mg via ORAL
  Filled 2022-07-27 (×7): qty 1

## 2022-07-27 MED ORDER — METHOCARBAMOL 500 MG PO TABS
500.0000 mg | ORAL_TABLET | Freq: Three times a day (TID) | ORAL | Status: DC
  Administered 2022-07-27 – 2022-07-29 (×6): 500 mg via ORAL
  Filled 2022-07-27 (×6): qty 1

## 2022-07-27 MED ORDER — SODIUM CHLORIDE 0.9% FLUSH: 3.0000 mL | INTRAVENOUS | Status: DC | PRN

## 2022-07-27 MED ORDER — LIDOCAINE 5 % EX PTCH
1.0000 | MEDICATED_PATCH | CUTANEOUS | Status: DC
  Administered 2022-07-27: 1 via TRANSDERMAL
  Filled 2022-07-27: qty 1

## 2022-07-27 MED ORDER — HYDRALAZINE HCL 20 MG/ML IJ SOLN: 10.0000 mg | Freq: Four times a day (QID) | INTRAMUSCULAR | Status: DC | PRN

## 2022-07-27 MED ORDER — SODIUM CHLORIDE 0.9% FLUSH
3.0000 mL | Freq: Two times a day (BID) | INTRAVENOUS | Status: DC
  Administered 2022-07-27 – 2022-07-29 (×3): 3 mL via INTRAVENOUS

## 2022-07-27 MED ORDER — HEPARIN SODIUM (PORCINE) 5000 UNIT/ML IJ SOLN
5000.0000 [IU] | Freq: Three times a day (TID) | INTRAMUSCULAR | Status: DC
  Administered 2022-07-27 – 2022-07-29 (×5): 5000 [IU] via SUBCUTANEOUS
  Filled 2022-07-27 (×5): qty 1

## 2022-07-27 MED ORDER — ACETAMINOPHEN 325 MG PO TABS
650.0000 mg | ORAL_TABLET | Freq: Four times a day (QID) | ORAL | Status: DC | PRN
  Administered 2022-07-28: 650 mg via ORAL
  Filled 2022-07-27: qty 2

## 2022-07-27 MED ORDER — SODIUM CHLORIDE 0.9 % IV SOLN: INTRAVENOUS | Status: DC | PRN

## 2022-07-27 MED ORDER — OYSTER SHELL CALCIUM/D3 500-5 MG-MCG PO TABS
1.0000 | ORAL_TABLET | Freq: Every day | ORAL | Status: DC
  Administered 2022-07-28 – 2022-07-29 (×2): 1 via ORAL
  Filled 2022-07-27 (×4): qty 1

## 2022-07-27 MED ORDER — BISACODYL 10 MG RE SUPP: 10.0000 mg | Freq: Every day | RECTAL | Status: DC | PRN

## 2022-07-27 MED ORDER — PANTOPRAZOLE SODIUM 40 MG PO TBEC
40.0000 mg | DELAYED_RELEASE_TABLET | Freq: Every day | ORAL | Status: DC
  Administered 2022-07-27 – 2022-07-29 (×3): 40 mg via ORAL
  Filled 2022-07-27 (×3): qty 1

## 2022-07-27 NOTE — Progress Notes (Signed)
Orthopedic Tech Progress Note Patient Details:  Janet Bailey 19-Oct-1946 [id number]  Called in order to [employer] for a TLSO BRACE   Patient ID: Janet Bailey, female   DOB: September 15, 1946, 76 y.o.   MRN: [id number]  Janet Bailey 07/27/2022, 2:15 PM

## 2022-07-27 NOTE — ED Provider Notes (Signed)
[facility] EMERGENCY DEPARTMENT Provider Note  CSN: [id number] Arrival date & time: 07/27/22 4503  Chief Complaint(s) Back Pain and Emesis  HPI Janet Bailey is a 76 y.o. female with PMH HTN, depression, GERD, osteoporosis who presents emergency department for evaluation of multiple complaints including neck and back pain, fall and emesis.  Additional history obtained from patient's daughter and patient states that she was reaching around the wall to grab something when she fell and landed on her back.  The fall occurred approximately 3 days prior to ER arrival but she is having persistent low back pain prompting presentation to the ER.  Daughter states that since the death of the patient's husband she has been increasingly depressed and has had decreased p.o. intake.  Patient does state that she feels mildly unsteady on her feet   Past Medical History Past Medical History:  Diagnosis Date   Colon polyps    Depression    GERD (gastroesophageal reflux disease)    Hiatal hernia    Hypertension    Patient Active Problem List   Diagnosis Date Noted   Pelvic fracture ([place]) 05/15/2020   Closed fracture of right hip ([place]) 05/14/2020   Hyponatremia 05/14/2020   Hypotension 05/14/2020   Leukocytosis 05/14/2020   GERD (gastroesophageal reflux disease) 02/26/2014   Essential hypertension, benign 02/26/2014   Abdominal pain, right upper quadrant 02/26/2014   Abdominal pain, chronic, right lower quadrant 02/26/2014   Home Medication(s) Prior to Admission medications   Medication Sig Start Date End Date Taking? Authorizing Provider  amitriptyline (ELAVIL) 25 MG tablet Take 25 mg by mouth daily.    [provider]  Calcium Carbonate-Vitamin D 600-200 MG-UNIT TABS Take by mouth daily.    [provider]  cholecalciferol (VITAMIN D) 1000 UNITS tablet Take 1,000 Units by mouth 2 (two) times daily.    [provider]  clonazePAM (KLONOPIN) 0.5 MG tablet Take 0.5  mg by mouth 2 (two) times daily as needed for anxiety.    [provider]  estradiol (ESTRACE) 0.1 MG/GM vaginal cream 1 gram vaginally and a pea sized amount topically qhs x 2 weeks, then change to twice weekly 12/04/21   Salvadore Dom, MD  losartan (COZAAR) 50 MG tablet Take 1 tablet (50 mg total) by mouth daily. 07/21/22   O'Neal, Cassie Freer, MD  metoprolol succinate (TOPROL-XL) 25 MG 24 hr tablet Take 1 tablet (25 mg total) by mouth daily. 05/19/22   O'NealCassie Freer, MD  omeprazole (PRILOSEC) 40 MG capsule Take 1 capsule (40 mg total) by mouth 2 (two) times daily before a meal. 01/02/22   Irene Shipper, MD  spironolactone (ALDACTONE) 25 MG tablet Take 1 tablet (25 mg total) by mouth daily. 03/11/22   O'NealCassie Freer, MD  omeprazole (PRILOSEC) 20 MG capsule Take 20 mg by mouth 2 (two) times daily before a meal.    [provider]  Past Surgical History Past Surgical History:  Procedure Laterality Date   ABDOMINAL HYSTERECTOMY     cataract surgery     CHOLECYSTECTOMY     INGUINAL HERNIA REPAIR     tension free transvaginal tape procedure     Family History Family History  Problem Relation Age of Onset   Aneurysm Mother    Heart disease Father    Arrhythmia Sister    Heart disease Sister    Colon cancer Paternal Uncle    Diabetes Daughter        One has diabetes age 54. Other in good health    Social History Social History   Tobacco Use   Smoking status: Never   Smokeless tobacco: Never  Vaping Use   Vaping Use: Never used  Substance Use Topics   Alcohol use: No   Drug use: No   Allergies Patient has no known allergies.  Review of Systems Review of Systems  Constitutional:  Positive for fatigue.  Gastrointestinal:  Positive for nausea and vomiting.  Musculoskeletal:  Positive for back pain.    Physical  Exam Vital Signs  I have reviewed the triage vital signs BP (!) 140/52 (BP Location: Left Arm)   Pulse (!) 50   Temp 98.4 F (36.9 C) (Oral)   Resp 16   SpO2 98%   Physical Exam Vitals and nursing note reviewed.  Constitutional:      General: She is not in acute distress.    Appearance: She is well-developed.  HENT:     Head: Normocephalic and atraumatic.  Eyes:     Conjunctiva/sclera: Conjunctivae normal.  Cardiovascular:     Rate and Rhythm: Normal rate and regular rhythm.     Heart sounds: No murmur heard. Pulmonary:     Effort: Pulmonary effort is normal. No respiratory distress.     Breath sounds: Normal breath sounds.  Abdominal:     Palpations: Abdomen is soft.     Tenderness: There is no abdominal tenderness.  Musculoskeletal:        General: Tenderness present. No swelling.     Cervical back: Neck supple.  Skin:    General: Skin is warm and dry.     Capillary Refill: Capillary refill takes less than 2 seconds.  Neurological:     Mental Status: She is alert.  Psychiatric:        Mood and Affect: Mood normal.     ED Results and Treatments Labs (all labs ordered are listed, but only abnormal results are displayed) Labs Reviewed  CBC WITH DIFFERENTIAL/PLATELET - Abnormal; Notable for the following components:      Result Value   RBC 3.71 (*)    Hemoglobin 11.9 (*)    HCT 34.4 (*)    All other components within normal limits  COMPREHENSIVE METABOLIC PANEL - Abnormal; Notable for the following components:   Sodium 124 (*)    Chloride 93 (*)    Glucose, Bld 121 (*)    BUN 6 (*)    All other components within normal limits  URINALYSIS, ROUTINE W REFLEX MICROSCOPIC - Abnormal; Notable for the following components:   Color, Urine COLORLESS (*)    Specific Gravity, Urine 1.003 (*)    All other components within normal limits  LIPASE, BLOOD  Radiology No results found.  Pertinent labs & imaging results that were available during my care of the patient were reviewed by me and considered in my medical decision making (see MDM for details).  Medications Ordered in ED Medications - No data to display                                                                                                                                   Procedures Procedures  (including critical care time)  Medical Decision Making / ED Course   This patient presents to the ED for concern of fall, back pain, fatigue, vomiting, this involves an extensive number of treatment options, and is a complaint that carries with it a high risk of complications and morbidity.  The differential diagnosis includes electrolyte abnormality, persistent UTI, traumatic fracture, contusion, hematoma, pyelonephritis  MDM: Patient seen in the emergency department for evaluation of multiple complaints as described above.  Physical exam with tenderness in the L-spine but is otherwise unremarkable.  Laboratory evaluation concerning with a new hyponatremia to 124 and urine electrolytes have been sent.  Urinalysis unremarkable, hemoglobin 11.9.  CT abdomen pelvis with CT L-spine recons showing a new T12 compression fracture.  I spoke with Dr. Ronnald Ramp of neurosurgery who agrees with TLSO bracing and outpatient follow-up.  I have concern that the patient is an increased fall risk given her unsteadiness and new hyponatremia and patient will be admitted for correction of electrolytes, physical therapy and pain control in the setting of a new lumbar compression fracture.   Additional history obtained: -Additional history obtained from daughter -External records from outside source obtained and reviewed including: Chart review including previous notes, labs, imaging, consultation notes   Lab Tests: -I ordered, reviewed, and interpreted labs.   The pertinent results include:   Labs  Reviewed  CBC WITH DIFFERENTIAL/PLATELET - Abnormal; Notable for the following components:      Result Value   RBC 3.71 (*)    Hemoglobin 11.9 (*)    HCT 34.4 (*)    All other components within normal limits  COMPREHENSIVE METABOLIC PANEL - Abnormal; Notable for the following components:   Sodium 124 (*)    Chloride 93 (*)    Glucose, Bld 121 (*)    BUN 6 (*)    All other components within normal limits  URINALYSIS, ROUTINE W REFLEX MICROSCOPIC - Abnormal; Notable for the following components:   Color, Urine COLORLESS (*)    Specific Gravity, Urine 1.003 (*)    All other components within normal limits  LIPASE, BLOOD      EKG   EKG Interpretation  Date/Time:    Ventricular Rate:    PR Interval:    QRS Duration:   QT Interval:    QTC Calculation:   R Axis:     Text Interpretation:           Imaging Studies ordered: I ordered imaging  studies including CT L-spine, CTA abdomen pelvis I independently visualized and interpreted imaging. I agree with the radiologist interpretation   Medicines ordered and prescription drug management: No orders of the defined types were placed in this encounter.   -I have reviewed the patients home medicines and have made adjustments as needed  Critical interventions none  Consultations Obtained: I requested consultation with the neurosurgeon on-call Dr. Ronnald Ramp,  and discussed lab and imaging findings as well as pertinent plan - they recommend: TLSO bracing and outpatient follow-up   Cardiac Monitoring: The patient was maintained on a cardiac monitor.  I personally viewed and interpreted the cardiac monitored which showed an underlying rhythm of: NSR  Social Determinants of Health:  Factors impacting patients care include: none   Reevaluation: After the interventions noted above, I reevaluated the patient and found that they have :improved  Co morbidities that complicate the patient evaluation  Past Medical History:   Diagnosis Date   Colon polyps    Depression    GERD (gastroesophageal reflux disease)    Hiatal hernia    Hypertension       Dispostion: I considered admission for this patient, and due to new significant hyponatremia, unsteadiness and new lumbar compression fracture, patient require hospital admission     Final Clinical Impression(s) / ED Diagnoses Final diagnoses:  None     '@PCDICTATION'$ @    Teressa Lower, MD 07/27/22 [phone]

## 2022-07-27 NOTE — ED Triage Notes (Signed)
Pt c/o losing balance and fell Saturday two days ago and fell onto back and left side. Pt c/o mid to lower back pain. States has been using ice/heat and tylenol with no relief. A/o. States starting vomiting last night. X 3 emesis. Nad at this time. No bruising/obvious deformities noted.

## 2022-07-27 NOTE — H&P (Signed)
Patient Demographics:    Janet Bailey, is a 76 y.o. female  MRN: [id number]   DOB - 1946/03/24  Admit Date - 07/27/2022  Outpatient Primary MD for the patient is Jettie Booze, NP   Assessment & Plan:   Assessment and Plan:    1)Symptomatic Hyponatremia/Hypochloremia --- sodium is down to 124, sodium was 138 on 03/07/22 -Chloride is 93 it was 103 on Mar 07, 2022 -LFTs WNL -Suspect dehydration related in setting of poor oral intake, intractable emesis on 07/26/22 and diarrhea on 07/27/22 and Aldactone use -Urine sodium 38, urine potassium 10 -Hold Aldactone, hold Prozac -IV fluids as ordered until electrolytes improves and when oral intake is more reliable -Serial sodium levels  2)Syncope in the setting of dehydration and hyponatremia--Echo from 01/01/2022 with EF of 50 to 65% without regional wall motion abnormalities, grade 1 diastolic dysfunction noted..  - No aortic stenosis and no mitral stenosis -Monitor on telemetry -12 Lead EKG requested---showed sinus bradycardia, ??  If symptomatic bradycardia contributed to patient's syncope/fall -Hold metoprolol -CT head requested--shows no acute findings however does show Remote subcortical infarcts within the left basal ganglia and left periventricular white matter   3)Mid Back Pain--CT of lumbar spine and CT abdomen and pelvis with contrast shows-Acute compression fracture of the T12 vertebral body with approximately 20% loss of vertebral body height and minimal bony retropulsion but no significant canal stenosis. -EDP discussed case with on-call neurosurgeon Dr. Sherley Bounds, he recommends TSLO brace and outpt fu for the T12 fx -Methocarbamol and hydrocodone as prescribed as needed -Physical therapy eval  4)HTN--- BP is currently soft with systolic blood pressure  193, patient bradycardic with heart rate of 50 -Hold metoprolol (due to bradycardia), hold Aldactone due to hyponatremia and dehydration and hold losartan due to soft BP  5)GERD--- continue Protonix  6) depression/anxiety--- hold Prozac due to hyponatremia, may use Xanax as needed  7)H/o Remote subcortical infarcts within the left basal ganglia and left periventricular white matter -- Patient and daughter does not recall any clinical symptoms of stroke in the past -Patient will benefit from aspirin and statin for secondary stroke prophylaxis Even if her lipid panel is within desired limits, patient should still take Statin for it's Pleiotropic effects (beyond cholesterol lowering benefits)    Disposition/Need for in-Hospital Stay- patient unable to be discharged at this time due to -concerns for syncope in the setting of hyponatremia requiring IV fluids and further investigations and monitoring --IV fluids as ordered until electrolytes improves and  when oral intake is more reliable  Status is: Inpatient  Remains inpatient appropriate because:   Dispo: The patient is from: Home              Anticipated d/c is to: Home              Anticipated d/c date is: 2 days              Patient currently  is not medically stable to d/c. Barriers: Not Clinically Stable-    With History of - Reviewed by me  Past Medical History:  Diagnosis Date   Colon polyps    Depression    GERD (gastroesophageal reflux disease)    Hiatal hernia    Hypertension       Past Surgical History:  Procedure Laterality Date   ABDOMINAL HYSTERECTOMY     cataract surgery     CHOLECYSTECTOMY     INGUINAL HERNIA REPAIR     tension free transvaginal tape procedure      Chief Complaint  Patient presents with   Back Pain   Emesis      HPI:    Janet Bailey  is a 76 y.o. female with past medical history relevant for hypertension, depression/anxiety, and GERD presents to the ED with concerns about back  pain after falling at home on Saturday, 07/25/2022 -Patient discharged nausea and vomiting for the last couple days as well -Patient does not think she hit her head she thinks she fell on the back on the left side -Pain is mostly from the lower thoracic to the upper lumbar areas -Denies any bleeding bruising or other obvious injury/deformity -No chest pain palpitations no dizziness, no diarrhea -In the ED sodium is down to 124, sodium was 138 on 03/07/22 -Chloride is 93 it was 103 on Mar 07, 2022 -LFTs WNL --Urine sodium 38, urine potassium 10 -12 Lead EKG requested -CT head requested --CT of lumbar spine and CT abdomen and pelvis with contrast shows-Acute compression fracture of the T12 vertebral body with approximately 20% loss of vertebral body height and minimal bony retropulsion but no significant canal stenosis. -EDP discussed case with on-call neurosurgeon Dr. Sherley Bounds, he recommends TSLO brace and outpt fu for the T12 fx -UA is not suggestive of UTI -Lipase is not elevated, WBC 7.7 hemoglobin 11.9 which is close to baseline, platelets 260 - Additional history obtained from patient's daughter at bedside-----patient apparently had intractable emesis on 07/26/22 and diarrhea on 07/27/22    Review of systems:    In addition to the HPI above,   A full Review of  Systems was done, all other systems reviewed are negative except as noted above in HPI , .    Social History:  Reviewed by me    Social History   Tobacco Use   Smoking status: Never   Smokeless tobacco: Never  Substance Use Topics   Alcohol use: No       Family History :  Reviewed by me    Family History  Problem Relation Age of Onset   Aneurysm Mother    Heart disease Father    Arrhythmia Sister    Heart disease Sister    Colon cancer Paternal Uncle    Diabetes Daughter        One has diabetes age 19. Other in good health     Home Medications:   Prior to Admission medications   Medication Sig Start  Date End Date Taking? Authorizing Provider  Calcium Carbonate-Vitamin D 600-200 MG-UNIT TABS Take by mouth daily.   Yes [provider]  cholecalciferol (VITAMIN D) 1000 UNITS tablet Take 1,000 Units by mouth 2 (two) times daily.   Yes [provider]  clonazePAM (KLONOPIN) 0.5 MG tablet Take 0.5 mg by mouth 2 (two) times daily as needed for anxiety.   Yes [provider]  Cyanocobalamin (B-12 PO) Take 1 tablet by mouth daily.   Yes  [provider]  FLUoxetine (PROZAC) 10 MG tablet Take 10 mg by mouth daily.   Yes [provider]  losartan (COZAAR) 50 MG tablet Take 1 tablet (50 mg total) by mouth daily. 07/21/22  Yes O'Neal, Cassie Freer, MD  metoprolol succinate (TOPROL-XL) 25 MG 24 hr tablet Take 1 tablet (25 mg total) by mouth daily. 05/19/22  Yes O'Neal, Cassie Freer, MD  omeprazole (PRILOSEC) 40 MG capsule Take 1 capsule (40 mg total) by mouth 2 (two) times daily before a meal. Patient taking differently: Take 20 mg by mouth in the morning and at bedtime. 01/02/22  Yes Irene Shipper, MD  spironolactone (ALDACTONE) 25 MG tablet Take 1 tablet (25 mg total) by mouth daily. 03/11/22  Yes O'Neal, Cassie Freer, MD  omeprazole (PRILOSEC) 20 MG capsule Take 20 mg by mouth 2 (two) times daily before a meal.    [provider]     Allergies:    No Known Allergies   Physical Exam:   Vitals  Blood pressure 107/73, pulse (!) 50, temperature 98.4 F (36.9 C), temperature source Oral, resp. rate 15, SpO2 91 %.  Physical Examination: General appearance - alert,  in no distress  Mental status - alert, oriented to person, place, and time,  Eyes - sclera anicteric Neck - supple, no JVD elevation , Chest - clear  to auscultation bilaterally, symmetrical air movement,  Heart - S1 and S2 normal, regular , bradycardic Abdomen - soft, nontender, nondistended, +BS Neurological - screening mental status exam normal, neck supple without rigidity, cranial  nerves II through XII intact, DTR's normal and symmetric Extremities - no pedal edema noted, intact peripheral pulses  Skin - warm, dry MSK-lower thoracic and upper lumbar area reproducible back pain on palpation, no deformities or crepitus noted     Data Review:    CBC Recent Labs  Lab 07/27/22 1114  WBC 7.7  HGB 11.9*  HCT 34.4*  PLT 260  MCV 92.7  MCH 32.1  MCHC 34.6  RDW 12.0  LYMPHSABS 1.4  MONOABS 0.5  EOSABS 0.1  BASOSABS 0.1   ------------------------------------------------------------------------------------------------------------------  Chemistries  Recent Labs  Lab 07/27/22 1114  NA 124*  K 4.9  CL 93*  CO2 24  GLUCOSE 121*  BUN 6*  CREATININE 0.49  CALCIUM 8.9  AST 23  ALT 28  ALKPHOS 89  BILITOT 0.9   Urinalysis    Component Value Date/Time   COLORURINE COLORLESS (A) 07/27/2022 1114   APPEARANCEUR CLEAR 07/27/2022 1114   LABSPEC 1.003 (L) 07/27/2022 1114   PHURINE 7.0 07/27/2022 1114   GLUCOSEU NEGATIVE 07/27/2022 1114   HGBUR NEGATIVE 07/27/2022 1114   BILIRUBINUR NEGATIVE 07/27/2022 1114   KETONESUR NEGATIVE 07/27/2022 1114   PROTEINUR NEGATIVE 07/27/2022 1114   NITRITE NEGATIVE 07/27/2022 1114   LEUKOCYTESUR NEGATIVE 07/27/2022 1114     Imaging Results:    CT L-SPINE NO CHARGE  Result Date: 07/27/2022 CLINICAL DATA:  Fall on 07/25/2022 EXAM: CT LUMBAR SPINE WITHOUT CONTRAST TECHNIQUE: Multidetector CT imaging of the lumbar spine was performed without intravenous contrast administration. Multiplanar CT image reconstructions were also generated. RADIATION DOSE REDUCTION: This exam was performed according to the departmental dose-optimization program which includes automated exposure control, adjustment of the mA and/or kV according to patient size and/or use of iterative reconstruction technique. COMPARISON:  CT abdomen/pelvis 11/16/2018 FINDINGS: Segmentation: Standard; the lowest formed disc space is designated L5-S1. Alignment: There  is mild levocurvature centered at L1-L2. There is no antero or retrolisthesis. There  is no jumped or perched facet or other evidence of traumatic malalignment. Vertebrae: There is compression deformity inferior T12 endplate with approximately 20% loss of vertebral height and minimal bony retropulsion but no significant spinal canal stenosis. There is no extension posterior elements. The other vertebral body heights are preserved, without other evidence of acute fracture. There is no osseous lesion. Paraspinal and other soft tissues: The paraspinal soft tissues are unremarkable. The abdominal and pelvic viscera are assessed on the separately dictated CT abdomen pelvis. Disc levels: There is multilevel disc space narrowing throughout the imaged lower thoracic spine and most advanced lumbar spine at L1-L2 and L2-L3 with multilevel vacuum disc phenomenon. There is overall mild multilevel facet arthropathy. There are mild disc bulges in the lumbar spine without evidence of significant spinal canal stenosis there is up to mild-to-moderate left neural foraminal at L4-L5. IMPRESSION: 1. Acute compression fracture of the T12 vertebral body with approximately 20% loss of vertebral body height and minimal bony retropulsion but no significant canal stenosis. 2. Overall mild multilevel degenerative changes as detailed above. Electronically Signed   By: Valetta Mole M.D.   On: 07/27/2022 14:26   CT ABDOMEN PELVIS W CONTRAST  Result Date: 07/27/2022 CLINICAL DATA:  Fall on 07/25/2022, low back pain. EXAM: CT ABDOMEN AND PELVIS WITH CONTRAST TECHNIQUE: Multidetector CT imaging of the abdomen and pelvis was performed using the standard protocol following bolus administration of intravenous contrast. RADIATION DOSE REDUCTION: This exam was performed according to the departmental dose-optimization program which includes automated exposure control, adjustment of the mA and/or kV according to patient size and/or use of iterative  reconstruction technique. CONTRAST:  39m OMNIPAQUE IOHEXOL 300 MG/ML  SOLN COMPARISON:  CT abdomen/pelvis 11/16/2018 FINDINGS: Lower chest: The imaged lung bases are clear. Mild aortic valve and coronary artery calcifications are noted the imaged heart is otherwise unremarkable. Hepatobiliary: The liver is unremarkable. The gallbladder is surgically absent, accounting for the mild intra and extrahepatic biliary ductal dilatation. Pancreas: Unremarkable. Spleen: Unremarkable. Adrenals/Urinary Tract: The adrenals are unremarkable. The kidneys are unremarkable, with no focal lesion, stone, hydronephrosis, or hydroureter. The kidneys enhance symmetrically. There is symmetric excretion of contrast into the collecting systems on the delayed images. The bladder is unremarkable. Stomach/Bowel: There is moderate-sized hiatal hernia. There is no evidence of bowel obstruction. There is no abnormal bowel wall thickening or inflammatory change. The colon is redundant with cecum located in the left lower quadrant, similar to the prior study. Vascular/Lymphatic: There is scattered calcified atherosclerotic plaque in the nonaneurysmal abdominal aorta. The major branch vessels are patent. The main portal and splenic veins are patent. There is no abdominopelvic lymphadenopathy. Reproductive: The uterus is surgically absent. There is a 1.3 cm cyst in the right adnexa for which no specific imaging follow-up is required. There is no left adnexal mass. Other: There is no ascites or free air. Musculoskeletal: There is a remote fracture of the right inferior pubic ramus which is new since 2020. There is an acute compression fracture of the T12 vertebral body with approximately 20% loss of vertebral body height and minimal bony retropulsion without significant spinal canal stenosis. There is no other acute osseous abnormality. The lumbar spine is assessed in full on the separately dictated lumbar spine CT. IMPRESSION: 1. Acute compression  fracture of the T12 vertebral body with approximately 20% loss of vertebral body height and minimal bony retropulsion but no significant canal stenosis. 2. No other acute findings in the abdomen or pelvis. 3. Moderate-sized hiatal hernia. 4.  Remote fracture of the right inferior pubic ramus is new since 2020. Electronically Signed   By: Valetta Mole M.D.   On: 07/27/2022 13:53    Radiological Exams on Admission: CT L-SPINE NO CHARGE  Result Date: 07/27/2022 CLINICAL DATA:  Fall on 07/25/2022 EXAM: CT LUMBAR SPINE WITHOUT CONTRAST TECHNIQUE: Multidetector CT imaging of the lumbar spine was performed without intravenous contrast administration. Multiplanar CT image reconstructions were also generated. RADIATION DOSE REDUCTION: This exam was performed according to the departmental dose-optimization program which includes automated exposure control, adjustment of the mA and/or kV according to patient size and/or use of iterative reconstruction technique. COMPARISON:  CT abdomen/pelvis 11/16/2018 FINDINGS: Segmentation: Standard; the lowest formed disc space is designated L5-S1. Alignment: There is mild levocurvature centered at L1-L2. There is no antero or retrolisthesis. There is no jumped or perched facet or other evidence of traumatic malalignment. Vertebrae: There is compression deformity inferior T12 endplate with approximately 20% loss of vertebral height and minimal bony retropulsion but no significant spinal canal stenosis. There is no extension posterior elements. The other vertebral body heights are preserved, without other evidence of acute fracture. There is no osseous lesion. Paraspinal and other soft tissues: The paraspinal soft tissues are unremarkable. The abdominal and pelvic viscera are assessed on the separately dictated CT abdomen pelvis. Disc levels: There is multilevel disc space narrowing throughout the imaged lower thoracic spine and most advanced lumbar spine at L1-L2 and L2-L3 with  multilevel vacuum disc phenomenon. There is overall mild multilevel facet arthropathy. There are mild disc bulges in the lumbar spine without evidence of significant spinal canal stenosis there is up to mild-to-moderate left neural foraminal at L4-L5. IMPRESSION: 1. Acute compression fracture of the T12 vertebral body with approximately 20% loss of vertebral body height and minimal bony retropulsion but no significant canal stenosis. 2. Overall mild multilevel degenerative changes as detailed above. Electronically Signed   By: Valetta Mole M.D.   On: 07/27/2022 14:26   CT ABDOMEN PELVIS W CONTRAST  Result Date: 07/27/2022 CLINICAL DATA:  Fall on 07/25/2022, low back pain. EXAM: CT ABDOMEN AND PELVIS WITH CONTRAST TECHNIQUE: Multidetector CT imaging of the abdomen and pelvis was performed using the standard protocol following bolus administration of intravenous contrast. RADIATION DOSE REDUCTION: This exam was performed according to the departmental dose-optimization program which includes automated exposure control, adjustment of the mA and/or kV according to patient size and/or use of iterative reconstruction technique. CONTRAST:  36m OMNIPAQUE IOHEXOL 300 MG/ML  SOLN COMPARISON:  CT abdomen/pelvis 11/16/2018 FINDINGS: Lower chest: The imaged lung bases are clear. Mild aortic valve and coronary artery calcifications are noted the imaged heart is otherwise unremarkable. Hepatobiliary: The liver is unremarkable. The gallbladder is surgically absent, accounting for the mild intra and extrahepatic biliary ductal dilatation. Pancreas: Unremarkable. Spleen: Unremarkable. Adrenals/Urinary Tract: The adrenals are unremarkable. The kidneys are unremarkable, with no focal lesion, stone, hydronephrosis, or hydroureter. The kidneys enhance symmetrically. There is symmetric excretion of contrast into the collecting systems on the delayed images. The bladder is unremarkable. Stomach/Bowel: There is moderate-sized hiatal  hernia. There is no evidence of bowel obstruction. There is no abnormal bowel wall thickening or inflammatory change. The colon is redundant with cecum located in the left lower quadrant, similar to the prior study. Vascular/Lymphatic: There is scattered calcified atherosclerotic plaque in the nonaneurysmal abdominal aorta. The major branch vessels are patent. The main portal and splenic veins are patent. There is no abdominopelvic lymphadenopathy. Reproductive: The uterus is surgically absent. There  is a 1.3 cm cyst in the right adnexa for which no specific imaging follow-up is required. There is no left adnexal mass. Other: There is no ascites or free air. Musculoskeletal: There is a remote fracture of the right inferior pubic ramus which is new since 2020. There is an acute compression fracture of the T12 vertebral body with approximately 20% loss of vertebral body height and minimal bony retropulsion without significant spinal canal stenosis. There is no other acute osseous abnormality. The lumbar spine is assessed in full on the separately dictated lumbar spine CT. IMPRESSION: 1. Acute compression fracture of the T12 vertebral body with approximately 20% loss of vertebral body height and minimal bony retropulsion but no significant canal stenosis. 2. No other acute findings in the abdomen or pelvis. 3. Moderate-sized hiatal hernia. 4. Remote fracture of the right inferior pubic ramus is new since 2020. Electronically Signed   By: Valetta Mole M.D.   On: 07/27/2022 13:53    DVT Prophylaxis -SCD/Heparin AM Labs Ordered, also please review Full Orders  Family Communication: Admission, patients condition and plan of care including tests being ordered have been discussed with the patient who indicate understanding and agree with the plan   Condition   -stable  Roxan Hockey M.D on 07/27/2022 at 5:35 PM Go to [email or web address] -  for contact info  [facility] - Office  [phone]

## 2022-07-27 NOTE — ED Notes (Signed)
Paged ortho for TLSO brace

## 2022-07-27 NOTE — ED Notes (Signed)
Ortho called and informed of order for TLSO brace

## 2022-07-27 NOTE — ED Notes (Signed)
Patient transported to CT 

## 2022-07-27 NOTE — ED Notes (Signed)
Hospitalist at bedside 

## 2022-07-28 DIAGNOSIS — I10 Essential (primary) hypertension: Secondary | ICD-10-CM | POA: Diagnosis not present

## 2022-07-28 DIAGNOSIS — Y92009 Unspecified place in unspecified non-institutional (private) residence as the place of occurrence of the external cause: Secondary | ICD-10-CM

## 2022-07-28 DIAGNOSIS — W19XXXA Unspecified fall, initial encounter: Secondary | ICD-10-CM | POA: Diagnosis not present

## 2022-07-28 DIAGNOSIS — S22089A Unspecified fracture of T11-T12 vertebra, initial encounter for closed fracture: Secondary | ICD-10-CM

## 2022-07-28 DIAGNOSIS — E871 Hypo-osmolality and hyponatremia: Secondary | ICD-10-CM | POA: Diagnosis not present

## 2022-07-28 LAB — LIPID PANEL
Cholesterol: 145 mg/dL (ref 0–200)
HDL: 25 mg/dL — ABNORMAL LOW (ref >40)
LDL Cholesterol: 71 mg/dL (ref 0–99)
Total CHOL/HDL Ratio: 5.8 RATIO
Triglycerides: 243 mg/dL — ABNORMAL HIGH (ref <150)
VLDL: 49 mg/dL — ABNORMAL HIGH (ref 0–40)

## 2022-07-28 LAB — CBC WITH DIFFERENTIAL/PLATELET
Abs Immature Granulocytes: 0.01 10*3/uL (ref 0.00–0.07)
Basophils Absolute: 0.1 10*3/uL (ref 0.0–0.1)
Basophils Relative: 1 %
Eosinophils Absolute: 0.1 10*3/uL (ref 0.0–0.5)
Eosinophils Relative: 2 %
HCT: 32.9 % — ABNORMAL LOW (ref 36.0–46.0)
Hemoglobin: 11.2 g/dL — ABNORMAL LOW (ref 12.0–15.0)
Immature Granulocytes: 0 %
Lymphocytes Relative: 28 %
Lymphs Abs: 1.2 10*3/uL (ref 0.7–4.0)
MCH: 32.3 pg (ref 26.0–34.0)
MCHC: 34 g/dL (ref 30.0–36.0)
MCV: 94.8 fL (ref 80.0–100.0)
Monocytes Absolute: 0.4 10*3/uL (ref 0.1–1.0)
Monocytes Relative: 9 %
Neutro Abs: 2.7 10*3/uL (ref 1.7–7.7)
Neutrophils Relative %: 60 %
Platelets: 217 10*3/uL (ref 150–400)
RBC: 3.47 MIL/uL — ABNORMAL LOW (ref 3.87–5.11)
RDW: 12.3 % (ref 11.5–15.5)
WBC: 4.5 10*3/uL (ref 4.0–10.5)
nRBC: 0 % (ref 0.0–0.2)

## 2022-07-28 LAB — BASIC METABOLIC PANEL
Anion gap: 6 (ref 5–15)
Anion gap: 6 (ref 5–15)
BUN: 8 mg/dL (ref 8–23)
BUN: 8 mg/dL (ref 8–23)
CO2: 23 mmol/L (ref 22–32)
CO2: 24 mmol/L (ref 22–32)
Calcium: 8.4 mg/dL — ABNORMAL LOW (ref 8.9–10.3)
Calcium: 8.2 mg/dL — ABNORMAL LOW (ref 8.9–10.3)
Chloride: 98 mmol/L (ref 98–111)
Chloride: 100 mmol/L (ref 98–111)
Creatinine, Ser: 0.49 mg/dL (ref 0.44–1.00)
Creatinine, Ser: 0.63 mg/dL (ref 0.44–1.00)
GFR, Estimated: >60 mL/min (ref >60)
GFR, Estimated: >60 mL/min (ref >60)
Glucose, Bld: 94 mg/dL (ref 70–99)
Glucose, Bld: 97 mg/dL (ref 70–99)
Potassium: 4.3 mmol/L (ref 3.5–5.1)
Potassium: 4.5 mmol/L (ref 3.5–5.1)
Sodium: 127 mmol/L — ABNORMAL LOW (ref 135–145)
Sodium: 130 mmol/L — ABNORMAL LOW (ref 135–145)

## 2022-07-28 LAB — MAGNESIUM: Magnesium: 1.8 mg/dL (ref 1.7–2.4)

## 2022-07-28 LAB — COMPREHENSIVE METABOLIC PANEL
ALT: 20 U/L (ref 0–44)
AST: 12 U/L — ABNORMAL LOW (ref 15–41)
Albumin: 3.3 g/dL — ABNORMAL LOW (ref 3.5–5.0)
Alkaline Phosphatase: 79 U/L (ref 38–126)
Anion gap: 7 (ref 5–15)
BUN: 7 mg/dL — ABNORMAL LOW (ref 8–23)
CO2: 21 mmol/L — ABNORMAL LOW (ref 22–32)
Calcium: 8.4 mg/dL — ABNORMAL LOW (ref 8.9–10.3)
Chloride: 97 mmol/L — ABNORMAL LOW (ref 98–111)
Creatinine, Ser: 0.45 mg/dL (ref 0.44–1.00)
GFR, Estimated: >60 mL/min (ref >60)
Glucose, Bld: 122 mg/dL — ABNORMAL HIGH (ref 70–99)
Potassium: 4.5 mmol/L (ref 3.5–5.1)
Sodium: 125 mmol/L — ABNORMAL LOW (ref 135–145)
Total Bilirubin: 0.9 mg/dL (ref 0.3–1.2)
Total Protein: 5.9 g/dL — ABNORMAL LOW (ref 6.5–8.1)

## 2022-07-28 LAB — PHOSPHORUS: Phosphorus: 3.5 mg/dL (ref 2.5–4.6)

## 2022-07-28 MED ORDER — ALUM & MAG HYDROXIDE-SIMETH 200-200-20 MG/5ML PO SUSP
15.0000 mL | Freq: Four times a day (QID) | ORAL | Status: DC | PRN
  Administered 2022-07-28 – 2022-07-29 (×2): 15 mL via ORAL
  Filled 2022-07-28 (×2): qty 30

## 2022-07-28 NOTE — Evaluation (Signed)
Physical Therapy Evaluation Patient Details Name: Janet Bailey MRN: [id number] DOB: May 07, 1946 Today's Date: 07/28/2022  History of Present Illness  Janet Bailey  is a 76 y.o. female with past medical history relevant for hypertension, depression/anxiety, and GERD presents to the ED with concerns about back pain after falling at home on Saturday, 07/25/2022    -CT head requested  --CT of lumbar spine and CT abdomen and pelvis with contrast shows-Acute compression fracture of the T12 vertebral body with approximately 20% loss of vertebral body height and minimal bony retropulsion but no significant canal stenosis.   Clinical Impression  Patient requires Mod/max assist for donning TLSO brace while seated in bed in long sitting position, other than that demonstrates good return for sitting up at bedside, transferring to chair and ambulating in hallway without loss of balance without need for an AD. Patient encouraged to stay out of bed and ambulate with nursing staff as tolerated.  Patient will benefit from continued skilled physical therapy in hospital and recommended venue below to increase strength, balance, endurance for safe ADLs and gait.      Recommendations for follow up therapy are one component of a multi-disciplinary discharge planning process, led by the attending physician.  Recommendations may be updated based on patient status, additional functional criteria and insurance authorization.  Follow Up Recommendations Home health PT      Assistance Recommended at Discharge Set up Supervision/Assistance  Patient can return home with the following  A little help with walking and/or transfers;A little help with bathing/dressing/bathroom;Help with stairs or ramp for entrance;Assistance with cooking/housework    Equipment Recommendations None recommended by PT  Recommendations for Other Services       Functional Status Assessment Patient has had a recent decline in their functional  status and demonstrates the ability to make significant improvements in function in a reasonable and predictable amount of time.     Precautions / Restrictions Precautions Precautions: Fall Required Braces or Orthoses: Spinal Brace Spinal Brace: Thoracolumbosacral orthotic Restrictions Weight Bearing Restrictions: No      Mobility  Bed Mobility Overal bed mobility: Needs Assistance Bed Mobility: Supine to Sit     Supine to sit: Supervision, Min assist     General bed mobility comments: increased time, labored movement    Transfers Overall transfer level: Modified independent                 General transfer comment: slightly labored movement    Ambulation/Gait Ambulation/Gait assistance: Supervision Gait Distance (Feet): 85 Feet Assistive device: None Gait Pattern/deviations: Decreased step length - right, Decreased step length - left, Decreased stride length Gait velocity: decreased     General Gait Details: slightly labored cadence with good return for ambulation in room and hallway without loss of balance  Stairs            Wheelchair Mobility    Modified Rankin (Stroke Patients Only)       Balance Overall balance assessment: No apparent balance deficits (not formally assessed)                                           Pertinent Vitals/Pain Pain Assessment Pain Assessment: 0-10 Pain Score: 6  Pain Location: back Pain Descriptors / Indicators: Sore Pain Intervention(s): Limited activity within patient's tolerance, Monitored during session, Repositioned    Home Living Family/patient expects to be discharged to::  Private residence Living Arrangements: Alone Available Help at Discharge: Family;Available PRN/intermittently Type of Home: House Home Access: Stairs to enter Entrance Stairs-Rails: Can reach both Entrance Stairs-Number of Steps: 3   Home Layout: One level Home Equipment: Conservation officer, nature (2 wheels)       Prior Function Prior Level of Function : Independent/Modified Independent             Mobility Comments: Hydrographic surveyor without AD ADLs Comments: Indpendent     Hand Dominance   Dominant Hand: Left    Extremity/Trunk Assessment   Upper Extremity Assessment Upper Extremity Assessment: Defer to OT evaluation    Lower Extremity Assessment Lower Extremity Assessment: Overall WFL for tasks assessed    Cervical / Trunk Assessment Cervical / Trunk Assessment: Normal  Communication   Communication: HOH  Cognition Arousal/Alertness: Awake/alert Behavior During Therapy: WFL for tasks assessed/performed Overall Cognitive Status: Within Functional Limits for tasks assessed                                          General Comments      Exercises     Assessment/Plan    PT Assessment Patient needs continued PT services  PT Problem List Decreased strength;Decreased activity tolerance;Decreased balance;Decreased mobility       PT Treatment Interventions DME instruction;Gait training;Stair training;Functional mobility training;Therapeutic activities;Therapeutic exercise;Balance training;Patient/family education    PT Goals (Current goals can be found in the Care Plan section)  Acute Rehab PT Goals Patient Stated Goal: return home with family to assist PT Goal Formulation: With patient Time For Goal Achievement: 07/30/22 Potential to Achieve Goals: Good    Frequency Min 3X/week     Co-evaluation PT/OT/SLP Co-Evaluation/Treatment: Yes Reason for Co-Treatment: To address functional/ADL transfers PT goals addressed during session: Mobility/safety with mobility;Balance         AM-PAC PT "6 Clicks" Mobility  Outcome Measure Help needed turning from your back to your side while in a flat bed without using bedrails?: None Help needed moving from lying on your back to sitting on the side of a flat bed without using bedrails?: A Little Help needed  moving to and from a bed to a chair (including a wheelchair)?: None Help needed standing up from a chair using your arms (e.g., wheelchair or bedside chair)?: None Help needed to walk in hospital room?: A Little Help needed climbing 3-5 steps with a railing? : A Little 6 Click Score: 21    End of Session   Activity Tolerance: Patient tolerated treatment well;Patient limited by fatigue Patient left: in bed;with call bell/phone within reach Nurse Communication: Mobility status PT Visit Diagnosis: Unsteadiness on feet (R26.81);Other abnormalities of gait and mobility (R26.89);Muscle weakness (generalized) (M62.81)    Time: 9030-0923 PT Time Calculation (min) (ACUTE ONLY): 21 min   Charges:   PT Evaluation $PT Eval Moderate Complexity: 1 Mod PT Treatments $Therapeutic Activity: 8-22 mins        1:58 PM, 07/28/22 Lonell Grandchild, MPT Physical Therapist with [facility] [phone] office [phone] mobile phone

## 2022-07-28 NOTE — TOC Progression Note (Signed)
Transition of Care [facility]) - Progression Note    Patient Details  Name: Janet Bailey MRN: [id number] Date of Birth: Nov 13, 1945  [facility]) CM/SW Contact  Salome Arnt, [place] Phone Number: 07/28/2022, 9:41 AM  Clinical Narrative:  PT recommending HHPT. Discussed with pt who states her daughter will be staying with her and she doesn't feel she will need any therapy. Pt aware to notify TOC if she changes her mind.           Expected Discharge Plan and Services                                                 Social Determinants of Health (SDOH) Interventions    Readmission Risk Interventions     No data to display

## 2022-07-28 NOTE — Plan of Care (Signed)
  Problem: Acute Rehab PT Goals(only PT should resolve) Goal: Pt Will Go Supine/Side To Sit Outcome: Progressing Flowsheets (Taken 07/28/2022 1359) Pt will go Supine/Side to Sit:  with supervision  with modified independence Goal: Patient Will Transfer Sit To/From Stand Outcome: Progressing Flowsheets (Taken 07/28/2022 1359) Patient will transfer sit to/from stand:  with modified independence  Independently Goal: Pt Will Transfer Bed To Chair/Chair To Bed Outcome: Progressing Flowsheets (Taken 07/28/2022 1359) Pt will Transfer Bed to Chair/Chair to Bed:  with modified independence  Independently Goal: Pt Will Ambulate Outcome: Progressing Flowsheets (Taken 07/28/2022 1359) Pt will Ambulate:  > 125 feet  with modified independence  with least restrictive assistive device   2:00 PM, 07/28/22 Lonell Grandchild, MPT Physical Therapist with [facility] [phone] office [phone] mobile phone

## 2022-07-28 NOTE — Progress Notes (Addendum)
PROGRESS NOTE    Janet Bailey  ZOX:[id number] DOB: Jun 04, 1946 DOA: 07/27/2022 PCP: Jettie Booze, NP   Brief Narrative:  The patient is a 76 year old elderly Caucasian female with a past medical history significant for but not limited to hypertension, depression anxiety, GERD as well as other comorbidities who presented to the ED with concerns about back pain after falling at home on Saturday, 07/25/2022.  She states that she had mechanical fall and did not think she hit her head when she fell on the back of the left side.  She had mostly pain in the left lower thoracic to upper lumbar area and denies any bleeding or bruising and no joint deformities.  She denies chest pain or palpitation or diarrhea.  She states that she has had some intractable nausea vomiting for the last couple days and had some diarrhea yesterday..  When she presented to the ED she was found to have a sodium of 124 with a baseline sodium of being normal around 138.  Chloride was 93 and back in May was 103.  LFTs within normal limits and urine sodium was 38 urine potassium was 10.  CT of the head was requested given her fall and CT of the lumbar CT abdomen pelvis showed acute compression fracture of the T12 vertebral body with approximately 20% loss of vertebral body height and minimal bony retropulsion with no significant canal stenosis.  EDP discussed with neurosurgery Dr. Sherley Bounds who recommended a T SLO brace with outpatient follow-up for the T12 fracture.  UA was done and is not suggestive of UTI.  Currently she is being admitted and treated for the following but not limited to:  Assessment and Plan:   Symptomatic Hyponatremia/Hypochloremia  -Sodium is down to 124, sodium was 138 on 03/07/22 now sodium had slowly started improving to 127 but dropped again to 125 -Chloride is 93 it was 103 on Mar 07, 2022 and now chloride improved to 98 but has dropped down to 97 -LFTs WNL -Suspect dehydration related in setting of  poor oral intake, intractable emesis on 07/26/22 and diarrhea on 07/27/22 and Aldactone use -Urine sodium 38, urine potassium 10 -Continue to hold Aldactone, hold Prozac -IV fluids as ordered with normal saline at 100 MLS per hour until electrolytes improves and when oral intake is more reliable -Sodium dropped again and if it continues to drop further we will  consider stopping fluids and likely place on fluid restriction and start salt tablets for possible SIADH -Will consult Nephrology for further Evaluation and Recc's given that Na+ improved and dropped   Metabolic Acidosis -The patient's CO2 is now 21, chloride level is now 97, and anion gap of 7 -Continue to monitor trend and continue IV fluid hydration as above -Repeat CMP in a.m.  ? Syncope in the setting of dehydration and hyponatremia -Upon further discussion with the patient is on likely that she had a syncopal episode as she remembers falling -Echo from 01/01/2022 with EF of 50 to 65% without regional wall motion abnormalities, grade 1 diastolic dysfunction noted..  -No aortic stenosis and no mitral stenosis -Monitor on telemetry -12 Lead EKG requested---showed sinus bradycardia, ??  If symptomatic bradycardia contributed to patient's syncope/fall -Continue Hold metoprolol -CT head requested--shows no acute findings however does show Remote subcortical infarcts within the left basal ganglia and left periventricular white matter -PT and OT recommending home health and follow-up   Nausea Vomiting and Diarrhea -Improving -C/w Ondansetron 4 mg po/IV q6hprn Nausea  -C/w  IVF as above -C/w Supportive Care    Mid Back Pain -CT of lumbar spine and CT abdomen and pelvis with contrast shows-Acute compression fracture of the T12 vertebral body with approximately 20% loss of vertebral body height and minimal bony retropulsion but no significant canal stenosis. -EDP discussed case with on-call neurosurgeon Dr. Sherley Bounds, he recommends TSLO  brace and outpt fu for the T12 fx -C/w Lidocaine Patch 5% 1 patch TD q12h and Oxycodone 5 mg po q6hprn -C/w Methocarbamol and hydrocodone as prescribed as needed -Physical therapy Eval done and recommending Home Health   HTN -BP was soft with systolic blood pressure 633, patient bradycardic with heart rate of 50 on admission -Continue to Hold metoprolol (due to bradycardia), hold Aldactone due to hyponatremia and dehydration and hold losartan due to soft BP -Continue To monitor blood pressures per protocol -Will add IV Hydralazine 10 mg q6hprn HBP for prn SBP >170 mmHg -Last blood pressure reading is now 105/50  Normocytic Anemia -Patient's hemoglobin/hematocrit went from 11.9/34.4 is now 11.2/32.9 -Check anemia panel in the a.m. Continue to monitor for signs and symptoms bleeding; no overt bleeding noted -Repeat CBC in the a.m.   GERD/GI Prophylaxis -C/w PPI with Pantoprazole    Depression/Anxiety -Continue to hold Prozac due to hyponatremia,  -C/w Alprazolam 0.5 mg po BIDprn Anxiety   Hypoalbuminemia -Patient's albumin and ALT 3.3 -Continue to monitor trend and repeat CMP in a.m.  Dyslipidemia -Patient is cholesterol panel done and showed a total cholesterol/HDL ratio of 5.8, cholesterol of 145, HDL 45, LDL of 71, triglycerides of 243, VLDL 49   H/o of Remote subcortical infarcts within the left basal ganglia and left periventricular white matter -Patient and daughter does not recall any clinical symptoms of stroke in the past -Patient will benefit from aspirin and statin for secondary stroke prophylaxis -Even if her lipid panel is within desired limits, patient should still take Statin for it's Pleiotropic effects (beyond cholesterol lowering benefits)    DVT prophylaxis: heparin injection 5,000 Units Start: 07/27/22 2200 SCDs Start: 07/27/22 1757 Place TED hose Start: 07/27/22 1757    Code Status: Full Code Family Communication: Discussed with the daughter at  bedside  Disposition Plan:  Level of care: Telemetry Status is: Observation The patient will require care spanning > 2 midnights and should be moved to inpatient because: Needs further evaluation by nephrology given that her sodium is still on the low side and dropped   Consultants:  We will place a nephrology consult via epic  Procedures:  As delineated as above  Antimicrobials:  Anti-infectives (From admission, onward)    None       Subjective: Seen and examined at bedside and states that her back pain is doing okay.  Sodium came up but she is feeling all right.  States that she has some nausea but no vomiting and diarrhea is improving.  Denies any lightheadedness or dizziness.  No other concerns or complaints at this time.  Objective: Vitals:   07/27/22 2100 07/27/22 2352 07/28/22 0530 07/28/22 0753  BP: (!) 131/58 (!) 103/56 (!) 126/54 (!) 115/59  Pulse: (!) 47 (!) 50 (!) 48 (!) 51  Resp: '18 16 14 18  '$ Temp: 97.7 F (36.5 C) 98.9 F (37.2 C) (!) 97.1 F (36.2 C) 97.7 F (36.5 C)  TempSrc: Oral   Oral  SpO2: 98% 95% 94% 93%  Weight: 49.4 kg     Height: '4\' 8"'$  (1.422 m)       Intake/Output Summary (  Last 24 hours) at 07/28/2022 8657 Last data filed at 07/28/2022 0900 Gross per 24 hour  Intake 360 ml  Output --  Net 360 ml   Filed Weights   07/27/22 2100  Weight: 49.4 kg   Examination: Physical Exam:  Constitutional: Thin elderly Caucasian female currently no acute distress appears calm but slightly uncomfortable Respiratory: Diminished to auscultation bilaterally, no wheezing, rales, rhonchi or crackles. Normal respiratory effort and patient is not tachypenic. No accessory muscle use.  Unlabored breathing Cardiovascular: RRR, no murmurs / rubs / gallops. S1 and S2 auscultated. No extremity edema. Abdomen: Soft, non-tender, non-distended. Bowel sounds positive.  GU: Deferred. Musculoskeletal: No clubbing / cyanosis of digits/nails. No joint deformity upper and  lower extremities.  Skin: No rashes, lesions, ulcers on limited skin evaluation. No induration; Warm and dry.  Neurologic: CN 2-12 grossly intact with no focal deficits.  Romberg sign and cerebellar reflexes not assessed.  Psychiatric: Normal judgment and insight. Alert and oriented x 3. Normal mood and appropriate affect.   Data Reviewed: I have personally reviewed following labs and imaging studies  CBC: Recent Labs  Lab 07/27/22 1114  WBC 7.7  NEUTROABS 5.6  HGB 11.9*  HCT 34.4*  MCV 92.7  PLT 846   Basic Metabolic Panel: Recent Labs  Lab 07/27/22 1114 07/27/22 2152 07/28/22 0503  NA 124* 125* 127*  K 4.9 4.2 4.3  CL 93* 93* 98  CO2 '24 24 23  '$ GLUCOSE 121* 108* 94  BUN 6* 7* 8  CREATININE 0.49 0.54 0.49  CALCIUM 8.9 8.6* 8.4*   GFR: Estimated Creatinine Clearance: 38.6 mL/min (by C-G formula based on SCr of 0.49 mg/dL). Liver Function Tests: Recent Labs  Lab 07/27/22 1114  AST 23  ALT 28  ALKPHOS 89  BILITOT 0.9  PROT 6.5  ALBUMIN 3.8   Recent Labs  Lab 07/27/22 1114  LIPASE 24   No results for input(s): "AMMONIA" in the last 168 hours. Coagulation Profile: No results for input(s): "INR", "PROTIME" in the last 168 hours. Cardiac Enzymes: No results for input(s): "CKTOTAL", "CKMB", "CKMBINDEX", "TROPONINI" in the last 168 hours. BNP (last 3 results) No results for input(s): "PROBNP" in the last 8760 hours. HbA1C: No results for input(s): "HGBA1C" in the last 72 hours. CBG: No results for input(s): "GLUCAP" in the last 168 hours. Lipid Profile: Recent Labs    07/28/22 0503  CHOL 145  HDL 25*  LDLCALC 71  TRIG 243*  CHOLHDL 5.8   Thyroid Function Tests: No results for input(s): "TSH", "T4TOTAL", "FREET4", "T3FREE", "THYROIDAB" in the last 72 hours. Anemia Panel: No results for input(s): "VITAMINB12", "FOLATE", "FERRITIN", "TIBC", "IRON", "RETICCTPCT" in the last 72 hours. Sepsis Labs: No results for input(s): "PROCALCITON", "LATICACIDVEN" in  the last 168 hours.  No results found for this or any previous visit (from the past 240 hour(s)).   Radiology Studies: CT HEAD WO CONTRAST (5MM)  Result Date: 07/27/2022 CLINICAL DATA:  Status post fall 2 days ago. EXAM: CT HEAD WITHOUT CONTRAST TECHNIQUE: Contiguous axial images were obtained from the base of the skull through the vertex without intravenous contrast. RADIATION DOSE REDUCTION: This exam was performed according to the departmental dose-optimization program which includes automated exposure control, adjustment of the mA and/or kV according to patient size and/or use of iterative reconstruction technique. COMPARISON:  None Available. FINDINGS: Brain: No evidence of acute infarction, hemorrhage, hydrocephalus, extra-axial collection or mass lesion/mass effect. Remote subcortical infarcts identified within the left basal ganglia and left periventricular white matter,  image 17/2 and image 15/2. Vascular: No hyperdense vessel or unexpected calcification. Skull: Normal. Negative for fracture or focal lesion. Sinuses/Orbits: No acute finding. Other: None. IMPRESSION: 1. No acute intracranial abnormalities. 2. Remote subcortical infarcts within the left basal ganglia and left periventricular white matter. Electronically Signed   By: Kerby Moors M.D.   On: 07/27/2022 18:46   CT L-SPINE NO CHARGE  Result Date: 07/27/2022 CLINICAL DATA:  Fall on 07/25/2022 EXAM: CT LUMBAR SPINE WITHOUT CONTRAST TECHNIQUE: Multidetector CT imaging of the lumbar spine was performed without intravenous contrast administration. Multiplanar CT image reconstructions were also generated. RADIATION DOSE REDUCTION: This exam was performed according to the departmental dose-optimization program which includes automated exposure control, adjustment of the mA and/or kV according to patient size and/or use of iterative reconstruction technique. COMPARISON:  CT abdomen/pelvis 11/16/2018 FINDINGS: Segmentation: Standard; the  lowest formed disc space is designated L5-S1. Alignment: There is mild levocurvature centered at L1-L2. There is no antero or retrolisthesis. There is no jumped or perched facet or other evidence of traumatic malalignment. Vertebrae: There is compression deformity inferior T12 endplate with approximately 20% loss of vertebral height and minimal bony retropulsion but no significant spinal canal stenosis. There is no extension posterior elements. The other vertebral body heights are preserved, without other evidence of acute fracture. There is no osseous lesion. Paraspinal and other soft tissues: The paraspinal soft tissues are unremarkable. The abdominal and pelvic viscera are assessed on the separately dictated CT abdomen pelvis. Disc levels: There is multilevel disc space narrowing throughout the imaged lower thoracic spine and most advanced lumbar spine at L1-L2 and L2-L3 with multilevel vacuum disc phenomenon. There is overall mild multilevel facet arthropathy. There are mild disc bulges in the lumbar spine without evidence of significant spinal canal stenosis there is up to mild-to-moderate left neural foraminal at L4-L5. IMPRESSION: 1. Acute compression fracture of the T12 vertebral body with approximately 20% loss of vertebral body height and minimal bony retropulsion but no significant canal stenosis. 2. Overall mild multilevel degenerative changes as detailed above. Electronically Signed   By: Valetta Mole M.D.   On: 07/27/2022 14:26   CT ABDOMEN PELVIS W CONTRAST  Result Date: 07/27/2022 CLINICAL DATA:  Fall on 07/25/2022, low back pain. EXAM: CT ABDOMEN AND PELVIS WITH CONTRAST TECHNIQUE: Multidetector CT imaging of the abdomen and pelvis was performed using the standard protocol following bolus administration of intravenous contrast. RADIATION DOSE REDUCTION: This exam was performed according to the departmental dose-optimization program which includes automated exposure control, adjustment of the mA  and/or kV according to patient size and/or use of iterative reconstruction technique. CONTRAST:  55m OMNIPAQUE IOHEXOL 300 MG/ML  SOLN COMPARISON:  CT abdomen/pelvis 11/16/2018 FINDINGS: Lower chest: The imaged lung bases are clear. Mild aortic valve and coronary artery calcifications are noted the imaged heart is otherwise unremarkable. Hepatobiliary: The liver is unremarkable. The gallbladder is surgically absent, accounting for the mild intra and extrahepatic biliary ductal dilatation. Pancreas: Unremarkable. Spleen: Unremarkable. Adrenals/Urinary Tract: The adrenals are unremarkable. The kidneys are unremarkable, with no focal lesion, stone, hydronephrosis, or hydroureter. The kidneys enhance symmetrically. There is symmetric excretion of contrast into the collecting systems on the delayed images. The bladder is unremarkable. Stomach/Bowel: There is moderate-sized hiatal hernia. There is no evidence of bowel obstruction. There is no abnormal bowel wall thickening or inflammatory change. The colon is redundant with cecum located in the left lower quadrant, similar to the prior study. Vascular/Lymphatic: There is scattered calcified atherosclerotic plaque in the  nonaneurysmal abdominal aorta. The major branch vessels are patent. The main portal and splenic veins are patent. There is no abdominopelvic lymphadenopathy. Reproductive: The uterus is surgically absent. There is a 1.3 cm cyst in the right adnexa for which no specific imaging follow-up is required. There is no left adnexal mass. Other: There is no ascites or free air. Musculoskeletal: There is a remote fracture of the right inferior pubic ramus which is new since 2020. There is an acute compression fracture of the T12 vertebral body with approximately 20% loss of vertebral body height and minimal bony retropulsion without significant spinal canal stenosis. There is no other acute osseous abnormality. The lumbar spine is assessed in full on the separately  dictated lumbar spine CT. IMPRESSION: 1. Acute compression fracture of the T12 vertebral body with approximately 20% loss of vertebral body height and minimal bony retropulsion but no significant canal stenosis. 2. No other acute findings in the abdomen or pelvis. 3. Moderate-sized hiatal hernia. 4. Remote fracture of the right inferior pubic ramus is new since 2020. Electronically Signed   By: Valetta Mole M.D.   On: 07/27/2022 13:53    Scheduled Meds:  calcium-vitamin D  1 tablet Oral Daily   heparin  5,000 Units Subcutaneous Q8H   lidocaine  1 patch Transdermal Q24H   lidocaine  1 patch Transdermal Q24H   methocarbamol  500 mg Oral TID   pantoprazole  40 mg Oral Daily   sodium chloride flush  3 mL Intravenous Q12H   sodium chloride flush  3 mL Intravenous Q12H   Continuous Infusions:  sodium chloride     sodium chloride 100 mL/hr at 07/28/22 7048    LOS: 1 day   Raiford Noble, DO [facility] Available via Epic secure chat 7am-7pm After these hours, please refer to coverage provider listed on [email or web address] 07/28/2022, 9:24 AM

## 2022-07-28 NOTE — Evaluation (Signed)
Occupational Therapy Evaluation Patient Details Name: Janet Bailey MRN: [id number] DOB: [birth year] Today's Date: 07/28/2022   History of Present Illness Janet Bailey  is a 76 y.o. female with past medical history relevant for hypertension, depression/anxiety, and GERD presents to the ED with concerns about back pain after falling at home on Saturday, 07/25/2022    -CT head requested  --CT of lumbar spine and CT abdomen and pelvis with contrast shows-Acute compression fracture of the T12 vertebral body with approximately 20% loss of vertebral body height and minimal bony retropulsion but no significant canal stenosis. (per MD)   Clinical Impression   Pt agreeable to OT and PT co-evaluation. Pt is independent at baseline. Today the PT assisted pt in donning TLSO brace. Pt required minimal single hand held assist to pull to supine. Mod I assist for sit to supine.  Pt demonstrates labored effort for donning socks at EOB but is able to complete. Mild deficits in balance during ambulation with independence in the hall. Pt demonstrates generally weak B UE. Pt seemed to have little interest in home health services. Pt is not recommended for further acute OT services and will be discharged to care of nursing staff for remaining length of stay.      Recommendations for follow up therapy are one component of a multi-disciplinary discharge planning process, led by the attending physician.  Recommendations may be updated based on patient status, additional functional criteria and insurance authorization.   Follow Up Recommendations  Home health OT (For safety assessment due to fall if pt is interested.)    Assistance Recommended at Discharge PRN  Patient can return home with the following      Functional Status Assessment  Patient has had a recent decline in their functional status and demonstrates the ability to make significant improvements in function in a reasonable and predictable amount of time.   Equipment Recommendations  None recommended by OT    Recommendations for Other Services       Precautions / Restrictions Precautions Precautions: Fall Precaution Comments: TLSO brace Required Braces or Orthoses: Spinal Brace Spinal Brace: Thoracolumbosacral orthotic Restrictions Weight Bearing Restrictions: No      Mobility Bed Mobility Overal bed mobility: Needs Assistance Bed Mobility: Supine to Sit     Supine to sit: Supervision, Min assist Sit to supine; modified independent      General bed mobility comments: Mild assist to pull to sit from PT    Transfers Overall transfer level: Modified independent                 General transfer comment: Mild labored movement.      Balance Overall balance assessment: Mild deficits observed, not formally tested                                         ADL either performed or assessed with clinical judgement   ADL Overall ADL's : Needs assistance/impaired     Grooming: Modified independent;Standing               Lower Body Dressing: Modified independent;Sitting/lateral leans Lower Body Dressing Details (indicate cue type and reason): donned socks at EOB Toilet Transfer: Independent           Functional mobility during ADLs: Independent       Vision Baseline Vision/History: 1 Wears glasses Ability to See in Adequate Light: 0 Adequate Patient  Visual Report: No change from baseline Vision Assessment?: No apparent visual deficits                Pertinent Vitals/Pain Pain Assessment Pain Assessment: 0-10 Pain Score: 6  Pain Location: back Pain Descriptors / Indicators: Other (Comment) ("hurts") Pain Intervention(s): Monitored during session, Limited activity within patient's tolerance, Repositioned     Hand Dominance Left   Extremity/Trunk Assessment Upper Extremity Assessment Upper Extremity Assessment: Generalized weakness   Lower Extremity Assessment Lower  Extremity Assessment: Defer to PT evaluation   Cervical / Trunk Assessment Cervical / Trunk Assessment: Normal   Communication Communication Communication: HOH   Cognition Arousal/Alertness: Awake/alert Behavior During Therapy: WFL for tasks assessed/performed Overall Cognitive Status: Within Functional Limits for tasks assessed                                                        Home Living Family/patient expects to be discharged to:: Private residence Living Arrangements: Alone Available Help at Discharge: Family;Available PRN/intermittently Type of Home: House Home Access: Stairs to enter [employer] of Steps: 3 Entrance Stairs-Rails: Can reach both Home Layout: One level     Bathroom Shower/Tub: Occupational psychologist: Standard     Home Equipment: Conservation officer, nature (2 wheels)          Prior Functioning/Environment Prior Level of Function : Independent/Modified Independent             Mobility Comments: Hydrographic surveyor without AD ADLs Comments: Indpendent                                Co-evaluation PT/OT/SLP Co-Evaluation/Treatment: Yes Reason for Co-Treatment: To address functional/ADL transfers   OT goals addressed during session: ADL's and self-care      AM-PAC OT "6 Clicks" Daily Activity     Outcome Measure Help from another person eating meals?: None Help from another person taking care of personal grooming?: None Help from another person toileting, which includes using toliet, bedpan, or urinal?: None Help from another person bathing (including washing, rinsing, drying)?: None Help from another person to put on and taking off regular upper body clothing?: None Help from another person to put on and taking off regular lower body clothing?: None 6 Click Score: 24   End of Session Equipment Utilized During Treatment: Other (comment) (TLSO)  Activity Tolerance: Patient tolerated  treatment well Patient left: in bed;with call bell/phone within reach  OT Visit Diagnosis: Unsteadiness on feet (R26.81);History of falling (Z91.81)                Time: 7591-6384 OT Time Calculation (min): 19 min Charges:  OT General Charges $OT Visit: 1 Visit OT Evaluation $OT Eval Low Complexity: 1 Low  Janet Bailey OT, MOT  Janet Bailey 07/28/2022, 9:53 AM

## 2022-07-29 DIAGNOSIS — E871 Hypo-osmolality and hyponatremia: Secondary | ICD-10-CM | POA: Diagnosis not present

## 2022-07-29 LAB — COMPREHENSIVE METABOLIC PANEL
ALT: 17 U/L (ref 0–44)
AST: 13 U/L — ABNORMAL LOW (ref 15–41)
Albumin: 3 g/dL — ABNORMAL LOW (ref 3.5–5.0)
Alkaline Phosphatase: 73 U/L (ref 38–126)
Anion gap: 5 (ref 5–15)
BUN: 9 mg/dL (ref 8–23)
CO2: 24 mmol/L (ref 22–32)
Calcium: 8.3 mg/dL — ABNORMAL LOW (ref 8.9–10.3)
Chloride: 103 mmol/L (ref 98–111)
Creatinine, Ser: 0.46 mg/dL (ref 0.44–1.00)
GFR, Estimated: >60 mL/min (ref >60)
Glucose, Bld: 127 mg/dL — ABNORMAL HIGH (ref 70–99)
Potassium: 4 mmol/L (ref 3.5–5.1)
Sodium: 132 mmol/L — ABNORMAL LOW (ref 135–145)
Total Bilirubin: 0.6 mg/dL (ref 0.3–1.2)
Total Protein: 5.3 g/dL — ABNORMAL LOW (ref 6.5–8.1)

## 2022-07-29 LAB — CBC WITH DIFFERENTIAL/PLATELET
Abs Immature Granulocytes: 0.01 10*3/uL (ref 0.00–0.07)
Basophils Absolute: 0 10*3/uL (ref 0.0–0.1)
Basophils Relative: 1 %
Eosinophils Absolute: 0.1 10*3/uL (ref 0.0–0.5)
Eosinophils Relative: 2 %
HCT: 32.8 % — ABNORMAL LOW (ref 36.0–46.0)
Hemoglobin: 10.8 g/dL — ABNORMAL LOW (ref 12.0–15.0)
Immature Granulocytes: 0 %
Lymphocytes Relative: 30 %
Lymphs Abs: 1.1 10*3/uL (ref 0.7–4.0)
MCH: 31.9 pg (ref 26.0–34.0)
MCHC: 32.9 g/dL (ref 30.0–36.0)
MCV: 96.8 fL (ref 80.0–100.0)
Monocytes Absolute: 0.3 10*3/uL (ref 0.1–1.0)
Monocytes Relative: 9 %
Neutro Abs: 2.1 10*3/uL (ref 1.7–7.7)
Neutrophils Relative %: 58 %
Platelets: 217 10*3/uL (ref 150–400)
RBC: 3.39 MIL/uL — ABNORMAL LOW (ref 3.87–5.11)
RDW: 12.2 % (ref 11.5–15.5)
WBC: 3.6 10*3/uL — ABNORMAL LOW (ref 4.0–10.5)
nRBC: 0 % (ref 0.0–0.2)

## 2022-07-29 LAB — MAGNESIUM: Magnesium: 1.9 mg/dL (ref 1.7–2.4)

## 2022-07-29 LAB — PHOSPHORUS: Phosphorus: 2.6 mg/dL (ref 2.5–4.6)

## 2022-07-29 MED ORDER — LOSARTAN POTASSIUM 50 MG PO TABS: 25.0000 mg | ORAL_TABLET | Freq: Every day | ORAL | 3 refills | Status: DC

## 2022-07-29 MED ORDER — OXYCODONE HCL 5 MG PO TABS: 5.0000 mg | ORAL_TABLET | Freq: Four times a day (QID) | ORAL | 0 refills | Status: DC | PRN

## 2022-07-29 MED ORDER — LIDOCAINE 5 % EX PTCH: 1.0000 | MEDICATED_PATCH | CUTANEOUS | 0 refills | Status: DC

## 2022-07-29 MED ORDER — ALUM & MAG HYDROXIDE-SIMETH 200-200-20 MG/5ML PO SUSP: 15.0000 mL | Freq: Four times a day (QID) | ORAL | 0 refills | Status: AC | PRN

## 2022-07-29 MED ORDER — PANTOPRAZOLE SODIUM 40 MG PO TBEC: 40.0000 mg | DELAYED_RELEASE_TABLET | Freq: Two times a day (BID) | ORAL | 0 refills | Status: DC

## 2022-07-29 MED ORDER — METHOCARBAMOL 500 MG PO TABS: 500.0000 mg | ORAL_TABLET | Freq: Three times a day (TID) | ORAL | 0 refills | Status: DC

## 2022-07-29 NOTE — Discharge Summary (Signed)
Physician Discharge Summary  Janet Bailey GYF:[id number] DOB: 07-19-1946 DOA: 07/27/2022  PCP: Jettie Booze, NP  Admit date: 07/27/2022 Discharge date: 07/29/2022  Admitted From: Home Disposition:  Home  Discharge Condition:Stable CODE STATUS:FULL Diet recommendation: Heart Healthy   Brief/Interim Summary:  The patient is a 76 year old elderly Caucasian female with a past medical history significant for but not limited to hypertension, depression anxiety, GERD as well as other comorbidities who presented to the ED with concerns about back pain after falling at home on Saturday, 07/25/2022. She had mechanical fall and did not think she hit her head when she fell on the back of the left side.  She had mostly pain in the left lower thoracic to upper lumbar area and denies any bleeding or bruising and no joint deformities.  She denies chest pain or palpitation or diarrhea. Shes had some intractable nausea vomiting for the last couple days and had some diarrhea .  CT imaging showed acute compression fracture of T12 vertebral body with 20% loss of height, minimal bony retropulsion.  Case discussed with neurosurgery, recommended TLSO brace and follow-up as an outpatient.  Sodium level was low on presentation, now stable at 130.  PT/OT recommending home health on discharge.  Pain is well controlled at the moment.  She is medically stable for discharge home today.  Following problems were addressed during her hospitalization:  Symptomatic Hyponatremia/Hypochloremia  -Sodium is 124 on presentation, now 130.  Patient is asymptomatic.  Prozac, spironolactone discontinued.  Check BMP in a week    Syncope in the setting of dehydration and hyponatremia -Upon further discussion with the patient is on likely that she had a syncopal episode as she remembers falling -Echo from 01/01/2022 with EF of 50 to 65% without regional wall motion abnormalities, grade 1 diastolic dysfunction noted..  -No aortic  stenosis and no mitral stenosis --PT and OT recommending home health and follow-up    Nausea Vomiting and Diarrhea -Resolved.  IV fluids discontinued   Mid Back Pain -CT of lumbar spine and CT abdomen and pelvis with contrast shows-Acute compression fracture of the T12 vertebral body with approximately 20% loss of vertebral body height and minimal bony retropulsion but no significant canal stenosis. -EDP discussed case with on-call neurosurgeon Dr. Sherley Bounds, he recommends TSLO brace and outpt fu for the T12 fx -C/w Lidocaine Patch 5% 1 patch TD q12h and Oxycodone 5 mg po q6hprn -C/w Methocarbamol    HTN -BP was soft with systolic blood pressure 916, patient bradycardic with heart rate of 50 on admission -Antihypertensives discontinued   Normocytic Anemia -Hb stable   GERD/GI Prophylaxis -C/w PPI with Pantoprazole    Depression/Anxiety -Continue to hold Prozac due to hyponatremia,  -C/w Alprazolam 0.5 mg po BIDprn Anxiety         Discharge Diagnoses:  Principal Problem:   Hyponatremia Active Problems:   Closed T12 fracture (HCC)   Depression/Anxiety   Essential hypertension, benign    Discharge Instructions  Discharge Instructions     Diet - low sodium heart healthy   Complete by: As directed    Discharge instructions   Complete by: As directed    1)Please take prescribed medications as instructed 2)We have stopped metoprolol,losartan .  Monitor blood pressure at home.  Your blood pressure is normal even without medications.  Do a BMP test during the follow-up to check your sodium level. 3)Follow up with neurosurgery as an outpatient.  Name and number the provider has been attached 4)Wear TLSO  brace when out of bed 5)Follow up with home health   Increase activity slowly   Complete by: As directed       Allergies as of 07/29/2022   No Known Allergies      Medication List     STOP taking these medications    FLUoxetine 10 MG tablet Commonly known as:  PROZAC   losartan 50 MG tablet Commonly known as: COZAAR   metoprolol succinate 25 MG 24 hr tablet Commonly known as: TOPROL-XL   omeprazole 40 MG capsule Commonly known as: PRILOSEC   spironolactone 25 MG tablet Commonly known as: Aldactone       TAKE these medications    alum & mag hydroxide-simeth 096-283-66 MG/5ML suspension Commonly known as: MAALOX/MYLANTA Take 15 mLs by mouth every 6 (six) hours as needed for indigestion or heartburn.   B-12 PO Take 1 tablet by mouth daily.   Calcium Carbonate-Vitamin D 600-200 MG-UNIT Tabs Take by mouth daily.   cholecalciferol 1000 units tablet Commonly known as: VITAMIN D Take 1,000 Units by mouth 2 (two) times daily.   clonazePAM 0.5 MG tablet Commonly known as: KLONOPIN Take 0.5 mg by mouth 2 (two) times daily as needed for anxiety.   lidocaine 5 % Commonly known as: LIDODERM Place 1 patch onto the skin daily. Remove & Discard patch within 12 hours or as directed by MD   methocarbamol 500 MG tablet Commonly known as: ROBAXIN Take 1 tablet (500 mg total) by mouth 3 (three) times daily.   oxyCODONE 5 MG immediate release tablet Commonly known as: Oxy IR/ROXICODONE Take 1 tablet (5 mg total) by mouth every 6 (six) hours as needed for moderate pain.   pantoprazole 40 MG tablet Commonly known as: PROTONIX Take 1 tablet (40 mg total) by mouth 2 (two) times daily.        Follow-up Information     Eustace Moore, MD. Schedule an appointment as soon as possible for a visit in 1 week(s).   Specialty: Neurosurgery Contact information: [address] [place] [address] [phone]         Jettie Booze, NP. Schedule an appointment as soon as possible for a visit in 1 week(s).   Specialty: Family Medicine Contact information: [place] [address] [place] [address] [phone]         Geralynn Rile, MD .   Specialties: Cardiology, Internal Medicine, Radiology Contact  information: [address] [place] [address] [phone]                No Known Allergies  Consultations: None   Procedures/Studies: CT HEAD WO CONTRAST (5MM)  Result Date: 07/27/2022 CLINICAL DATA:  Status post fall 2 days ago. EXAM: CT HEAD WITHOUT CONTRAST TECHNIQUE: Contiguous axial images were obtained from the base of the skull through the vertex without intravenous contrast. RADIATION DOSE REDUCTION: This exam was performed according to the departmental dose-optimization program which includes automated exposure control, adjustment of the mA and/or kV according to patient size and/or use of iterative reconstruction technique. COMPARISON:  None Available. FINDINGS: Brain: No evidence of acute infarction, hemorrhage, hydrocephalus, extra-axial collection or mass lesion/mass effect. Remote subcortical infarcts identified within the left basal ganglia and left periventricular white matter, image 17/2 and image 15/2. Vascular: No hyperdense vessel or unexpected calcification. Skull: Normal. Negative for fracture or focal lesion. Sinuses/Orbits: No acute finding. Other: None. IMPRESSION: 1. No acute intracranial abnormalities. 2. Remote subcortical infarcts within the left  basal ganglia and left periventricular white matter. Electronically Signed   By: Kerby Moors M.D.   On: 07/27/2022 18:46   CT L-SPINE NO CHARGE  Result Date: 07/27/2022 CLINICAL DATA:  Fall on 07/25/2022 EXAM: CT LUMBAR SPINE WITHOUT CONTRAST TECHNIQUE: Multidetector CT imaging of the lumbar spine was performed without intravenous contrast administration. Multiplanar CT image reconstructions were also generated. RADIATION DOSE REDUCTION: This exam was performed according to the departmental dose-optimization program which includes automated exposure control, adjustment of the mA and/or kV according to patient size and/or use of iterative reconstruction technique. COMPARISON:  CT abdomen/pelvis 11/16/2018  FINDINGS: Segmentation: Standard; the lowest formed disc space is designated L5-S1. Alignment: There is mild levocurvature centered at L1-L2. There is no antero or retrolisthesis. There is no jumped or perched facet or other evidence of traumatic malalignment. Vertebrae: There is compression deformity inferior T12 endplate with approximately 20% loss of vertebral height and minimal bony retropulsion but no significant spinal canal stenosis. There is no extension posterior elements. The other vertebral body heights are preserved, without other evidence of acute fracture. There is no osseous lesion. Paraspinal and other soft tissues: The paraspinal soft tissues are unremarkable. The abdominal and pelvic viscera are assessed on the separately dictated CT abdomen pelvis. Disc levels: There is multilevel disc space narrowing throughout the imaged lower thoracic spine and most advanced lumbar spine at L1-L2 and L2-L3 with multilevel vacuum disc phenomenon. There is overall mild multilevel facet arthropathy. There are mild disc bulges in the lumbar spine without evidence of significant spinal canal stenosis there is up to mild-to-moderate left neural foraminal at L4-L5. IMPRESSION: 1. Acute compression fracture of the T12 vertebral body with approximately 20% loss of vertebral body height and minimal bony retropulsion but no significant canal stenosis. 2. Overall mild multilevel degenerative changes as detailed above. Electronically Signed   By: Valetta Mole M.D.   On: 07/27/2022 14:26   CT ABDOMEN PELVIS W CONTRAST  Result Date: 07/27/2022 CLINICAL DATA:  Fall on 07/25/2022, low back pain. EXAM: CT ABDOMEN AND PELVIS WITH CONTRAST TECHNIQUE: Multidetector CT imaging of the abdomen and pelvis was performed using the standard protocol following bolus administration of intravenous contrast. RADIATION DOSE REDUCTION: This exam was performed according to the departmental dose-optimization program which includes automated  exposure control, adjustment of the mA and/or kV according to patient size and/or use of iterative reconstruction technique. CONTRAST:  35m OMNIPAQUE IOHEXOL 300 MG/ML  SOLN COMPARISON:  CT abdomen/pelvis 11/16/2018 FINDINGS: Lower chest: The imaged lung bases are clear. Mild aortic valve and coronary artery calcifications are noted the imaged heart is otherwise unremarkable. Hepatobiliary: The liver is unremarkable. The gallbladder is surgically absent, accounting for the mild intra and extrahepatic biliary ductal dilatation. Pancreas: Unremarkable. Spleen: Unremarkable. Adrenals/Urinary Tract: The adrenals are unremarkable. The kidneys are unremarkable, with no focal lesion, stone, hydronephrosis, or hydroureter. The kidneys enhance symmetrically. There is symmetric excretion of contrast into the collecting systems on the delayed images. The bladder is unremarkable. Stomach/Bowel: There is moderate-sized hiatal hernia. There is no evidence of bowel obstruction. There is no abnormal bowel wall thickening or inflammatory change. The colon is redundant with cecum located in the left lower quadrant, similar to the prior study. Vascular/Lymphatic: There is scattered calcified atherosclerotic plaque in the nonaneurysmal abdominal aorta. The major branch vessels are patent. The main portal and splenic veins are patent. There is no abdominopelvic lymphadenopathy. Reproductive: The uterus is surgically absent. There is a 1.3 cm cyst in the right adnexa for  which no specific imaging follow-up is required. There is no left adnexal mass. Other: There is no ascites or free air. Musculoskeletal: There is a remote fracture of the right inferior pubic ramus which is new since 2020. There is an acute compression fracture of the T12 vertebral body with approximately 20% loss of vertebral body height and minimal bony retropulsion without significant spinal canal stenosis. There is no other acute osseous abnormality. The lumbar  spine is assessed in full on the separately dictated lumbar spine CT. IMPRESSION: 1. Acute compression fracture of the T12 vertebral body with approximately 20% loss of vertebral body height and minimal bony retropulsion but no significant canal stenosis. 2. No other acute findings in the abdomen or pelvis. 3. Moderate-sized hiatal hernia. 4. Remote fracture of the right inferior pubic ramus is new since 2020. Electronically Signed   By: Valetta Mole M.D.   On: 07/27/2022 13:53      Subjective:  Patient seen and examined the bedside today.  Hemodynamically stable for discharge.  Discussed discharge planning with the daughter on phone  Discharge Exam: Vitals:   07/28/22 2002 07/29/22 0524  BP: (!) 127/50 134/60  Pulse: (!) 59 (!) 52  Resp: 14 19  Temp: 98 F (36.7 C) 97.6 F (36.4 C)  SpO2: 97% 95%   Vitals:   07/28/22 0753 07/28/22 1309 07/28/22 2002 07/29/22 0524  BP: (!) 115/59 (!) 105/50 (!) 127/50 134/60  Pulse: (!) 51 (!) 59 (!) 59 (!) 52  Resp: '18 18 14 19  '$ Temp: 97.7 F (36.5 C) 98 F (36.7 C) 98 F (36.7 C) 97.6 F (36.4 C)  TempSrc: Oral Oral Oral Oral  SpO2: 93% 97% 97% 95%  Weight:      Height:        General: Pt is alert, awake, not in acute distress Cardiovascular: RRR, S1/S2 +, no rubs, no gallops Respiratory: CTA bilaterally, no wheezing, no rhonchi Abdominal: Soft, NT, ND, bowel sounds + Extremities: no edema, no cyanosis    The results of significant diagnostics from this hospitalization (including imaging, microbiology, ancillary and laboratory) are listed below for reference.     Microbiology: No results found for this or any previous visit (from the past 240 hour(s)).   Labs: BNP (last 3 results) No results for input(s): "BNP" in the last 8760 hours. Basic Metabolic Panel: Recent Labs  Lab 07/27/22 2152 07/28/22 0503 07/28/22 1046 07/28/22 1651 07/29/22 0857  NA 125* 127* 125* 130* 132*  K 4.2 4.3 4.5 4.5 4.0  CL 93* 98 97* 100 103   CO2 24 23 21* 24 24  GLUCOSE 108* 94 122* 97 127*  BUN 7* 8 7* 8 9  CREATININE 0.54 0.49 0.45 0.63 0.46  CALCIUM 8.6* 8.4* 8.4* 8.2* 8.3*  MG  --   --  1.8  --  1.9  PHOS  --   --  3.5  --  2.6   Liver Function Tests: Recent Labs  Lab 07/27/22 1114 07/28/22 1046 07/29/22 0857  AST 23 12* 13*  ALT '28 20 17  '$ ALKPHOS 89 79 73  BILITOT 0.9 0.9 0.6  PROT 6.5 5.9* 5.3*  ALBUMIN 3.8 3.3* 3.0*   Recent Labs  Lab 07/27/22 1114  LIPASE 24   No results for input(s): "AMMONIA" in the last 168 hours. CBC: Recent Labs  Lab 07/27/22 1114 07/28/22 1046 07/29/22 0857  WBC 7.7 4.5 3.6*  NEUTROABS 5.6 2.7 2.1  HGB 11.9* 11.2* 10.8*  HCT 34.4* 32.9* 32.8*  MCV 92.7 94.8 96.8  PLT 260 217 217   Cardiac Enzymes: No results for input(s): "CKTOTAL", "CKMB", "CKMBINDEX", "TROPONINI" in the last 168 hours. BNP: Invalid input(s): "POCBNP" CBG: No results for input(s): "GLUCAP" in the last 168 hours. D-Dimer No results for input(s): "DDIMER" in the last 72 hours. Hgb A1c No results for input(s): "HGBA1C" in the last 72 hours. Lipid Profile Recent Labs    07/28/22 0503  CHOL 145  HDL 25*  LDLCALC 71  TRIG 243*  CHOLHDL 5.8   Thyroid function studies No results for input(s): "TSH", "T4TOTAL", "T3FREE", "THYROIDAB" in the last 72 hours.  Invalid input(s): "FREET3" Anemia work up No results for input(s): "VITAMINB12", "FOLATE", "FERRITIN", "TIBC", "IRON", "RETICCTPCT" in the last 72 hours. Urinalysis    Component Value Date/Time   COLORURINE COLORLESS (A) 07/27/2022 1114   APPEARANCEUR CLEAR 07/27/2022 1114   LABSPEC 1.003 (L) 07/27/2022 1114   PHURINE 7.0 07/27/2022 1114   GLUCOSEU NEGATIVE 07/27/2022 1114   HGBUR NEGATIVE 07/27/2022 1114   BILIRUBINUR NEGATIVE 07/27/2022 1114   KETONESUR NEGATIVE 07/27/2022 1114   PROTEINUR NEGATIVE 07/27/2022 1114   NITRITE NEGATIVE 07/27/2022 1114   LEUKOCYTESUR NEGATIVE 07/27/2022 1114   Sepsis Labs Recent Labs  Lab  07/27/22 1114 07/28/22 1046 07/29/22 0857  WBC 7.7 4.5 3.6*   Microbiology No results found for this or any previous visit (from the past 240 hour(s)).  Please note: You were cared for by a hospitalist during your hospital stay. Once you are discharged, your primary care physician will handle any further medical issues. Please note that NO REFILLS for any discharge medications will be authorized once you are discharged, as it is imperative that you return to your primary care physician (or establish a relationship with a primary care physician if you do not have one) for your post hospital discharge needs so that they can reassess your need for medications and monitor your lab values.    Time coordinating discharge: 40 minutes  SIGNED:   Shelly Coss, MD  [facility] 07/29/2022, 10:19 AM Pager [phone]  If 7PM-7AM, please contact night-coverage [email or web address] Password [email or web address]

## 2022-07-29 NOTE — Progress Notes (Signed)
Patient given discharge instructions and all belongings, patient verbally expressed understanding of home instructions. Patient instable condition at time of discharge.

## 2022-08-04 DIAGNOSIS — S22080A Wedge compression fracture of T11-T12 vertebra, initial encounter for closed fracture: Secondary | ICD-10-CM | POA: Diagnosis not present

## 2022-08-04 DIAGNOSIS — Z09 Encounter for follow-up examination after completed treatment for conditions other than malignant neoplasm: Secondary | ICD-10-CM | POA: Diagnosis not present

## 2022-08-04 DIAGNOSIS — I10 Essential (primary) hypertension: Secondary | ICD-10-CM | POA: Diagnosis not present

## 2022-08-04 DIAGNOSIS — E871 Hypo-osmolality and hyponatremia: Secondary | ICD-10-CM | POA: Diagnosis not present

## 2022-08-04 DIAGNOSIS — K219 Gastro-esophageal reflux disease without esophagitis: Secondary | ICD-10-CM | POA: Diagnosis not present

## 2022-08-17 ENCOUNTER — Other Ambulatory Visit: Payer: Self-pay | Admitting: Cardiovascular Disease

## 2022-08-31 MED ORDER — HYDRALAZINE HCL 50 MG PO TABS: 50.0000 mg | ORAL_TABLET | Freq: Three times a day (TID) | ORAL | 3 refills | Status: DC

## 2022-08-31 NOTE — Addendum Note (Signed)
Addended by: Caprice Beaver T on: 08/31/2022 03:17 PM   Modules accepted: Orders

## 2022-09-01 DIAGNOSIS — S22080A Wedge compression fracture of T11-T12 vertebra, initial encounter for closed fracture: Secondary | ICD-10-CM | POA: Diagnosis not present

## 2022-09-02 NOTE — Progress Notes (Signed)
Cardiology Clinic Note   Patient Name: Janet Bailey Date of Encounter: 09/04/2022  Primary Care Provider:  Jettie Booze, NP Primary Cardiologist:  Evalina Field, MD  Patient Profile    76 year old female with hx of hypertension, palpitations, and PVC's. Other history includes depression,anxiety and recent mechanical fall leading to admission in 07/2022 causing an acute compression fracture of T12 vertebral body.  She was to undergo PT/OT as outpatient. She was found to have hyponatremia with Na 124. Prior to discharge 130, with discontinuation of Prozac and spironolactone.   Past Medical History    Past Medical History:  Diagnosis Date   Colon polyps    Depression    GERD (gastroesophageal reflux disease)    Hiatal hernia    Hypertension    Past Surgical History:  Procedure Laterality Date   ABDOMINAL HYSTERECTOMY     cataract surgery     CHOLECYSTECTOMY     INGUINAL HERNIA REPAIR     tension free transvaginal tape procedure      Allergies  No Known Allergies  History of Present Illness    Mrs. Stonesifer returns today status post hospitalization after undergoing admission and treatment for T12 fracture in the setting of a mechanical fall.  Her daughter is an Therapist, sports, who is a Therapist, art at [employer], accompanies her to the visit.  The patient continues to have complaints of rapid heart rate and palpitations which she states mostly occurs at nighttime.  Metoprolol and spironolactone were discontinued during hospitalization due to hyponatremia and bradycardia.  She was ultimately started on losartan 50 mg daily, but blood pressure remained elevated and therefore was increased to 100 mg daily.  She is tolerating this medication but her daughter is not seeing a significant stabilization of her blood pressure at home.  Her daughter also believes that this is possibly related to her anxiety.  Hydralazine 25 mg 3 times daily was ordered due to elevated blood pressure  based upon phone call from her daughter to our office.  The patient has not started taking it yet.  She comes to the office today hypertensive and mildly anxious.  Home Medications    Current Outpatient Medications  Medication Sig Dispense Refill   alum & mag hydroxide-simeth (MAALOX/MYLANTA) 200-200-20 MG/5ML suspension Take 15 mLs by mouth every 6 (six) hours as needed for indigestion or heartburn. 355 mL 0   Calcium Carbonate-Vitamin D 600-200 MG-UNIT TABS Take by mouth daily.     cholecalciferol (VITAMIN D) 1000 UNITS tablet Take 1,000 Units by mouth 2 (two) times daily.     clonazePAM (KLONOPIN) 0.5 MG tablet Take 0.5 mg by mouth 2 (two) times daily as needed for anxiety.     Cyanocobalamin (B-12 PO) Take 1 tablet by mouth daily.     lidocaine (LIDODERM) 5 % Place 1 patch onto the skin daily. Remove & Discard patch within 12 hours or as directed by MD 30 patch 0   losartan (COZAAR) 50 MG tablet Take by mouth.     methocarbamol (ROBAXIN) 500 MG tablet Take 1 tablet (500 mg total) by mouth 3 (three) times daily. 30 tablet 0   metoprolol succinate (TOPROL-XL) 25 MG 24 hr tablet TAKE 1 TABLET BY MOUTH DAILY 100 tablet 2   oxyCODONE (OXY IR/ROXICODONE) 5 MG immediate release tablet Take 1 tablet (5 mg total) by mouth every 6 (six) hours as needed for moderate pain. 15 tablet 0   pantoprazole (PROTONIX) 40 MG tablet Take 1 tablet (  40 mg total) by mouth 2 (two) times daily. 60 tablet 0   hydrALAZINE (APRESOLINE) 50 MG tablet Take 1 tablet (50 mg total) by mouth 3 (three) times daily. (Patient not taking: Reported on 09/04/2022) 270 tablet 3   No current facility-administered medications for this visit.     Family History    Family History  Problem Relation Age of Onset   Aneurysm Mother    Heart disease Father    Arrhythmia Sister    Heart disease Sister    Colon cancer Paternal Uncle    Diabetes Daughter        One has diabetes age 53. Other in good health   She indicated that her  mother is deceased. She indicated that her father is deceased. She indicated that her sister is alive. She indicated that her brother is alive. She indicated that her daughter is alive. She indicated that her paternal uncle is deceased.  Social History    Social History   Socioeconomic History   Marital status: Widowed    Spouse name: Not on file   Number of children: 2   Years of education: Not on file   Highest education level: Not on file  Occupational History   Occupation: retired  Tobacco Use   Smoking status: Never   Smokeless tobacco: Never  Vaping Use   Vaping Use: Never used  Substance and Sexual Activity   Alcohol use: No   Drug use: No   Sexual activity: Not Currently    Comment: 1st intercourse 25 yo-1 partner  Other Topics Concern   Not on file  Social History Narrative   ** Merged History Encounter **       Social Determinants of Health   Financial Resource Strain: Not on file  Food Insecurity: No Food Insecurity (07/27/2022)   Hunger Vital Sign    Worried About Running Out of Food in the Last Year: Never true    Ran Out of Food in the Last Year: Never true  Transportation Needs: No Transportation Needs (07/27/2022)   PRAPARE - Hydrologist (Medical): No    Lack of Transportation (Non-Medical): No  Physical Activity: Not on file  Stress: Not on file  Social Connections: Not on file  Intimate Partner Violence: Not At Risk (07/27/2022)   Humiliation, Afraid, Rape, and Kick questionnaire    Fear of Current or Ex-Partner: No    Emotionally Abused: No    Physically Abused: No    Sexually Abused: No     Review of Systems    General:  No chills, fever, night sweats or weight changes.  Cardiovascular:  No chest pain, dyspnea on exertion, edema, orthopnea, palpitations, paroxysmal nocturnal dyspnea. Dermatological: No rash, lesions/masses Respiratory: No cough, dyspnea Urologic: No hematuria, dysuria Abdominal:   No nausea,  vomiting, diarrhea, bright red blood per rectum, melena, or hematemesis Neurologic:  No visual changes, wkns, changes in mental status. All other systems reviewed and are otherwise negative except as noted above.     Physical Exam    VS:  BP (!) 158/74 (BP Location: Left Arm, Patient Position: Sitting)   Pulse 62   Ht '4\' 9"'$  (1.448 m)   Wt 113 lb 12.8 oz (51.6 kg)   SpO2 94%   BMI 24.63 kg/m  , BMI Body mass index is 24.63 kg/m.     GEN: Well nourished, well developed, in no acute distress. HEENT: normal. Neck: Supple, no JVD, carotid bruits, or masses.  Cardiac: RRR, no murmurs, rubs, or gallops. No clubbing, cyanosis, edema.  Radials/DP/PT 2+ and equal bilaterally.  Respiratory:  Respirations regular and unlabored, clear to auscultation bilaterally. GI: Soft, nontender, nondistended, BS + x 4. MS: no deformity or atrophy.  Wearing structured back brace for stability (removed during physical examination Skin: warm and dry, no rash. Neuro:  Strength and sensation are intact. Psych: Normal affect.  Accessory Clinical Findings    ECG personally reviewed by me today-not completed this office visit  Lab Results  Component Value Date   WBC 3.6 (L) 07/29/2022   HGB 10.8 (L) 07/29/2022   HCT 32.8 (L) 07/29/2022   MCV 96.8 07/29/2022   PLT 217 07/29/2022   Lab Results  Component Value Date   CREATININE 0.46 07/29/2022   BUN 9 07/29/2022   NA 132 (L) 07/29/2022   K 4.0 07/29/2022   CL 103 07/29/2022   CO2 24 07/29/2022   Lab Results  Component Value Date   ALT 17 07/29/2022   AST 13 (L) 07/29/2022   ALKPHOS 73 07/29/2022   BILITOT 0.6 07/29/2022   Lab Results  Component Value Date   CHOL 145 07/28/2022   HDL 25 (L) 07/28/2022   LDLCALC 71 07/28/2022   TRIG 243 (H) 07/28/2022   CHOLHDL 5.8 07/28/2022    No results found for: "HGBA1C"  Review of Prior Studies: TTE 01/01/2022   1. Left ventricular ejection fraction, by estimation, is 60 to 65%. The  left  ventricle has normal function. The left ventricle has no regional  wall motion abnormalities. Left ventricular diastolic parameters are  consistent with Grade I diastolic  dysfunction (impaired relaxation).   2. Right ventricular systolic function is normal. The right ventricular  size is normal. There is normal pulmonary artery systolic pressure. The  estimated right ventricular systolic pressure is 50.3 mmHg.   3. Left atrial size was moderately dilated.   4. The mitral valve is normal in structure. Mild mitral valve  regurgitation. No evidence of mitral stenosis.   5. The aortic valve is tricuspid. There is mild calcification of the  aortic valve. Aortic valve regurgitation is not visualized. No aortic  stenosis is present.   6. The inferior vena cava is normal in size with greater than 50%  respiratory variability, suggesting right atrial pressure of 3 mmHg.    NM Stress 12/30/2021   The study is normal. The study is low risk.   No ST deviation was noted.   Left ventricular function is normal. Nuclear stress EF: 72 %. The left ventricular ejection fraction is hyperdynamic (>65%). End diastolic cavity size is normal.   Prior study not available for comparison.   Low risk stress nuclear study with normal perfusion and normal left ventricular regional and global systolic function.    Assessment & Plan   1.  Hypertension: Patient has been unable to tolerate spironolactone due to hyponatremia, or metoprolol due to bradycardia.  She was started on losartan 50 mg daily and has increased to 100 mg daily with some improvement in hypertension but not at goal.  I rechecked her blood pressure today in the office it was 168/88.  I will start her on low-dose amlodipine 2.5 mg with plans for titration to 5 mg should she tolerate it.  She was not willing to take hydralazine dosing as this was 3 times a day and she did not feel she would be able to remember to take it so often.  I also was worried about  her having orthostatic response due to the vascular dilatation.  She will have her blood pressure checked at the same time every day after being seated for 5 minutes with both feet on the floor.  I will see her again in 2 to 3 weeks for reevaluation of her blood pressure.  Goal of blood pressure for this frail woman would be 130/60.  2.  Acute compression fracture of the T12 vertebral body: This was secondary to mechanical fall.  She is undergoing physical therapy and is wearing a structured back brace.  3.  Chronic depression and anxiety: Continues to grieve the loss of her husband and has been being followed by a psychiatrist and will begin talk therapy with a counselor next week.  She continues to have palpitations which may be related to SSRI therapy which she is currently taking.  Multiple medication changes for SSRI has been done by psychiatry and she has ultimately been restarted on Prozac as this has worked best for her.    Current medicines are reviewed at length with the patient today.  I have spent 25 min's  dedicated to the care of this patient on the date of this encounter to include pre-visit review of records, assessment, management and diagnostic testing,with shared decision making. Signed, Phill Myron. West Pugh, ANP, AACC   09/04/2022 10:06 AM      Office [phone] Fax [phone]  Notice: This dictation was prepared with Dragon dictation along with smaller phrase technology. Any transcriptional errors that result from this process are unintentional and may not be corrected upon review.

## 2022-09-04 ENCOUNTER — Ambulatory Visit: Payer: Medicare Other | Attending: Adult Health | Admitting: Adult Health

## 2022-09-04 ENCOUNTER — Encounter: Payer: Self-pay | Admitting: Adult Health

## 2022-09-04 VITALS — BP 158/74 | HR 62 | Ht <= 58 in | Wt 113.8 lb

## 2022-09-04 DIAGNOSIS — I10 Essential (primary) hypertension: Secondary | ICD-10-CM

## 2022-09-04 DIAGNOSIS — S22089A Unspecified fracture of T11-T12 vertebra, initial encounter for closed fracture: Secondary | ICD-10-CM

## 2022-09-04 DIAGNOSIS — E871 Hypo-osmolality and hyponatremia: Secondary | ICD-10-CM | POA: Diagnosis not present

## 2022-09-04 DIAGNOSIS — R002 Palpitations: Secondary | ICD-10-CM | POA: Diagnosis not present

## 2022-09-04 MED ORDER — LOSARTAN POTASSIUM 100 MG PO TABS: 100.0000 mg | ORAL_TABLET | Freq: Every day | ORAL | 2 refills | Status: DC

## 2022-09-04 MED ORDER — AMLODIPINE BESYLATE 2.5 MG PO TABS: 2.5000 mg | ORAL_TABLET | Freq: Every evening | ORAL | 3 refills | Status: DC

## 2022-09-04 NOTE — Patient Instructions (Signed)
Medication Instructions:  Start Amlodipine 2.5 mg ( Take 1 Tablet Daily In The Evening). *If you need a refill on your cardiac medications before your next appointment, please call your pharmacy*   Lab Work: No Labs If you have labs (blood work) drawn today and your tests are completely normal, you will receive your results only by: [place] (if you have MyChart) OR A paper copy in the mail If you have any lab test that is abnormal or we need to change your treatment, we will call you to review the results.   Testing/Procedures: No Testing   Follow-Up: At [facility], you and your health needs are our priority.  As part of our continuing mission to provide you with exceptional heart care, we have created designated Provider Care Teams.  These Care Teams include your primary Cardiologist (physician) and Advanced Practice Providers (APPs -  Physician Assistants and Nurse Practitioners) who all work together to provide you with the care you need, when you need it.  We recommend signing up for the patient portal called "MyChart".  Sign up information is provided on this After Visit Summary.  MyChart is used to connect with patients for Virtual Visits (Telemedicine).  Patients are able to view lab/test results, encounter notes, upcoming appointments, etc.  Non-urgent messages can be sent to your provider as well.   To learn more about what you can do with MyChart, go to [email or web address].    Your next appointment:   2-3 week(s)  The format for your next appointment:   In Person  Provider:   Jory Sims, DNP, ANP    or, Evalina Field, MD

## 2022-09-12 ENCOUNTER — Encounter (INDEPENDENT_AMBULATORY_CARE_PROVIDER_SITE_OTHER): Payer: Self-pay | Admitting: Gastroenterology

## 2022-09-17 NOTE — Progress Notes (Signed)
Cardiology Clinic Note   Patient Name: Janet Bailey Date of Encounter: 09/18/2022  Primary Care Provider:  Jettie Booze, NP Primary Cardiologist:  Evalina Field, MD  Patient Profile    76 year old female with hx of hypertension, palpitations, and PVC's. Other history includes depression,anxiety and recent mechanical fall leading to admission in 07/2022 causing an acute compression fracture of T12 vertebral body.  She was to undergo PT/OT as outpatient. She was found to have hyponatremia with Na 124. Prior to discharge 130, with discontinuation of Prozac and spironolactone.   Past Medical History    Past Medical History:  Diagnosis Date   Colon polyps    Depression    GERD (gastroesophageal reflux disease)    Hiatal hernia    Hypertension    Past Surgical History:  Procedure Laterality Date   ABDOMINAL HYSTERECTOMY     cataract surgery     CHOLECYSTECTOMY     INGUINAL HERNIA REPAIR     tension free transvaginal tape procedure      Allergies  No Known Allergies  History of Present Illness    [name] returns today after being seen last on 09/04/2022 in the setting of uncontrolled hypertension and inability to tolerate spironolactone due to hyponatremia, or metoprolol due to bradycardia.  On last office visit she remained on losartan 100 mg daily but I added amlodipine 2.5 mg daily with plans to titrate up to 5 mg if blood pressure was not well controlled and she was able to tolerate the medication.    Hydralazine was not well-tolerated as she would have to take it 3 times a day and was often forgetful.  She was to take her blood pressure at the same time every day and bring blood pressure recording documentation to the office on this visit.  She also is being followed by a psychotherapist due to chronic anxiety.  She remain on SSRI however this may be contributing to palpitations that she was experiencing on last office visit.  She comes today with her daughter  with complaints of nausea vomiting and overall feeling tired and worn out after starting the amlodipine 2.5 mg at at bedtime.  She states that the symptoms occurred shortly after beginning the new medication.  She did bring with her blood pressure recording sheet which did show improvement in her blood pressures but unfortunately she is unable to tolerate this medication.  She continues to experience a good bit of anxiety and depression associated with the death of her husband along with chronic pain in her back for which she is wearing a wraparound brace and will continue doing so for 6 weeks.  She no longer takes oxycodone for pain control. Home Medications    Current Outpatient Medications  Medication Sig Dispense Refill   alum & mag hydroxide-simeth (MAALOX/MYLANTA) 200-200-20 MG/5ML suspension Take 15 mLs by mouth every 6 (six) hours as needed for indigestion or heartburn. 355 mL 0   amLODipine (NORVASC) 2.5 MG tablet Take 1 tablet (2.5 mg total) by mouth every evening. 30 tablet 3   Calcium Carbonate-Vitamin D 600-200 MG-UNIT TABS Take by mouth daily.     cholecalciferol (VITAMIN D) 1000 UNITS tablet Take 1,000 Units by mouth 2 (two) times daily.     clonazePAM (KLONOPIN) 0.5 MG tablet Take 0.5 mg by mouth 2 (two) times daily as needed for anxiety.     Cyanocobalamin (B-12 PO) Take 1 tablet by mouth daily.     lidocaine (LIDODERM) 5 %  Place onto the skin.     losartan (COZAAR) 100 MG tablet Take 1 tablet (100 mg total) by mouth daily. Take 1 Tablet Daily. 90 tablet 2   methocarbamol (ROBAXIN) 500 MG tablet Take by mouth.     pantoprazole (PROTONIX) 40 MG tablet Take 1 tablet (40 mg total) by mouth 2 (two) times daily. 60 tablet 0   oxyCODONE (OXY IR/ROXICODONE) 5 MG immediate release tablet Take 1 tablet (5 mg total) by mouth every 6 (six) hours as needed for moderate pain. (Patient not taking: Reported on 09/18/2022) 15 tablet 0   No current facility-administered medications for this visit.      Family History    Family History  Problem Relation Age of Onset   Aneurysm Mother    Heart disease Father    Arrhythmia Sister    Heart disease Sister    Colon cancer Paternal Uncle    Diabetes Daughter        One has diabetes age 1. Other in good health   She indicated that her mother is deceased. She indicated that her father is deceased. She indicated that her sister is alive. She indicated that her brother is alive. She indicated that her daughter is alive. She indicated that her paternal uncle is deceased.  Social History    Social History   Socioeconomic History   Marital status: Widowed    Spouse name: Not on file   Number of children: 2   Years of education: Not on file   Highest education level: Not on file  Occupational History   Occupation: retired  Tobacco Use   Smoking status: Never   Smokeless tobacco: Never  Vaping Use   Vaping Use: Never used  Substance and Sexual Activity   Alcohol use: No   Drug use: No   Sexual activity: Not Currently    Comment: 1st intercourse 60 yo-1 partner  Other Topics Concern   Not on file  Social History Narrative   ** Merged History Encounter **       Social Determinants of Health   Financial Resource Strain: Not on file  Food Insecurity: No Food Insecurity (07/27/2022)   Hunger Vital Sign    Worried About Running Out of Food in the Last Year: Never true    Ran Out of Food in the Last Year: Never true  Transportation Needs: No Transportation Needs (07/27/2022)   PRAPARE - Hydrologist (Medical): No    Lack of Transportation (Non-Medical): No  Physical Activity: Not on file  Stress: Not on file  Social Connections: Not on file  Intimate Partner Violence: Not At Risk (07/27/2022)   Humiliation, Afraid, Rape, and Kick questionnaire    Fear of Current or Ex-Partner: No    Emotionally Abused: No    Physically Abused: No    Sexually Abused: No     Review of Systems    General:  No  chills, fever, night sweats or weight changes.  Cardiovascular:  No chest pain, dyspnea on exertion, edema, orthopnea, palpitations, paroxysmal nocturnal dyspnea. Dermatological: No rash, lesions/masses Respiratory: No cough, dyspnea Urologic: No hematuria, dysuria Abdominal:   No nausea, vomiting, diarrhea, bright red blood per rectum, melena, or hematemesis Neurologic:  No visual changes, wkns, changes in mental status. All other systems reviewed and are otherwise negative except as noted above.     Physical Exam    VS:  BP 138/78 (BP Location: Left Arm, Patient Position: Sitting, Cuff Size: Normal)  Pulse 65   Ht '4\' 9"'$  (1.448 m)   Wt 114 lb 12.8 oz (52.1 kg)   SpO2 95%   BMI 24.84 kg/m  , BMI Body mass index is 24.84 kg/m.     GEN: Well nourished, well developed, in no acute distress. HEENT: normal. Neck: Supple, no JVD, carotid bruits, or masses. Cardiac: RRR, 2/6 systolic murmur, heard best at the right sternal border, rubs, or gallops. No clubbing, cyanosis, edema.  Radials/DP/PT 2+ and equal bilaterally.  Respiratory:  Respirations regular and unlabored, clear to auscultation bilaterally. GI: Soft, nontender, nondistended, BS + x 4. MS: no deformity or atrophy.  Wearing a wraparound hard shell back brace Skin: warm and dry, no rash. Neuro:  Strength and sensation are intact. Psych: Normal affect.  Anxious.  Accessory Clinical Findings    Lab Results  Component Value Date   WBC 3.6 (L) 07/29/2022   HGB 10.8 (L) 07/29/2022   HCT 32.8 (L) 07/29/2022   MCV 96.8 07/29/2022   PLT 217 07/29/2022   Lab Results  Component Value Date   CREATININE 0.46 07/29/2022   BUN 9 07/29/2022   NA 132 (L) 07/29/2022   K 4.0 07/29/2022   CL 103 07/29/2022   CO2 24 07/29/2022   Lab Results  Component Value Date   ALT 17 07/29/2022   AST 13 (L) 07/29/2022   ALKPHOS 73 07/29/2022   BILITOT 0.6 07/29/2022   Lab Results  Component Value Date   CHOL 145 07/28/2022   HDL 25  (L) 07/28/2022   LDLCALC 71 07/28/2022   TRIG 243 (H) 07/28/2022   CHOLHDL 5.8 07/28/2022    No results found for: "HGBA1C"  Review of Prior Studies: TTE 01/01/2022   1. Left ventricular ejection fraction, by estimation, is 60 to 65%. The  left ventricle has normal function. The left ventricle has no regional  wall motion abnormalities. Left ventricular diastolic parameters are  consistent with Grade I diastolic  dysfunction (impaired relaxation).   2. Right ventricular systolic function is normal. The right ventricular  size is normal. There is normal pulmonary artery systolic pressure. The  estimated right ventricular systolic pressure is 56.3 mmHg.   3. Left atrial size was moderately dilated.   4. The mitral valve is normal in structure. Mild mitral valve  regurgitation. No evidence of mitral stenosis.   5. The aortic valve is tricuspid. There is mild calcification of the  aortic valve. Aortic valve regurgitation is not visualized. No aortic  stenosis is present.   6. The inferior vena cava is normal in size with greater than 50%  respiratory variability, suggesting right atrial pressure of 3 mmHg.    NM Stress 12/30/2021   The study is normal. The study is low risk.   No ST deviation was noted.   Left ventricular function is normal. Nuclear stress EF: 72 %. The left ventricular ejection fraction is hyperdynamic (>65%). End diastolic cavity size is normal.   Prior study not available for comparison.   Low risk stress nuclear study with normal perfusion and normal left ventricular regional and global systolic function.  Assessment & Plan   1.  Hypertension: The patient did not tolerate addition of amlodipine 2.5 mg to her regimen which included losartan 100 mg.  She states she got nauseated and felt bad.  She is under a lot of stress and having a lot of pain from her back which may be causing some of her issues with hypertension not being well controlled.  I am going to back off  on the amlodipine and continue to monitor this.  She is to follow-up with your PCP as well as her psychiatrist concerning anxiety, depression, and ongoing pain management.  If blood pressure remains elevated greater than 160/90 we will have to consider adding another medication such as chlorthalidone, or HCTZ.  She was not able to tolerate hydralazine.  2.  Chronic anxiety and depression: Patient is to follow-up with PCP and psychiatry for adjustments in medications.  She has had several changes in her antidepressant/antianxiety medications.  3.  Thoracic compression fracture of the T12 vertebral body.  Followed by orthopedics.      Current medicines are reviewed at length with the patient today.  I have spent 30 min's  dedicated to the care of this patient on the date of this encounter to include pre-visit review of records, assessment, management and diagnostic testing,with shared decision making. Signed, Phill Myron. West Pugh, ANP, AACC   09/18/2022 1:49 PM      Office [phone] Fax [phone]  Notice: This dictation was prepared with Dragon dictation along with smaller phrase technology. Any transcriptional errors that result from this process are unintentional and may not be corrected upon review.

## 2022-09-18 ENCOUNTER — Encounter: Payer: Self-pay | Admitting: Adult Health

## 2022-09-18 ENCOUNTER — Ambulatory Visit: Payer: Medicare Other | Attending: Adult Health | Admitting: Adult Health

## 2022-09-18 VITALS — BP 138/78 | HR 65 | Ht <= 58 in | Wt 114.8 lb

## 2022-09-18 DIAGNOSIS — R110 Nausea: Secondary | ICD-10-CM | POA: Diagnosis not present

## 2022-09-18 DIAGNOSIS — F32A Depression, unspecified: Secondary | ICD-10-CM | POA: Diagnosis not present

## 2022-09-18 DIAGNOSIS — E871 Hypo-osmolality and hyponatremia: Secondary | ICD-10-CM | POA: Diagnosis not present

## 2022-09-18 DIAGNOSIS — S22089S Unspecified fracture of T11-T12 vertebra, sequela: Secondary | ICD-10-CM | POA: Diagnosis not present

## 2022-09-18 DIAGNOSIS — I10 Essential (primary) hypertension: Secondary | ICD-10-CM | POA: Diagnosis not present

## 2022-09-18 DIAGNOSIS — Z23 Encounter for immunization: Secondary | ICD-10-CM | POA: Diagnosis not present

## 2022-09-18 NOTE — Patient Instructions (Signed)
Medication Instructions:  Your physician recommends that you continue on your current medications as directed. Please refer to the Current Medication list given to you today.  *If you need a refill on your cardiac medications before your next appointment, please call your pharmacy*  Lab Work: NONE ordered at this time of appointment   If you have labs (blood work) drawn today and your tests are completely normal, you will receive your results only by: [place] (if you have MyChart) OR A paper copy in the mail If you have any lab test that is abnormal or we need to change your treatment, we will call you to review the results.  Testing/Procedures: NONE ordered at this time of appointment   Follow-Up: At [facility], you and your health needs are our priority.  As part of our continuing mission to provide you with exceptional heart care, we have created designated Provider Care Teams.  These Care Teams include your primary Cardiologist (physician) and Advanced Practice Providers (APPs -  Physician Assistants and Nurse Practitioners) who all work together to provide you with the care you need, when you need it.  Your next appointment:   3 month(s)  The format for your next appointment:   In Person  Provider:   Evalina Field, MD     Other Instructions  Important Information About Sugar

## 2022-09-28 DIAGNOSIS — H04123 Dry eye syndrome of bilateral lacrimal glands: Secondary | ICD-10-CM | POA: Diagnosis not present

## 2022-09-28 DIAGNOSIS — Z961 Presence of intraocular lens: Secondary | ICD-10-CM | POA: Diagnosis not present

## 2022-09-29 ENCOUNTER — Other Ambulatory Visit: Payer: Self-pay | Admitting: Student

## 2022-09-29 DIAGNOSIS — S22080A Wedge compression fracture of T11-T12 vertebra, initial encounter for closed fracture: Secondary | ICD-10-CM | POA: Diagnosis not present

## 2022-10-05 DIAGNOSIS — R300 Dysuria: Secondary | ICD-10-CM | POA: Diagnosis not present

## 2022-10-09 ENCOUNTER — Other Ambulatory Visit: Payer: Self-pay | Admitting: Adult Health

## 2022-10-09 ENCOUNTER — Encounter: Payer: Self-pay | Admitting: Adult Health

## 2022-10-09 ENCOUNTER — Encounter: Payer: Self-pay | Admitting: Cardiovascular Disease

## 2022-10-09 MED ORDER — ISOSORBIDE DINITRATE 30 MG PO TABS: 15.0000 mg | ORAL_TABLET | Freq: Two times a day (BID) | ORAL | 1 refills | Status: DC

## 2022-10-09 NOTE — Telephone Encounter (Signed)
Error

## 2022-10-29 DIAGNOSIS — R6889 Other general symptoms and signs: Secondary | ICD-10-CM | POA: Diagnosis not present

## 2022-10-29 DIAGNOSIS — I10 Essential (primary) hypertension: Secondary | ICD-10-CM | POA: Diagnosis not present

## 2022-11-03 ENCOUNTER — Other Ambulatory Visit: Payer: Medicare Other

## 2022-11-17 DIAGNOSIS — R300 Dysuria: Secondary | ICD-10-CM | POA: Diagnosis not present

## 2022-11-17 DIAGNOSIS — R42 Dizziness and giddiness: Secondary | ICD-10-CM | POA: Diagnosis not present

## 2022-11-17 DIAGNOSIS — R5383 Other fatigue: Secondary | ICD-10-CM | POA: Diagnosis not present

## 2022-11-17 DIAGNOSIS — U099 Post covid-19 condition, unspecified: Secondary | ICD-10-CM | POA: Diagnosis not present

## 2022-11-25 DIAGNOSIS — I10 Essential (primary) hypertension: Secondary | ICD-10-CM | POA: Diagnosis not present

## 2022-11-30 DIAGNOSIS — R300 Dysuria: Secondary | ICD-10-CM | POA: Diagnosis not present

## 2022-12-18 ENCOUNTER — Ambulatory Visit: Payer: Medicare Other | Admitting: Adult Health

## 2022-12-28 DIAGNOSIS — I10 Essential (primary) hypertension: Secondary | ICD-10-CM | POA: Diagnosis not present

## 2023-01-05 ENCOUNTER — Ambulatory Visit
Admission: RE | Admit: 2023-01-05 | Discharge: 2023-01-05 | Disposition: A | Discharge status: Home / Self Care | Payer: Medicare Other | Source: Ambulatory Visit | Attending: Student | Admitting: Student

## 2023-01-05 DIAGNOSIS — Z043 Encounter for examination and observation following other accident: Secondary | ICD-10-CM | POA: Diagnosis not present

## 2023-01-05 DIAGNOSIS — S22080A Wedge compression fracture of T11-T12 vertebra, initial encounter for closed fracture: Secondary | ICD-10-CM

## 2023-01-12 DIAGNOSIS — S22080A Wedge compression fracture of T11-T12 vertebra, initial encounter for closed fracture: Secondary | ICD-10-CM | POA: Diagnosis not present

## 2023-01-21 DIAGNOSIS — R296 Repeated falls: Secondary | ICD-10-CM | POA: Diagnosis not present

## 2023-01-21 DIAGNOSIS — R42 Dizziness and giddiness: Secondary | ICD-10-CM | POA: Diagnosis not present

## 2023-01-21 DIAGNOSIS — G4452 New daily persistent headache (NDPH): Secondary | ICD-10-CM | POA: Diagnosis not present

## 2023-01-21 DIAGNOSIS — G319 Degenerative disease of nervous system, unspecified: Secondary | ICD-10-CM | POA: Diagnosis not present

## 2023-01-22 DIAGNOSIS — K219 Gastro-esophageal reflux disease without esophagitis: Secondary | ICD-10-CM | POA: Diagnosis not present

## 2023-01-22 DIAGNOSIS — K449 Diaphragmatic hernia without obstruction or gangrene: Secondary | ICD-10-CM | POA: Diagnosis not present

## 2023-02-01 IMAGING — MG MM DIGITAL SCREENING BILAT W/ TOMO AND CAD
8 series · 9 of 24 positions shown · non-contrast
Comparison: Previous exam(s).

CLINICAL DATA: Screening.

EXAM:
DIGITAL SCREENING BILATERAL MAMMOGRAM WITH TOMOSYNTHESIS AND CAD
TECHNIQUE: Bilateral screening digital craniocaudal and mediolateral oblique
mammograms were obtained. Bilateral screening digital breast
tomosynthesis was performed. The images were evaluated with
computer-aided detection.

[R MLO synth-2D]
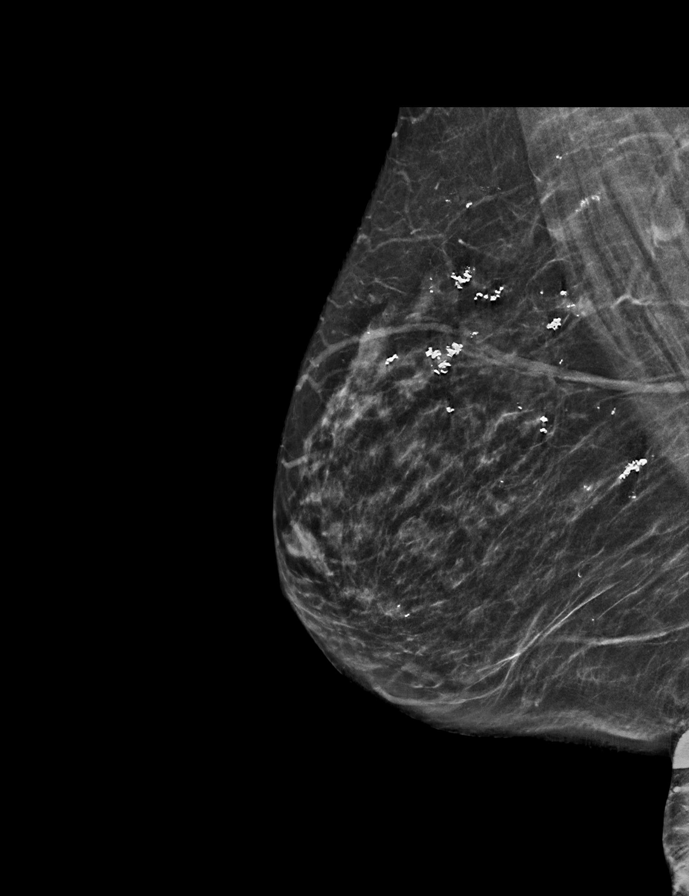

[R CC synth-2D]
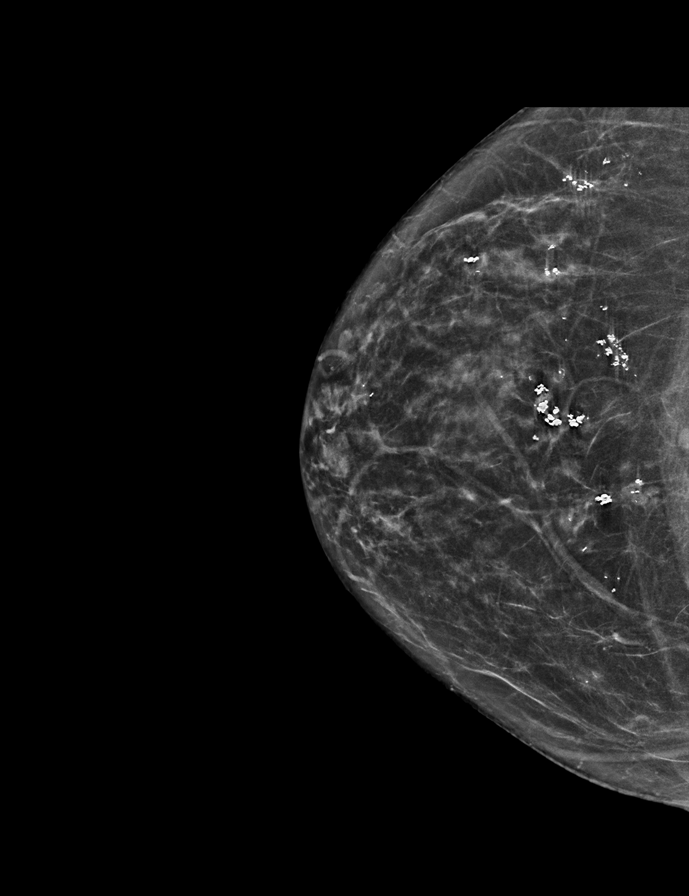

[L CC synth-2D]
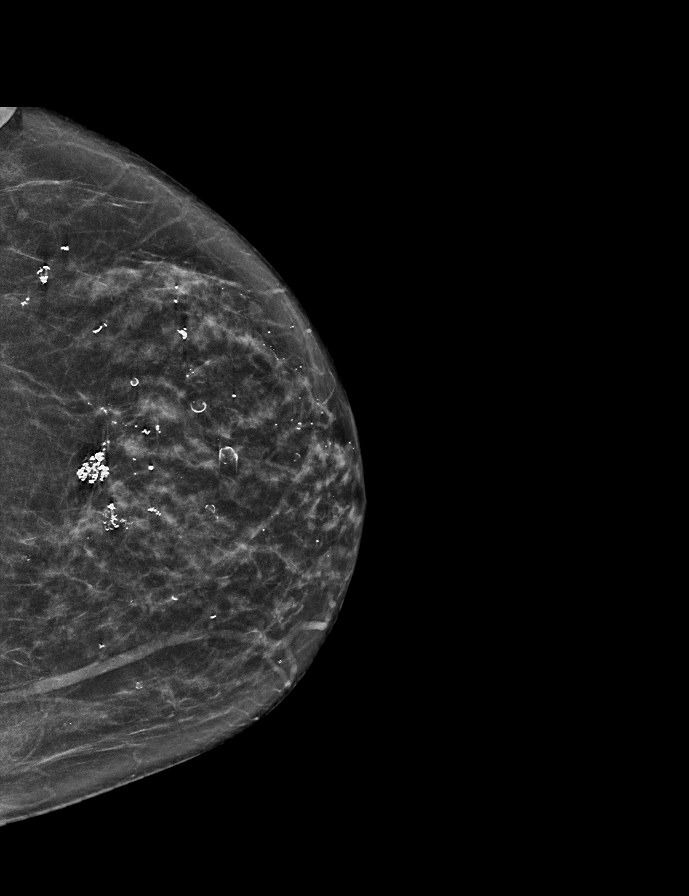

[L MLO synth-2D]
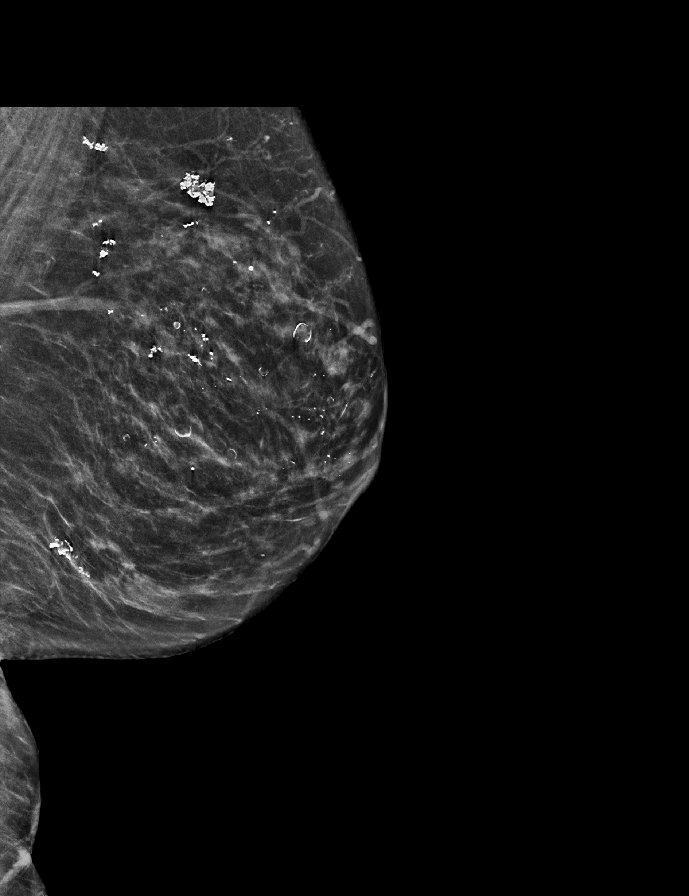

[R MLO tomo · 2 of 54 frames shown]
[frame 18/54]
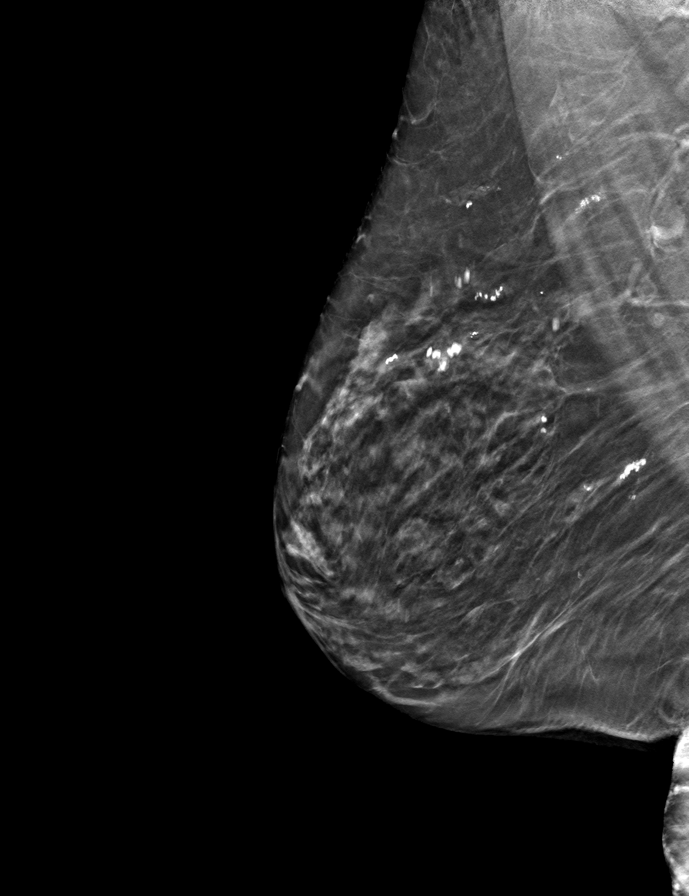
[frame 27/54]
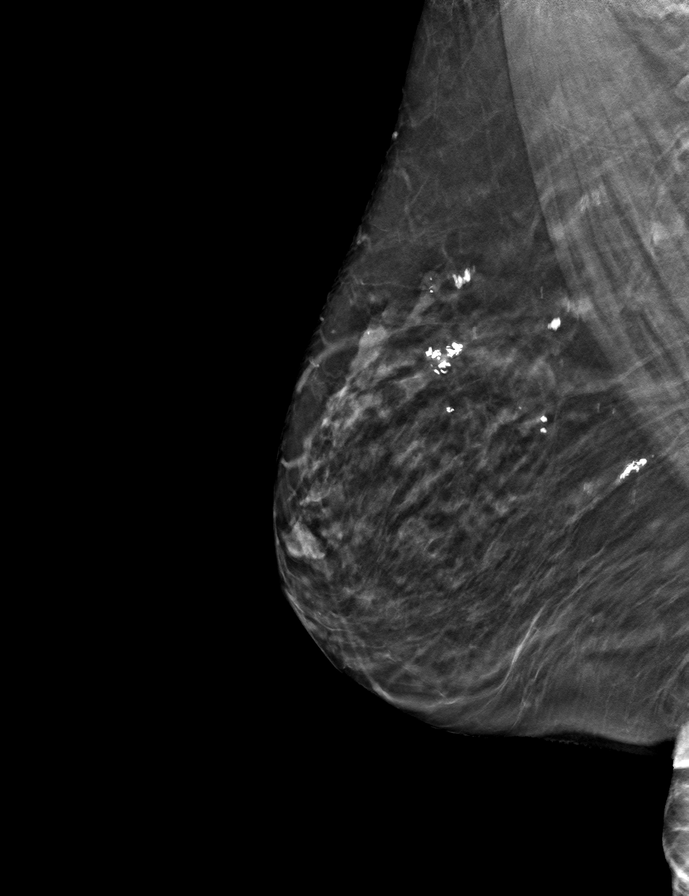

[L MLO tomo · tomo slice 27/53.0]
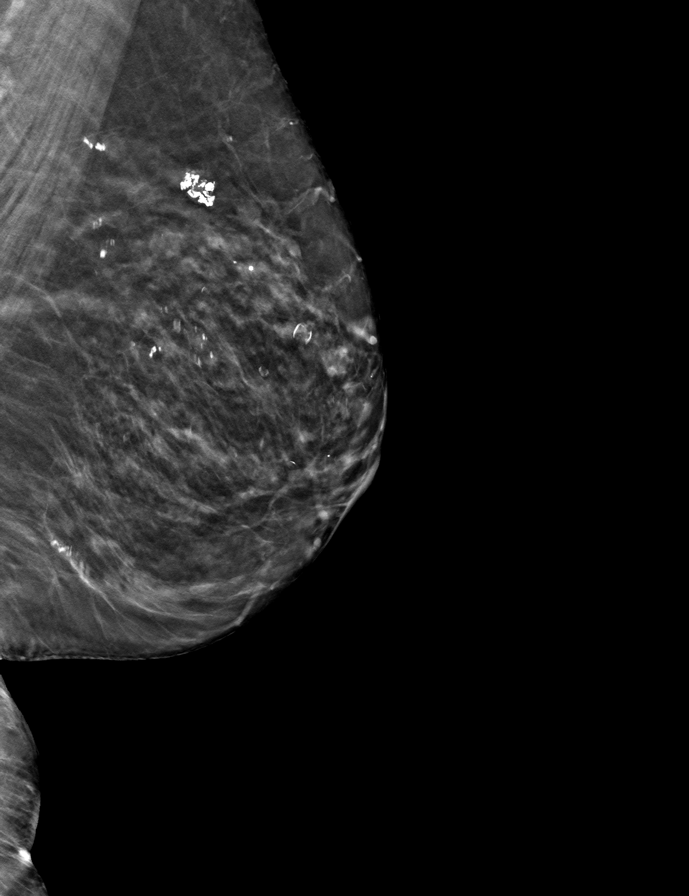

[L CC tomo · tomo slice 27/53.0]
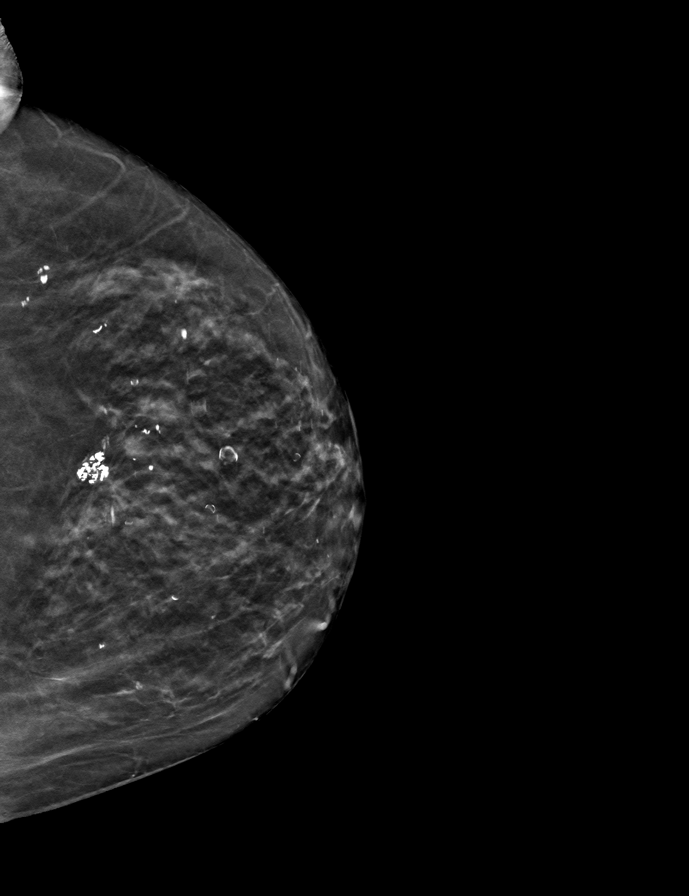

[R CC tomo · tomo slice 28/55.0]
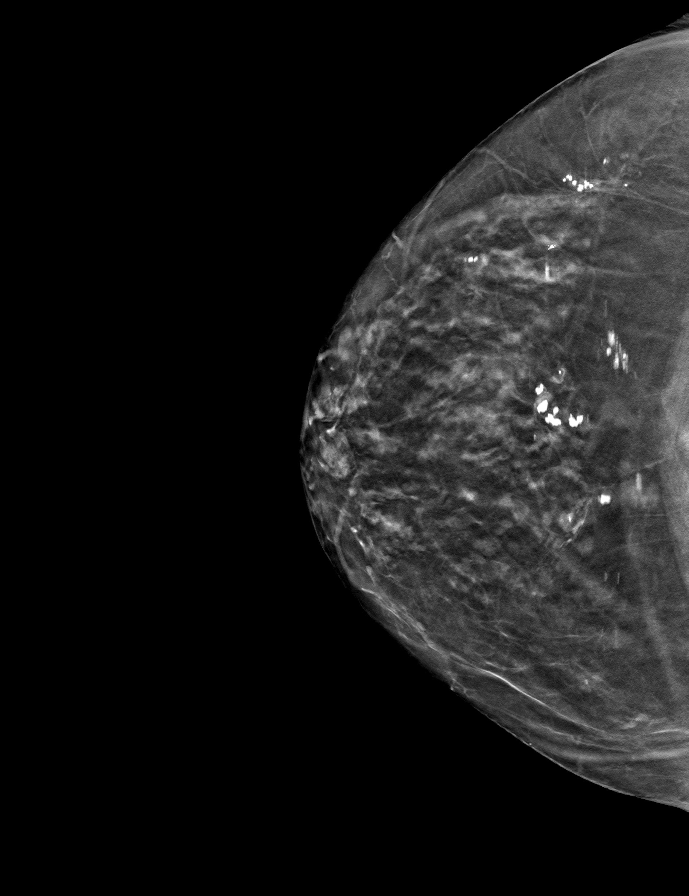

[9 of 24 positions shown; findings below may reference images not displayed]

ACR Breast Density Category c: The breast tissue is heterogeneously
dense, which may obscure small masses.
FINDINGS: There are no findings suspicious for malignancy. The images were
evaluated with computer-aided detection.
IMPRESSION: No mammographic evidence of malignancy. A result letter of this
screening mammogram will be mailed directly to the patient.

RECOMMENDATION:
Screening mammogram in one year. (Code:T4-5-GWO)

BI-RADS CATEGORY  1: Negative.

## 2023-02-02 NOTE — Progress Notes (Signed)
Surgery orders requested via Epic inbox. °

## 2023-02-03 DIAGNOSIS — R300 Dysuria: Secondary | ICD-10-CM | POA: Diagnosis not present

## 2023-02-04 NOTE — Patient Instructions (Signed)
SURGICAL WAITING ROOM VISITATION  Patients having surgery or a procedure may have no more than 2 support people in the waiting area - these visitors may rotate.    Children under the age of 12 must have an adult with them who is not the patient.  Due to an increase in RSV and influenza rates and associated hospitalizations, children ages 71 and under may not visit patients in [place].  If the patient needs to stay at the hospital during part of their recovery, the visitor guidelines for inpatient rooms apply. Pre-op nurse will coordinate an appropriate time for 1 support person to accompany patient in pre-op.  This support person may not rotate.    Please refer to the [facility] website for the visitor guidelines for Inpatients (after your surgery is over and you are in a regular room).    Your procedure is scheduled on: 02/09/23   Report to [facility] Main Entrance    Report to admitting at 7:15 AM   Call this number if you have problems the morning of surgery [phone]   Do not eat food :After Midnight.   After Midnight you may have the following liquids until 6:30 AM DAY OF SURGERY  Water Non-Citrus Juices (without pulp, NO RED-Apple, White grape, White cranberry) Black Coffee (NO MILK/CREAM OR CREAMERS, sugar ok)  Clear Tea (NO MILK/CREAM OR CREAMERS, sugar ok) regular and decaf                             Plain Jell-O (NO RED)                                           Fruit ices (not with fruit pulp, NO RED)                                     Popsicles (NO RED)                                                               Sports drinks like Gatorade (NO RED)              Drink 2 Ensure/G2 drinks AT 10:00 PM the night before surgery.        The day of surgery:  Drink ONE (1) Pre-Surgery Clear Ensure or G2 at AM the morning of surgery. Drink in one sitting. Do not sip.  This drink was given to you during your hospital  pre-op appointment  visit. Nothing else to drink after completing the  Pre-Surgery Clear Ensure or G2.          If you have questions, please contact your surgeon's office.   FOLLOW BOWEL PREP AND ANY ADDITIONAL PRE OP INSTRUCTIONS YOU RECEIVED FROM YOUR SURGEON'S OFFICE!!!     Oral Hygiene is also important to reduce your risk of infection.  Remember - BRUSH YOUR TEETH THE MORNING OF SURGERY WITH YOUR REGULAR TOOTHPASTE  DENTURES WILL BE REMOVED PRIOR TO SURGERY PLEASE DO NOT APPLY "Poly grip" OR ADHESIVES!!!   Do NOT smoke after Midnight   Take these medicines the morning of surgery with A SIP OF WATER: Citalopram, Clonazepam, Meclizine, Omeprazole   DO NOT TAKE ANY ORAL DIABETIC MEDICATIONS DAY OF YOUR SURGERY  Bring CPAP mask and tubing day of surgery.                              You may not have any metal on your body including hair pins, jewelry, and body piercing             Do not wear make-up, lotions, powders, perfumes, or deodorant  Do not wear nail polish including gel and S&S, artificial/acrylic nails, or any other type of covering on natural nails including finger and toenails. If you have artificial nails, gel coating, etc. that needs to be removed by a nail salon please have this removed prior to surgery or surgery may need to be canceled/ delayed if the surgeon/ anesthesia feels like they are unable to be safely monitored.   Do not shave  48 hours prior to surgery.    Do not bring valuables to the hospital. [place].   Contacts, glasses, dentures or bridgework may not be worn into surgery.   Bring small overnight bag day of surgery.   DO NOT [place]. PHARMACY WILL DISPENSE MEDICATIONS LISTED ON YOUR MEDICATION LIST TO YOU DURING YOUR ADMISSION [place]!   Special Instructions: Bring a copy of your healthcare power of attorney and living will documents  the day of surgery if you haven't scanned them before.              Please read over the following fact sheets you were given: IF [place] [phone] Schneiders    If you received a COVID test during your pre-op visit  it is requested that you wear a mask when out in public, stay away from anyone that may not be feeling well and notify your surgeon if you develop symptoms. If you test positive for Covid or have been in contact with anyone that has tested positive in the last 10 days please notify you surgeon.    [place] - Preparing for Surgery Before surgery, you can play an important role.  Because skin is not sterile, your skin needs to be as free of germs as possible.  You can reduce the number of germs on your skin by washing with CHG (chlorahexidine gluconate) soap before surgery.  CHG is an antiseptic cleaner which kills germs and bonds with the skin to continue killing germs even after washing. Please DO NOT use if you have an allergy to CHG or antibacterial soaps.  If your skin becomes reddened/irritated stop using the CHG and inform your nurse when you arrive at Short Stay. Do not shave (including legs and underarms) for at least 48 hours prior to the first CHG shower.  You may shave your face/neck.  Please follow these instructions carefully:  1.  Shower with CHG Soap the night before surgery and the  morning of surgery.  2.  If you choose to  wash your hair, wash your hair first as usual with your normal  shampoo.  3.  After you shampoo, rinse your hair and body thoroughly to remove the shampoo.                             4.  Use CHG as you would any other liquid soap.  You can apply chg directly to the skin and wash.  Gently with a scrungie or clean washcloth.  5.  Apply the CHG Soap to your body ONLY FROM THE NECK DOWN.   Do   not use on face/ open                           Wound or open sores. Avoid contact with eyes, ears mouth  and   genitals (private parts).                       Wash face,  Genitals (private parts) with your normal soap.             6.  Wash thoroughly, paying special attention to the area where your    surgery  will be performed.  7.  Thoroughly rinse your body with warm water from the neck down.  8.  DO NOT shower/wash with your normal soap after using and rinsing off the CHG Soap.                9.  Pat yourself dry with a clean towel.            10.  Wear clean pajamas.            11.  Place clean sheets on your bed the night of your first shower and do not  sleep with pets. Day of Surgery : Do not apply any lotions/deodorants the morning of surgery.  Please wear clean clothes to the hospital/surgery center.  FAILURE TO FOLLOW THESE INSTRUCTIONS [place] RESULT IN THE CANCELLATION OF YOUR SURGERY  PATIENT SIGNATURE_________________________________  NURSE SIGNATURE__________________________________  ________________________________________________________________________

## 2023-02-04 NOTE — Progress Notes (Signed)
Please place orders for PAT appointment scheduled 02/05/23.

## 2023-02-04 NOTE — Progress Notes (Addendum)
COVID Vaccine Completed: yes  Date of COVID positive in last 90 days: no  PCP - Kathlen BrunswickMarsha White, NP Cardiologist - Lennie OdorWesley O'Neal, MD  Chest x-ray - 03/07/22 Epic EKG - 07/27/22 Epic Stress Test - 12/30/21 Epic ECHO - 01/01/22 Epic Cardiac Cath - n/a Pacemaker/ICD device last checked: n/a Spinal Cord Stimulator: n/a  Bowel Prep - no  Sleep Study - n/a CPAP -   Fasting Blood Sugar - n/a Checks Blood Sugar _____ times a day  Last dose of GLP1 agonist-  N/A GLP1 instructions:  N/A   Last dose of SGLT-2 inhibitors-  N/A SGLT-2 instructions: N/A   Blood Thinner Instructions: n/a Aspirin Instructions: Last Dose:  Activity level: Can go up a flight of stairs and perform activities of daily living without stopping and without symptoms of chest pain or shortness of breath.   Anesthesia review: palpitations, HTN  Patient denies shortness of breath, fever, cough and chest pain at PAT appointment  Patient verbalized understanding of instructions that were given to them at the PAT appointment. Patient was also instructed that they will need to review over the PAT instructions again at home before surgery.

## 2023-02-05 ENCOUNTER — Encounter (HOSPITAL_COMMUNITY)
Admission: RE | Admit: 2023-02-05 | Discharge: 2023-02-05 | Disposition: A | Discharge status: Home / Self Care | Payer: Medicare Other | Source: Ambulatory Visit | Attending: Surgery | Admitting: Surgery

## 2023-02-05 ENCOUNTER — Other Ambulatory Visit: Payer: Self-pay

## 2023-02-05 ENCOUNTER — Encounter (HOSPITAL_COMMUNITY): Payer: Self-pay

## 2023-02-05 DIAGNOSIS — F419 Anxiety disorder, unspecified: Secondary | ICD-10-CM | POA: Diagnosis not present

## 2023-02-05 DIAGNOSIS — Z01812 Encounter for preprocedural laboratory examination: Secondary | ICD-10-CM | POA: Diagnosis not present

## 2023-02-05 DIAGNOSIS — K219 Gastro-esophageal reflux disease without esophagitis: Secondary | ICD-10-CM | POA: Diagnosis not present

## 2023-02-05 DIAGNOSIS — I10 Essential (primary) hypertension: Secondary | ICD-10-CM | POA: Diagnosis not present

## 2023-02-05 DIAGNOSIS — Z8673 Personal history of transient ischemic attack (TIA), and cerebral infarction without residual deficits: Secondary | ICD-10-CM | POA: Insufficient documentation

## 2023-02-05 DIAGNOSIS — K449 Diaphragmatic hernia without obstruction or gangrene: Secondary | ICD-10-CM | POA: Diagnosis not present

## 2023-02-05 DIAGNOSIS — I493 Ventricular premature depolarization: Secondary | ICD-10-CM | POA: Insufficient documentation

## 2023-02-05 DIAGNOSIS — I700 Atherosclerosis of aorta: Secondary | ICD-10-CM | POA: Diagnosis not present

## 2023-02-05 HISTORY — DX: Cardiac arrhythmia, unspecified: I49.9

## 2023-02-05 HISTORY — DX: Transient cerebral ischemic attack, unspecified: G45.9

## 2023-02-05 HISTORY — DX: Unspecified osteoarthritis, unspecified site: M19.90

## 2023-02-05 HISTORY — DX: Headache, unspecified: R51.9

## 2023-02-05 LAB — BASIC METABOLIC PANEL
Anion gap: 7 (ref 5–15)
BUN: <5 mg/dL — ABNORMAL LOW (ref 8–23)
CO2: 30 mmol/L (ref 22–32)
Calcium: 9.1 mg/dL (ref 8.9–10.3)
Chloride: 102 mmol/L (ref 98–111)
Creatinine, Ser: 0.48 mg/dL (ref 0.44–1.00)
GFR, Estimated: >60 mL/min (ref >60)
Glucose, Bld: 105 mg/dL — ABNORMAL HIGH (ref 70–99)
Potassium: 3 mmol/L — ABNORMAL LOW (ref 3.5–5.1)
Sodium: 139 mmol/L (ref 135–145)

## 2023-02-05 LAB — CBC
HCT: 37.6 % (ref 36.0–46.0)
Hemoglobin: 12.5 g/dL (ref 12.0–15.0)
MCH: 31 pg (ref 26.0–34.0)
MCHC: 33.2 g/dL (ref 30.0–36.0)
MCV: 93.3 fL (ref 80.0–100.0)
Platelets: 258 10*3/uL (ref 150–400)
RBC: 4.03 MIL/uL (ref 3.87–5.11)
RDW: 13.2 % (ref 11.5–15.5)
WBC: 9 10*3/uL (ref 4.0–10.5)
nRBC: 0 % (ref 0.0–0.2)

## 2023-02-05 NOTE — Patient Instructions (Signed)
SURGICAL WAITING ROOM VISITATION  Patients having surgery or a procedure may have no more than 2 support people in the waiting area - these visitors may rotate.    Children under the age of 78 must have an adult with them who is not the patient.  Due to an increase in RSV and influenza rates and associated hospitalizations, children ages 212 and under may not visit patients in [facility].  If the patient needs to stay at the hospital during part of their recovery, the visitor guidelines for inpatient rooms apply. Pre-op nurse will coordinate an appropriate time for 1 support person to accompany patient in pre-op.  This support person may not rotate.    Please refer to the [facility] website for the visitor guidelines for Inpatients (after your surgery is over and you are in a regular room).    Your procedure is scheduled on: 02/09/23   Report to [facility] Main Entrance    Report to admitting at 7:15 AM   Call this number if you have problems the morning of surgery [phone]   Do not eat food :After Midnight.   After Midnight you may have the following liquids until 6:30 AM DAY OF SURGERY  Water Non-Citrus Juices (without pulp, NO RED-Apple, White grape, White cranberry) Black Coffee (NO MILK/CREAM OR CREAMERS, sugar ok)  Clear Tea (NO MILK/CREAM OR CREAMERS, sugar ok) regular and decaf                             Plain Jell-O (NO RED)                                           Fruit ices (not with fruit pulp, NO RED)                                     Popsicles (NO RED)                                                               Sports drinks like Gatorade (NO RED)          If you have questions, please contact your surgeon's office.   FOLLOW BOWEL PREP AND ANY ADDITIONAL PRE OP INSTRUCTIONS YOU RECEIVED FROM YOUR SURGEON'S OFFICE!!!     Oral Hygiene is also important to reduce your risk of infection.                                    Remember - BRUSH  YOUR TEETH THE MORNING OF SURGERY WITH YOUR REGULAR TOOTHPASTE  DENTURES WILL BE REMOVED PRIOR TO SURGERY PLEASE DO NOT APPLY "Poly grip" OR ADHESIVES!!!    Take these medicines the morning of surgery with A SIP OF WATER: Citalopram, Clonazepam, Meclizine, Omeprazole                               You may not have  any metal on your body including hair pins, jewelry, and body piercing             Do not wear make-up, lotions, powders, perfumes, or deodorant  Do not wear nail polish including gel and S&S, artificial/acrylic nails, or any other type of covering on natural nails including finger and toenails. If you have artificial nails, gel coating, etc. that needs to be removed by a nail salon please have this removed prior to surgery or surgery may need to be canceled/ delayed if the surgeon/ anesthesia feels like they are unable to be safely monitored.   Do not shave  48 hours prior to surgery.    Do not bring valuables to the hospital. [place] IS NOT             RESPONSIBLE   FOR VALUABLES.   Contacts, glasses, dentures or bridgework may not be worn into surgery.   Bring small overnight bag day of surgery.   DO NOT BRING YOUR HOME MEDICATIONS TO THE HOSPITAL. PHARMACY WILL DISPENSE MEDICATIONS LISTED ON YOUR MEDICATION LIST TO YOU DURING YOUR ADMISSION IN THE HOSPITAL!   Special Instructions: Bring a copy of your healthcare power of attorney and living will documents the day of surgery if you haven't scanned them before.              Please read over the following fact sheets you were given: IF YOU HAVE QUESTIONS ABOUT YOUR PRE-OP INSTRUCTIONS PLEASE CALL [name]   If you received a COVID test during your pre-op visit  it is requested that you wear a mask when out in public, stay away from anyone that may not be feeling well and notify your surgeon if you develop symptoms. If you test positive for Covid or have been in contact with anyone that has tested positive in  the last 10 days please notify you surgeon.    [place] - Preparing for Surgery Before surgery, you can play an important role.  Because skin is not sterile, your skin needs to be as free of germs as possible.  You can reduce the number of germs on your skin by washing with CHG (chlorahexidine gluconate) soap before surgery.  CHG is an antiseptic cleaner which kills germs and bonds with the skin to continue killing germs even after washing. Please DO NOT use if you have an allergy to CHG or antibacterial soaps.  If your skin becomes reddened/irritated stop using the CHG and inform your nurse when you arrive at Short Stay. Do not shave (including legs and underarms) for at least 48 hours prior to the first CHG shower.  You may shave your face/neck.  Please follow these instructions carefully:  1.  Shower with CHG Soap the night before surgery and the  morning of surgery.  2.  If you choose to wash your hair, wash your hair first as usual with your normal  shampoo.  3.  After you shampoo, rinse your hair and body thoroughly to remove the shampoo.                             4.  Use CHG as you would any other liquid soap.  You can apply chg directly to the skin and wash.  Gently with a scrungie or clean washcloth.  5.  Apply the CHG Soap to your body ONLY FROM THE NECK DOWN.   Do   not use  on face/ open                           Wound or open sores. Avoid contact with eyes, ears mouth and   genitals (private parts).                       Wash face,  Genitals (private parts) with your normal soap.             6.  Wash thoroughly, paying special attention to the area where your    surgery  will be performed.  7.  Thoroughly rinse your body with warm water from the neck down.  8.  DO NOT shower/wash with your normal soap after using and rinsing off the CHG Soap.                9.  Pat yourself dry with a clean towel.            10.  Wear clean pajamas.            11.  Place clean sheets on your bed  the night of your first shower and do not  sleep with pets. Day of Surgery : Do not apply any lotions/deodorants the morning of surgery.  Please wear clean clothes to the hospital/surgery center.  FAILURE TO FOLLOW THESE INSTRUCTIONS MAY RESULT IN THE CANCELLATION OF YOUR SURGERY  PATIENT SIGNATURE_________________________________  NURSE SIGNATURE__________________________________  ________________________________________________________________________

## 2023-02-08 ENCOUNTER — Encounter (HOSPITAL_COMMUNITY): Payer: Self-pay

## 2023-02-08 NOTE — Progress Notes (Signed)
Case: [id number] Date/Time: 02/09/23 0915   Procedure: LAPAROSCOPIC HIATAL HERNIA REPAIR   Anesthesia type: General   Pre-op diagnosis: TYPE 3 HIATAL HERNIA   Location: [facility] ROOM 01 / [facility]   Surgeons: Luretha Murphy, MD       DISCUSSION: Janet Bailey is a 77 yo female, never smoker, who presents for surgery listed above on 02/09/23. PMH remarkable for hx of TIA/CVA, GERD, anxiety, HTN, palpitations/PVCs. Anesthesia asked to review for PMH of HTN and palpitations/PVCs.   #HTN -Followed by PCP and Cardiology. Has long standing hx of HTN which has been difficult to control due to patient's multiple intolerance of different classes of antihypertensives.  -BP 176/79 at pre op appt which is uncontrolled but acceptable  #Palpitations/PVCs -Followed by Cardiology and has had extensive w/u which has been largely reassuring.  -K is 3.0 at PAT visit.  VS: BP (!) 176/79   Pulse 61   Temp 36.8 C (Oral)   Resp 14   Ht 4\' 8"  (1.422 m)   Wt 45.8 kg   SpO2 97%   BMI 22.64 kg/m      02/05/2023    1:56 PM 09/18/2022    1:47 PM 09/04/2022    9:52 AM  Vitals with BMI  Height 4\' 8"  4\' 9"  4\' 9"   Weight 101 lbs 114 lbs 13 oz 113 lbs 13 oz  BMI 22.66 24.84 24.62  Systolic 176 138 329  Diastolic 79 78 74  Pulse 61 65 62     PROVIDERS: White, Marsha L, NP   LABS: Labs reviewed: Acceptable for surgery. (all labs ordered are listed, but only abnormal results are displayed)  Labs Reviewed  BASIC METABOLIC PANEL - Abnormal; Notable for the following components:      Result Value   Potassium 3.0 (*)    Glucose, Bld 105 (*)    BUN <5 (*)    All other components within normal limits  CBC     IMAGES:  CT thoracic spine w/o contrast 01/06/23:  IMPRESSION: 1. Slightly progression compression deformity at T12, now with 50% height loss along the anterior aspect, previously approximately 40%. Unchanged 3 mm retropulsion. 2. No evidence of high-grade spinal canal stenosis or  new compression deformity.   Aortic Atherosclerosis (ICD10-I70.0).  CXR 03/07/22:  IMPRESSION: No active cardiopulmonary disease.   CV:  EKG 07/27/22: Sinus bradycardia   Echo 01/01/22:  IMPRESSIONS     1. Left ventricular ejection fraction, by estimation, is 60 to 65%. The  left ventricle has normal function. The left ventricle has no regional  wall motion abnormalities. Left ventricular diastolic parameters are  consistent with Grade I diastolic  dysfunction (impaired relaxation).   2. Right ventricular systolic function is normal. The right ventricular  size is normal. There is normal pulmonary artery systolic pressure. The  estimated right ventricular systolic pressure is 22.9 mmHg.   3. Left atrial size was moderately dilated.   4. The mitral valve is normal in structure. Mild mitral valve  regurgitation. No evidence of mitral stenosis.   5. The aortic valve is tricuspid. There is mild calcification of the  aortic valve. Aortic valve regurgitation is not visualized. No aortic  stenosis is present.   6. The inferior vena cava is normal in size with greater than 50%  respiratory variability, suggesting right atrial pressure of 3 mmHg.    Stress test 12/30/21:    The study is normal. The study is low risk.   No ST deviation was noted.  Left ventricular function is normal. Nuclear stress EF: 72 %. The left ventricular ejection fraction is hyperdynamic (>65%). End diastolic cavity size is normal.   Prior study not available for comparison.    Past Medical History:  Diagnosis Date   Arthritis    Colon polyps    Depression    Dysrhythmia    GERD (gastroesophageal reflux disease)    Headache    Hiatal hernia    Hypertension    TIA (transient ischemic attack)     Past Surgical History:  Procedure Laterality Date   ABDOMINAL HYSTERECTOMY     cataract surgery     CHOLECYSTECTOMY     INGUINAL HERNIA REPAIR     tension free transvaginal tape procedure       MEDICATIONS:  alum & mag hydroxide-simeth (MAALOX/MYLANTA) 200-200-20 MG/5ML suspension   Calcium Carbonate-Vitamin D 600-200 MG-UNIT TABS   cholecalciferol (VITAMIN D) 1000 UNITS tablet   citalopram (CELEXA) 20 MG tablet   clonazePAM (KLONOPIN) 0.5 MG tablet   Cyanocobalamin (B-12 PO)   diazepam (VALIUM) 10 MG tablet   isosorbide dinitrate (ISORDIL) 30 MG tablet   lidocaine (LIDODERM) 5 %   lisinopril (ZESTRIL) 20 MG tablet   losartan (COZAAR) 100 MG tablet   meclizine (ANTIVERT) 25 MG tablet   omeprazole (PRILOSEC) 20 MG capsule   pantoprazole (PROTONIX) 40 MG tablet   No current facility-administered medications for this encounter.    Marcille Blanco [facility] Surgical Short Stay/Anesthesiology [facility] Phone [phone]  02/08/2023 9:58 AM

## 2023-02-08 NOTE — H&P (Signed)
Chief Complaint:  large hiatal hernia  History of Present Illness:  Janet Bailey is an 77 y.o. female who initially consulted me over a year ago with a large mixed hiatal hernia that contained sliding elements and paraesophageal elements on UGI.  I reviewed this study with her and her daughter when I saw her recently.  She has had some loss of height with recent T12 compression fracture.    I have explained the operation to her and her daughter and they want to proceed.  We reviewed this procedure in the holding area.  The exact nature of the procedure will be dictated by the anatomic findings.   Past Medical History:  Diagnosis Date   Arthritis    Colon polyps    Depression    Dysrhythmia    GERD (gastroesophageal reflux disease)    Headache    Hiatal hernia    Hypertension    TIA (transient ischemic attack)     Past Surgical History:  Procedure Laterality Date   ABDOMINAL HYSTERECTOMY     cataract surgery     CHOLECYSTECTOMY     INGUINAL HERNIA REPAIR     tension free transvaginal tape procedure      No current facility-administered medications for this encounter.   Current Outpatient Medications  Medication Sig Dispense Refill   alum & mag hydroxide-simeth (MAALOX/MYLANTA) 200-200-20 MG/5ML suspension Take 15 mLs by mouth every 6 (six) hours as needed for indigestion or heartburn. 355 mL 0   Calcium Carbonate-Vitamin D 600-200 MG-UNIT TABS Take 1 tablet by mouth daily.     cholecalciferol (VITAMIN D) 1000 UNITS tablet Take 1,000 Units by mouth 2 (two) times daily.     citalopram (CELEXA) 20 MG tablet Take 10 mg by mouth daily.     clonazePAM (KLONOPIN) 0.5 MG tablet Take 0.5 mg by mouth 2 (two) times daily as needed for anxiety.     Cyanocobalamin (B-12 PO) Take 1 tablet by mouth daily.     diazepam (VALIUM) 10 MG tablet Take 10 mg by mouth at bedtime as needed for sleep.     lidocaine (LIDODERM) 5 % Place 1 patch onto the skin daily as needed (pain).     lisinopril  (ZESTRIL) 20 MG tablet Take 20 mg by mouth in the morning and at bedtime.     meclizine (ANTIVERT) 25 MG tablet Take 25 mg by mouth 3 (three) times daily as needed for dizziness.     omeprazole (PRILOSEC) 20 MG capsule Take 20 mg by mouth in the morning and at bedtime.     isosorbide dinitrate (ISORDIL) 30 MG tablet Take 0.5 tablets (15 mg total) by mouth 2 (two) times daily. (Patient not taking: Reported on 02/03/2023) 30 tablet 1   losartan (COZAAR) 100 MG tablet Take 1 tablet (100 mg total) by mouth daily. Take 1 Tablet Daily. (Patient not taking: Reported on 02/03/2023) 90 tablet 2   pantoprazole (PROTONIX) 40 MG tablet Take 1 tablet (40 mg total) by mouth 2 (two) times daily. (Patient not taking: Reported on 02/03/2023) 60 tablet 0   Patient has no known allergies. Family History  Problem Relation Age of Onset   Aneurysm Mother    Heart disease Father    Arrhythmia Sister    Heart disease Sister    Colon cancer Paternal Uncle    Diabetes Daughter        One has diabetes age 49. Other in good health   Social History:   reports that she  has never smoked. She has never used smokeless tobacco. She reports that she does not drink alcohol and does not use drugs.   REVIEW OF SYSTEMS : Negative except for see problem list  Physical Exam:   There were no vitals taken for this visit. There is no height or weight on file to calculate BMI.  Gen:  WDWN older WF NAD  Neurological: Alert and oriented to person, place, and time. Motor and sensory function is grossly intact  Head: Normocephalic and atraumatic.  Eyes: Conjunctivae are normal. Pupils are equal, round, and reactive to light. No scleral icterus.  Neck: Normal range of motion. Neck supple. No tracheal deviation or thyromegaly present.  Cardiovascular:  SR without murmurs or gallops.  No carotid bruits Breast:  not examined Respiratory: Effort normal.  No respiratory distress. No chest wall tenderness. Breath sounds normal.  No wheezes,  rales or rhonchi.  Abdomen:  nontender GU:  not examined Musculoskeletal: Normal range of motion. Extremities are nontender. No cyanosis, edema or clubbing noted Lymphadenopathy: No cervical, preauricular, postauricular or axillary adenopathy is present Skin: Skin is warm and dry. No rash noted. No diaphoresis. No erythema. No pallor. Pscyh: Normal mood and affect. Behavior is normal. Judgment and thought content normal.   LABORATORY RESULTS: No results found for this or any previous visit (from the past 48 hour(s)).   RADIOLOGY RESULTS: No results found.  Problem List: Patient Active Problem List   Diagnosis Date Noted   Closed T12 fracture 07/27/2022   Depression/Anxiety 07/27/2022   Pelvic fracture 05/15/2020   Closed fracture of right hip 05/14/2020   Hyponatremia 05/14/2020   Hypotension 05/14/2020   Leukocytosis 05/14/2020   GERD (gastroesophageal reflux disease) 02/26/2014   Essential hypertension, benign 02/26/2014   Abdominal pain, right upper quadrant 02/26/2014   Abdominal pain, chronic, right lower quadrant 02/26/2014    Assessment & Plan: GERD is worse with Celexa--maybe motility issue with anatomy    Matt B. Daphine Deutscher, MD, [facility] [phone] beeper [phone]  02/08/2023 9:13 PM

## 2023-02-09 ENCOUNTER — Inpatient Hospital Stay (HOSPITAL_COMMUNITY)
Admission: AD | Admit: 2023-02-09 | Discharge: 2023-02-11 | DRG: 328 | Disposition: A | Discharge status: Home / Self Care | Payer: Medicare Other | Source: Ambulatory Visit | Attending: Surgery | Admitting: Surgery

## 2023-02-09 ENCOUNTER — Other Ambulatory Visit: Payer: Self-pay

## 2023-02-09 ENCOUNTER — Ambulatory Visit (HOSPITAL_COMMUNITY): Payer: Medicare Other | Admitting: Medical

## 2023-02-09 ENCOUNTER — Ambulatory Visit (HOSPITAL_COMMUNITY): Payer: Medicare Other | Admitting: Anesthesiology

## 2023-02-09 ENCOUNTER — Encounter (HOSPITAL_COMMUNITY)
Admission: AD | Disposition: A | Discharge status: Home / Self Care | Payer: Self-pay | Source: Ambulatory Visit | Attending: Surgery

## 2023-02-09 ENCOUNTER — Encounter (HOSPITAL_COMMUNITY): Payer: Self-pay | Admitting: Surgery

## 2023-02-09 DIAGNOSIS — Z8249 Family history of ischemic heart disease and other diseases of the circulatory system: Secondary | ICD-10-CM

## 2023-02-09 DIAGNOSIS — Z833 Family history of diabetes mellitus: Secondary | ICD-10-CM | POA: Diagnosis not present

## 2023-02-09 DIAGNOSIS — Z79899 Other long term (current) drug therapy: Secondary | ICD-10-CM

## 2023-02-09 DIAGNOSIS — K219 Gastro-esophageal reflux disease without esophagitis: Secondary | ICD-10-CM | POA: Diagnosis present

## 2023-02-09 DIAGNOSIS — I10 Essential (primary) hypertension: Secondary | ICD-10-CM

## 2023-02-09 DIAGNOSIS — F32A Depression, unspecified: Secondary | ICD-10-CM | POA: Diagnosis present

## 2023-02-09 DIAGNOSIS — K449 Diaphragmatic hernia without obstruction or gangrene: Secondary | ICD-10-CM | POA: Diagnosis not present

## 2023-02-09 DIAGNOSIS — Z8673 Personal history of transient ischemic attack (TIA), and cerebral infarction without residual deficits: Secondary | ICD-10-CM | POA: Diagnosis not present

## 2023-02-09 DIAGNOSIS — Z800 Family history of malignant neoplasm of digestive organs: Secondary | ICD-10-CM | POA: Diagnosis not present

## 2023-02-09 DIAGNOSIS — M069 Rheumatoid arthritis, unspecified: Secondary | ICD-10-CM

## 2023-02-09 DIAGNOSIS — F419 Anxiety disorder, unspecified: Secondary | ICD-10-CM | POA: Diagnosis present

## 2023-02-09 DIAGNOSIS — Z8719 Personal history of other diseases of the digestive system: Secondary | ICD-10-CM

## 2023-02-09 HISTORY — PX: HIATAL HERNIA REPAIR: SHX195

## 2023-02-09 LAB — CBC
HCT: 35.7 % — ABNORMAL LOW (ref 36.0–46.0)
Hemoglobin: 11.7 g/dL — ABNORMAL LOW (ref 12.0–15.0)
MCH: 31.2 pg (ref 26.0–34.0)
MCHC: 32.8 g/dL (ref 30.0–36.0)
MCV: 95.2 fL (ref 80.0–100.0)
Platelets: 220 10*3/uL (ref 150–400)
RBC: 3.75 MIL/uL — ABNORMAL LOW (ref 3.87–5.11)
RDW: 13.4 % (ref 11.5–15.5)
WBC: 12.9 10*3/uL — ABNORMAL HIGH (ref 4.0–10.5)
nRBC: 0 % (ref 0.0–0.2)

## 2023-02-09 LAB — CREATININE, SERUM
Creatinine, Ser: 0.45 mg/dL (ref 0.44–1.00)
GFR, Estimated: >60 mL/min (ref >60)

## 2023-02-09 SURGERY — REPAIR, HERNIA, HIATAL, LAPAROSCOPIC
Anesthesia: General

## 2023-02-09 MED ORDER — PROMETHAZINE HCL 25 MG/ML IJ SOLN: 6.2500 mg | INTRAMUSCULAR | Status: DC | PRN

## 2023-02-09 MED ORDER — ACETAMINOPHEN 500 MG PO TABS
1000.0000 mg | ORAL_TABLET | Freq: Once | ORAL | Status: AC
  Administered 2023-02-09: 1000 mg via ORAL

## 2023-02-09 MED ORDER — ONDANSETRON HCL 4 MG/2ML IJ SOLN
INTRAMUSCULAR | Status: DC | PRN
  Administered 2023-02-09: 4 mg via INTRAVENOUS

## 2023-02-09 MED ORDER — ACETAMINOPHEN 500 MG PO TABS
1000.0000 mg | ORAL_TABLET | ORAL | Status: DC
  Filled 2023-02-09: qty 2

## 2023-02-09 MED ORDER — MORPHINE SULFATE (PF) 2 MG/ML IV SOLN
1.0000 mg | INTRAVENOUS | Status: DC | PRN
  Administered 2023-02-09 – 2023-02-11 (×5): 1 mg via INTRAVENOUS
  Filled 2023-02-09 (×5): qty 1

## 2023-02-09 MED ORDER — HEPARIN SODIUM (PORCINE) 5000 UNIT/ML IJ SOLN
5000.0000 [IU] | Freq: Three times a day (TID) | INTRAMUSCULAR | Status: DC
  Administered 2023-02-09 – 2023-02-11 (×5): 5000 [IU] via SUBCUTANEOUS
  Filled 2023-02-09 (×5): qty 1

## 2023-02-09 MED ORDER — KCL IN DEXTROSE-NACL 20-5-0.45 MEQ/L-%-% IV SOLN
INTRAVENOUS | Status: DC
  Filled 2023-02-09: qty 1000

## 2023-02-09 MED ORDER — BUPIVACAINE LIPOSOME 1.3 % IJ SUSP
INTRAMUSCULAR | Status: AC
  Filled 2023-02-09: qty 20

## 2023-02-09 MED ORDER — FENTANYL CITRATE (PF) 100 MCG/2ML IJ SOLN
INTRAMUSCULAR | Status: DC | PRN
  Administered 2023-02-09 (×2): 25 ug via INTRAVENOUS
  Administered 2023-02-09 (×3): 50 ug via INTRAVENOUS
  Administered 2023-02-09 (×2): 25 ug via INTRAVENOUS

## 2023-02-09 MED ORDER — MEPERIDINE HCL 50 MG/ML IJ SOLN: 6.2500 mg | INTRAMUSCULAR | Status: DC | PRN

## 2023-02-09 MED ORDER — GLYCOPYRROLATE 0.2 MG/ML IJ SOLN
INTRAMUSCULAR | Status: AC
  Filled 2023-02-09: qty 1

## 2023-02-09 MED ORDER — EPHEDRINE 5 MG/ML INJ
INTRAVENOUS | Status: AC
  Filled 2023-02-09: qty 5

## 2023-02-09 MED ORDER — CHLORHEXIDINE GLUCONATE CLOTH 2 % EX PADS: 6.0000 | MEDICATED_PAD | Freq: Once | CUTANEOUS | Status: DC

## 2023-02-09 MED ORDER — MIDAZOLAM HCL 2 MG/2ML IJ SOLN
INTRAMUSCULAR | Status: AC
  Filled 2023-02-09: qty 2

## 2023-02-09 MED ORDER — FENTANYL CITRATE PF 50 MCG/ML IJ SOSY
PREFILLED_SYRINGE | INTRAMUSCULAR | Status: AC
  Administered 2023-02-09: 25 ug via INTRAVENOUS
  Filled 2023-02-09: qty 1

## 2023-02-09 MED ORDER — MIDAZOLAM HCL 2 MG/2ML IJ SOLN: 0.5000 mg | Freq: Once | INTRAMUSCULAR | Status: DC | PRN

## 2023-02-09 MED ORDER — PHENYLEPHRINE HCL-NACL 20-0.9 MG/250ML-% IV SOLN
INTRAVENOUS | Status: DC | PRN
  Administered 2023-02-09: 20 ug/min via INTRAVENOUS

## 2023-02-09 MED ORDER — FENTANYL CITRATE (PF) 250 MCG/5ML IJ SOLN
INTRAMUSCULAR | Status: AC
  Filled 2023-02-09: qty 5

## 2023-02-09 MED ORDER — PHENYLEPHRINE HCL (PRESSORS) 10 MG/ML IV SOLN
INTRAVENOUS | Status: AC
  Filled 2023-02-09: qty 1

## 2023-02-09 MED ORDER — OXYCODONE HCL 5 MG PO TABS: 5.0000 mg | ORAL_TABLET | Freq: Once | ORAL | Status: DC | PRN

## 2023-02-09 MED ORDER — LIDOCAINE HCL (CARDIAC) PF 100 MG/5ML IV SOSY
PREFILLED_SYRINGE | INTRAVENOUS | Status: DC | PRN
  Administered 2023-02-09: 40 mg via INTRAVENOUS

## 2023-02-09 MED ORDER — LIDOCAINE HCL 2 % IJ SOLN
INTRAMUSCULAR | Status: AC
  Filled 2023-02-09: qty 20

## 2023-02-09 MED ORDER — METOPROLOL TARTRATE 5 MG/5ML IV SOLN: 5.0000 mg | Freq: Four times a day (QID) | INTRAVENOUS | Status: DC | PRN

## 2023-02-09 MED ORDER — ROCURONIUM BROMIDE 10 MG/ML (PF) SYRINGE
PREFILLED_SYRINGE | INTRAVENOUS | Status: AC
  Filled 2023-02-09: qty 10

## 2023-02-09 MED ORDER — SCOPOLAMINE 1 MG/3DAYS TD PT72: 1.0000 | MEDICATED_PATCH | TRANSDERMAL | Status: DC

## 2023-02-09 MED ORDER — HYDROMORPHONE HCL 1 MG/ML IJ SOLN: 0.2500 mg | INTRAMUSCULAR | Status: DC | PRN

## 2023-02-09 MED ORDER — ACETAMINOPHEN 500 MG PO TABS: 1000.0000 mg | ORAL_TABLET | Freq: Once | ORAL | Status: DC

## 2023-02-09 MED ORDER — ORAL CARE MOUTH RINSE: 15.0000 mL | Freq: Once | OROMUCOSAL | Status: AC

## 2023-02-09 MED ORDER — CHLORHEXIDINE GLUCONATE 0.12 % MT SOLN
15.0000 mL | Freq: Once | OROMUCOSAL | Status: AC
  Administered 2023-02-09: 15 mL via OROMUCOSAL

## 2023-02-09 MED ORDER — LIDOCAINE HCL (PF) 2 % IJ SOLN
INTRAMUSCULAR | Status: AC
  Filled 2023-02-09: qty 5

## 2023-02-09 MED ORDER — OXYCODONE HCL 5 MG PO TABS
5.0000 mg | ORAL_TABLET | ORAL | Status: DC | PRN
  Administered 2023-02-10: 5 mg via ORAL
  Administered 2023-02-10 – 2023-02-11 (×2): 10 mg via ORAL
  Filled 2023-02-09: qty 2
  Filled 2023-02-09: qty 1
  Filled 2023-02-09: qty 2

## 2023-02-09 MED ORDER — LACTATED RINGERS IV SOLN: INTRAVENOUS | Status: DC | PRN

## 2023-02-09 MED ORDER — ONDANSETRON HCL 4 MG/2ML IJ SOLN
INTRAMUSCULAR | Status: AC
  Filled 2023-02-09: qty 2

## 2023-02-09 MED ORDER — SUCCINYLCHOLINE CHLORIDE 200 MG/10ML IV SOSY
PREFILLED_SYRINGE | INTRAVENOUS | Status: DC | PRN
  Administered 2023-02-09: 120 mg via INTRAVENOUS

## 2023-02-09 MED ORDER — ALBUMIN HUMAN 5 % IV SOLN: INTRAVENOUS | Status: DC | PRN

## 2023-02-09 MED ORDER — SODIUM CHLORIDE (PF) 0.9 % IJ SOLN
INTRAMUSCULAR | Status: AC
  Filled 2023-02-09: qty 10

## 2023-02-09 MED ORDER — LACTATED RINGERS IV SOLN: INTRAVENOUS | Status: DC

## 2023-02-09 MED ORDER — OXYCODONE HCL 5 MG/5ML PO SOLN: 5.0000 mg | Freq: Once | ORAL | Status: DC | PRN

## 2023-02-09 MED ORDER — DEXAMETHASONE SODIUM PHOSPHATE 10 MG/ML IJ SOLN
INTRAMUSCULAR | Status: DC | PRN
  Administered 2023-02-09: 8 mg via INTRAVENOUS

## 2023-02-09 MED ORDER — PROPOFOL 10 MG/ML IV BOLUS
INTRAVENOUS | Status: DC | PRN
  Administered 2023-02-09: 120 mg via INTRAVENOUS

## 2023-02-09 MED ORDER — CEFAZOLIN SODIUM-DEXTROSE 2-4 GM/100ML-% IV SOLN
2.0000 g | Freq: Three times a day (TID) | INTRAVENOUS | Status: AC
  Administered 2023-02-09: 2 g via INTRAVENOUS
  Filled 2023-02-09: qty 100

## 2023-02-09 MED ORDER — DEXAMETHASONE SODIUM PHOSPHATE 10 MG/ML IJ SOLN
INTRAMUSCULAR | Status: AC
  Filled 2023-02-09: qty 1

## 2023-02-09 MED ORDER — SUCCINYLCHOLINE CHLORIDE 200 MG/10ML IV SOSY
PREFILLED_SYRINGE | INTRAVENOUS | Status: AC
  Filled 2023-02-09: qty 10

## 2023-02-09 MED ORDER — ROCURONIUM BROMIDE 100 MG/10ML IV SOLN
INTRAVENOUS | Status: DC | PRN
  Administered 2023-02-09: 40 mg via INTRAVENOUS
  Administered 2023-02-09: 20 mg via INTRAVENOUS

## 2023-02-09 MED ORDER — EPHEDRINE SULFATE (PRESSORS) 50 MG/ML IJ SOLN
INTRAMUSCULAR | Status: DC | PRN
  Administered 2023-02-09 (×2): 5 mg via INTRAVENOUS
  Administered 2023-02-09: 10 mg via INTRAVENOUS

## 2023-02-09 MED ORDER — PROPOFOL 10 MG/ML IV BOLUS
INTRAVENOUS | Status: AC
  Filled 2023-02-09: qty 20

## 2023-02-09 MED ORDER — FENTANYL CITRATE PF 50 MCG/ML IJ SOSY: 25.0000 ug | PREFILLED_SYRINGE | Freq: Once | INTRAMUSCULAR | Status: AC

## 2023-02-09 MED ORDER — ALBUMIN HUMAN 5 % IV SOLN
INTRAVENOUS | Status: AC
  Filled 2023-02-09: qty 250

## 2023-02-09 MED ORDER — PANTOPRAZOLE SODIUM 40 MG IV SOLR
40.0000 mg | Freq: Every day | INTRAVENOUS | Status: DC
  Administered 2023-02-09: 40 mg via INTRAVENOUS
  Filled 2023-02-09: qty 10

## 2023-02-09 MED ORDER — BUPIVACAINE LIPOSOME 1.3 % IJ SUSP: 20.0000 mL | Freq: Once | INTRAMUSCULAR | Status: DC

## 2023-02-09 MED ORDER — LIDOCAINE HCL (PF) 2 % IJ SOLN
INTRAMUSCULAR | Status: DC | PRN
  Administered 2023-02-09: 1.5 mg/kg/h via INTRADERMAL

## 2023-02-09 MED ORDER — SODIUM CHLORIDE (PF) 0.9 % IJ SOLN
INTRAMUSCULAR | Status: DC | PRN
  Administered 2023-02-09: 30 mL via SURGICAL_CAVITY

## 2023-02-09 MED ORDER — CEFAZOLIN SODIUM-DEXTROSE 2-4 GM/100ML-% IV SOLN
2.0000 g | INTRAVENOUS | Status: AC
  Administered 2023-02-09: 2 g via INTRAVENOUS
  Filled 2023-02-09: qty 100

## 2023-02-09 MED ORDER — SUGAMMADEX SODIUM 200 MG/2ML IV SOLN
INTRAVENOUS | Status: DC | PRN
  Administered 2023-02-09: 200 mg via INTRAVENOUS

## 2023-02-09 MED ORDER — GLYCOPYRROLATE 0.2 MG/ML IJ SOLN
INTRAMUSCULAR | Status: DC | PRN
  Administered 2023-02-09 (×2): .1 mg via INTRAVENOUS

## 2023-02-09 SURGICAL SUPPLY — 54 items
ADH SKN CLS APL DERMABOND .7 (GAUZE/BANDAGES/DRESSINGS) ×1
APPLIER CLIP ROT 10 11.4 M/L (STAPLE)
APR CLP MED LRG 11.4X10 (STAPLE)
BAG COUNTER SPONGE SURGICOUNT (BAG) IMPLANT
BAG SPNG CNTER NS LX DISP (BAG)
BLADE SURG 15 STRL LF DISP TIS (BLADE) ×2 IMPLANT
BLADE SURG 15 STRL SS (BLADE) ×1
CABLE HIGH FREQUENCY MONO STRZ (ELECTRODE) IMPLANT
CLAMP ENDO BABCK 10MM (STAPLE) IMPLANT
CLIP APPLIE ROT 10 11.4 M/L (STAPLE) IMPLANT
COVER SURGICAL LIGHT HANDLE (MISCELLANEOUS) ×2 IMPLANT
DERMABOND ADVANCED .7 DNX12 (GAUZE/BANDAGES/DRESSINGS) ×2 IMPLANT
DEVICE SUT QUICK LOAD TK 5 (SUTURE) IMPLANT
DEVICE SUT TI-KNOT TK 5X26 (SUTURE) IMPLANT
DEVICE SUTURE ENDOST 10MM (ENDOMECHANICALS) ×2 IMPLANT
DISSECTOR BLUNT TIP ENDO 5MM (MISCELLANEOUS) ×2 IMPLANT
DRAIN PENROSE 0.5X18 (DRAIN) ×2 IMPLANT
ELECT L-HOOK LAP 45CM DISP (ELECTROSURGICAL)
ELECT REM PT RETURN 15FT ADLT (MISCELLANEOUS) ×2 IMPLANT
ELECTRODE L-HOOK LAP 45CM DISP (ELECTROSURGICAL) IMPLANT
GAUZE 4X4 16PLY ~~LOC~~+RFID DBL (SPONGE) ×2 IMPLANT
GLOVE SURG LX STRL 8.0 MICRO (GLOVE) ×2 IMPLANT
GOWN STRL REUS W/ TWL XL LVL3 (GOWN DISPOSABLE) ×6 IMPLANT
GOWN STRL REUS W/TWL XL LVL3 (GOWN DISPOSABLE) ×3
GRASPER ENDO BABCOCK 10 (MISCELLANEOUS) IMPLANT
GRASPER ENDO BABCOCK 10MM (MISCELLANEOUS)
GRASPER SUT TROCAR 14GX15 (MISCELLANEOUS) IMPLANT
IRRIG SUCT STRYKERFLOW 2 WTIP (MISCELLANEOUS) ×1
IRRIGATION SUCT STRKRFLW 2 WTP (MISCELLANEOUS) ×2 IMPLANT
KIT BASIN OR (CUSTOM PROCEDURE TRAY) ×2 IMPLANT
KIT TURNOVER KIT A (KITS) IMPLANT
NDL HYPO 22X1.5 SAFETY MO (MISCELLANEOUS) ×2 IMPLANT
NEEDLE HYPO 22X1.5 SAFETY MO (MISCELLANEOUS) ×1 IMPLANT
PACK CARDIOVASCULAR III (CUSTOM PROCEDURE TRAY) ×2 IMPLANT
SCISSORS LAP 5X45 EPIX DISP (ENDOMECHANICALS) ×2 IMPLANT
SET TUBE SMOKE EVAC HIGH FLOW (TUBING) ×2 IMPLANT
SHEARS HARMONIC ACE PLUS 45CM (MISCELLANEOUS) ×2 IMPLANT
SLEEVE ADV FIXATION 12X100MM (TROCAR) ×4 IMPLANT
SLEEVE ADV FIXATION 5X100MM (TROCAR) IMPLANT
SPIKE FLUID TRANSFER (MISCELLANEOUS) ×2 IMPLANT
SUT MNCRL AB 4-0 PS2 18 (SUTURE) IMPLANT
SUT SURGIDAC NAB ES-9 0 48 120 (SUTURE) ×6 IMPLANT
SUT VICRYL 0 TIES 12 18 (SUTURE) IMPLANT
SYR 20ML LL LF (SYRINGE) ×2 IMPLANT
TAPE CLOTH 4X10 WHT NS (GAUZE/BANDAGES/DRESSINGS) IMPLANT
TIP INNERVISION DETACH 40FR (MISCELLANEOUS) IMPLANT
TIP INNERVISION DETACH 50FR (MISCELLANEOUS) IMPLANT
TIP INNERVISION DETACH 56FR (MISCELLANEOUS) IMPLANT
TOWEL OR 17X26 10 PK STRL BLUE (TOWEL DISPOSABLE) ×2 IMPLANT
TOWEL OR NON WOVEN STRL DISP B (DISPOSABLE) ×2 IMPLANT
TRAY FOLEY MTR SLVR 16FR STAT (SET/KITS/TRAYS/PACK) IMPLANT
TROCAR ADV FIXATION 12X100MM (TROCAR) ×2 IMPLANT
TROCAR ADV FIXATION 5X100MM (TROCAR) ×2 IMPLANT
TROCAR XCEL NON-BLD 5MMX100MML (ENDOMECHANICALS) ×2 IMPLANT

## 2023-02-09 NOTE — Anesthesia Preprocedure Evaluation (Signed)
Anesthesia Evaluation  Patient identified by MRN, date of birth, ID band Patient awake    Reviewed: Allergy & Precautions, NPO status , Patient's Chart, lab work & pertinent test results  History of Anesthesia Complications Negative for: history of anesthetic complications  Airway Mallampati: II  TM Distance: >3 FB Neck ROM: Full    Dental  (+) Dental Advisory Given   Pulmonary neg pulmonary ROS   breath sounds clear to auscultation       Cardiovascular hypertension, Pt. on medications (-) angina  Rhythm:Regular Rate:Normal  '23 ECHO: EF 60-65%. The LV has normal function, no regional wall motion abnormalities. Grade I diastolic dysfunction (impaired relaxation).  Right ventricular systolic function is normal, mild MR '23 Stress: the study is normal. The study is low risk.   No ST deviation was noted.   Left ventricular function is normal. Nuclear stress EF: 72 %    Neuro/Psych  Headaches   Depression    TIA   GI/Hepatic Neg liver ROS, hiatal hernia,GERD  Medicated and Poorly Controlled,,  Endo/Other  negative endocrine ROS    Renal/GU negative Renal ROS     Musculoskeletal  (+) Arthritis , Rheumatoid disorders,    Abdominal   Peds  Hematology negative hematology ROS (+)   Anesthesia Other Findings   Reproductive/Obstetrics                             Anesthesia Physical Anesthesia Plan  ASA: 3  Anesthesia Plan: General   Post-op Pain Management: Tylenol PO (pre-op)*   Induction: Intravenous and Rapid sequence  PONV Risk Score and Plan: 3 and Ondansetron, Dexamethasone and Treatment may vary due to age or medical condition  Airway Management Planned: Oral ETT  Additional Equipment: None  Intra-op Plan:   Post-operative Plan: Extubation in OR  Informed Consent: I have reviewed the patients History and Physical, chart, labs and discussed the procedure including the risks,  benefits and alternatives for the proposed anesthesia with the patient or authorized representative who has indicated his/her understanding and acceptance.     Dental advisory given  Plan Discussed with: CRNA and Surgeon  Anesthesia Plan Comments:        Anesthesia Quick Evaluation

## 2023-02-09 NOTE — Interval H&P Note (Signed)
History and Physical Interval Note:  02/09/2023 8:49 AM  Janet Bailey  has presented today for surgery, with the diagnosis of TYPE 3 HIATAL HERNIA.  The various methods of treatment have been discussed with the patient and family. After consideration of risks, benefits and other options for treatment, the patient has consented to  Procedure(s): LAPAROSCOPIC HIATAL HERNIA REPAIR (N/A) as a surgical intervention.  The patient's history has been reviewed, patient examined, no change in status, stable for surgery.  I have reviewed the patient's chart and labs.  Questions were answered to the patient's satisfaction.     Valarie Merino

## 2023-02-09 NOTE — Plan of Care (Signed)

## 2023-02-09 NOTE — Transfer of Care (Signed)
Immediate Anesthesia Transfer of Care Note  Patient: Janet Bailey  Procedure(s) Performed: LAPAROSCOPIC HIATAL HERNIA REPAIR; UPPER ENDOSCOPY  Patient Location: PACU  Anesthesia Type:General  Level of Consciousness: drowsy and confused  Airway & Oxygen Therapy: Patient Spontanous Breathing and Patient connected to face mask oxygen  Post-op Assessment: Report given to RN and Post -op Vital signs reviewed and stable  Post vital signs: Reviewed and stable  Last Vitals:  Vitals Value Taken Time  BP 158/70 02/09/23 1215  Temp 36.3 C 02/09/23 1215  Pulse 71 02/09/23 1222  Resp 15 02/09/23 1222  SpO2 93 % 02/09/23 1222  Vitals shown include unvalidated device data.  Last Pain:  Vitals:   02/09/23 0751  TempSrc: Oral  PainSc: 4       Patients Stated Pain Goal: 3 (02/09/23 0751)  Complications: No notable events documented.

## 2023-02-09 NOTE — Anesthesia Procedure Notes (Signed)
Procedure Name: Intubation Date/Time: 02/09/2023 9:25 AM  Performed by: Garth Bigness, CRNAPre-anesthesia Checklist: Patient identified, Emergency Drugs available, Suction available and Patient being monitored Patient Re-evaluated:Patient Re-evaluated prior to induction Oxygen Delivery Method: Circle system utilized Preoxygenation: Pre-oxygenation with 100% oxygen Induction Type: IV induction, Cricoid Pressure applied and Rapid sequence Ventilation: Mask ventilation without difficulty Laryngoscope Size: Mac and 3 Grade View: Grade II Tube type: Oral Tube size: 7.0 mm Number of attempts: 1 Airway Equipment and Method: Stylet and Oral airway Placement Confirmation: ETT inserted through vocal cords under direct vision, positive ETCO2 and breath sounds checked- equal and bilateral Secured at: 20 cm Tube secured with: Tape Dental Injury: Teeth and Oropharynx as per pre-operative assessment

## 2023-02-09 NOTE — Op Note (Signed)
Janet Bailey  Jul 26, 1946   02/09/2023    PCP:  April Manson, NP   Surgeon: Wenda Low, MD, FACS  Asst:  Gaynelle Adu, MD, FACS  Anes:  general  Preop Dx: Large hiatal hernia with GERD Postop Dx: same  Procedure: Laparoscopic takedown of large type III mixed hiatal hernia, endoscopy per Dr. Andrey Campanile, closure of the diaphragm with 4 sutures (no pledgets), calibration of hiatus with 56 dilator and Toupet fundoplication with 4 sutures on each side Location Surgery: [facility] Complications: None noted  EBL:   minimal cc  Drains: none  Description of Procedure:  The patient was taken to OR 1 .  After anesthesia was administered and the patient was prepped  with chloroprep  and a timeout was performed.  Access to the abdomen was achieved with a 5 mm Optiview through the left upper quadrant.  2 fives were placed on that side a 5 to the left of the umbilicus 1011 were placed to the right of the umbilicus and on the flank and then 5 mm in the upper midline to put in the [facility] retractor.  This exposed the defect.  And reducing it was some mixed hernia with probably a little more of a paraesophageal component.  With the harmonic scalpel we approach this on the right side first and took down the attachments to the right crus and then brought in the hernia sac carried anteriorly.  On the patient's left side we then again stripped out the sac and were able to pull that down eventually removing it.  Penrose drain was placed around the esophagus and we were able to get up into the mediastinum and free it we had good length.  Dr. Andrey Campanile endoscope the patient to endoscopically validate the position of the EG junction below the diaphragm.  The crura on the right side were little thin but I closed it without tremendous tension using interrupted 0 Surgidac's and tie knots.  The the stomach appeared and then nicely mobilized I could have wrapped it completely because of her age and issues I elected to  put a calibration tube down which calibrated the esophagus from closure using a 56 lighted bougie and then used to calibrate our toupee wrap.  The bougie was removed and then I placed wrap around on the patient's right and secured it with 4 sutures using free ties as well as tie knots and then the other part into the partial wrap on the left side was again secured with 4 sutures.  This looked good.  I had placed Exparel block bilaterally with total of 30 cc as well as some in the [facility] retractor.  Abdomen was deflated prior to closure I did put some PMI closure 0 Vicryl's to the 2 large ports over on the right side.  All the skin was closed with 4-0 Monocryl and Dermabond.  Patient tolerated the procedure well  The patient tolerated the procedure well and was taken to the PACU in stable condition.     Matt B. Daphine Deutscher, MD, [facility], [place] [phone]

## 2023-02-09 NOTE — Anesthesia Postprocedure Evaluation (Signed)
Anesthesia Post Note  Patient: Janet Bailey  Procedure(s) Performed: LAPAROSCOPIC HIATAL HERNIA REPAIR; UPPER ENDOSCOPY     Patient location during evaluation: PACU Anesthesia Type: General Level of consciousness: oriented, patient cooperative and sedated Pain management: pain level controlled Vital Signs Assessment: post-procedure vital signs reviewed and stable Respiratory status: spontaneous breathing, nonlabored ventilation, respiratory function stable and patient connected to nasal cannula oxygen Cardiovascular status: blood pressure returned to baseline and stable Postop Assessment: no apparent nausea or vomiting Anesthetic complications: no   No notable events documented.  Last Vitals:  Vitals:   02/09/23 1315 02/09/23 1330  BP: (!) 146/71 (!) 166/70  Pulse: 70 64  Resp: 20 12  Temp: 36.5 C   SpO2: 92% 95%    Last Pain:  Vitals:   02/09/23 1330  TempSrc:   PainSc: Asleep                 Asani Mcburney,E. Valjean Ruppel

## 2023-02-10 ENCOUNTER — Encounter (HOSPITAL_COMMUNITY): Payer: Self-pay | Admitting: Surgery

## 2023-02-10 LAB — CBC
HCT: 31.9 % — ABNORMAL LOW (ref 36.0–46.0)
Hemoglobin: 10.5 g/dL — ABNORMAL LOW (ref 12.0–15.0)
MCH: 30.7 pg (ref 26.0–34.0)
MCHC: 32.9 g/dL (ref 30.0–36.0)
MCV: 93.3 fL (ref 80.0–100.0)
Platelets: 216 10*3/uL (ref 150–400)
RBC: 3.42 MIL/uL — ABNORMAL LOW (ref 3.87–5.11)
RDW: 13.4 % (ref 11.5–15.5)
WBC: 11.3 10*3/uL — ABNORMAL HIGH (ref 4.0–10.5)
nRBC: 0 % (ref 0.0–0.2)

## 2023-02-10 LAB — BASIC METABOLIC PANEL
Anion gap: 5 (ref 5–15)
BUN: 8 mg/dL (ref 8–23)
CO2: 28 mmol/L (ref 22–32)
Calcium: 8.2 mg/dL — ABNORMAL LOW (ref 8.9–10.3)
Chloride: 100 mmol/L (ref 98–111)
Creatinine, Ser: 0.37 mg/dL — ABNORMAL LOW (ref 0.44–1.00)
GFR, Estimated: >60 mL/min (ref >60)
Glucose, Bld: 137 mg/dL — ABNORMAL HIGH (ref 70–99)
Potassium: 3.2 mmol/L — ABNORMAL LOW (ref 3.5–5.1)
Sodium: 133 mmol/L — ABNORMAL LOW (ref 135–145)

## 2023-02-10 MED ORDER — PANTOPRAZOLE SODIUM 40 MG PO TBEC
40.0000 mg | DELAYED_RELEASE_TABLET | Freq: Two times a day (BID) | ORAL | Status: DC
  Administered 2023-02-10 – 2023-02-11 (×2): 40 mg via ORAL
  Filled 2023-02-10 (×2): qty 1

## 2023-02-10 MED ORDER — ONDANSETRON HCL 4 MG/2ML IJ SOLN
4.0000 mg | Freq: Four times a day (QID) | INTRAMUSCULAR | Status: DC | PRN
  Administered 2023-02-10 – 2023-02-11 (×4): 4 mg via INTRAVENOUS
  Filled 2023-02-10 (×4): qty 2

## 2023-02-10 MED ORDER — PANTOPRAZOLE SODIUM 40 MG PO TBEC: 40.0000 mg | DELAYED_RELEASE_TABLET | Freq: Every day | ORAL | Status: DC

## 2023-02-10 MED ORDER — CLONAZEPAM 0.5 MG PO TABS
0.5000 mg | ORAL_TABLET | Freq: Three times a day (TID) | ORAL | Status: DC | PRN
  Administered 2023-02-10: 0.5 mg via ORAL
  Filled 2023-02-10: qty 1

## 2023-02-10 NOTE — TOC CM/SW Note (Signed)
  Transition of Care [facility]) Screening Note   Patient Details  Name: Janet Bailey Date of Birth: [birth year]   [facility] CM/SW Contact:    Amada Jupiter, LCSW Phone Number: 02/10/2023, 1:06 PM    Transition of Care Department [facility]) has reviewed patient and no TOC needs have been identified at this time. We will continue to monitor patient advancement through interdisciplinary progression rounds. If new patient transition needs arise, please place a TOC consult.

## 2023-02-10 NOTE — Progress Notes (Signed)
Patient ID: Janet Bailey, female   DOB: 12/19/1945, 77 y.o.   MRN: [id number] [facility] Progress Note:   1 Day Post-Op   THE PLAN  Patient still weak and not ready for discharge today.  Plan discharge tomorrow  Subjective: Mental status is clear.  Complaints discomfort with swallowing pill. Objective: Vital signs in last 24 hours: Temp:  [97.4 F (36.3 C)-98.9 F (37.2 C)] 98.9 F (37.2 C) (04/10 1336) Pulse Rate:  [71-80] 77 (04/10 1336) Resp:  [17-20] 20 (04/10 1336) BP: (133-160)/(65-78) 133/67 (04/10 1336) SpO2:  [92 %-96 %] 96 % (04/10 1336)  Intake/Output from previous day: 04/09 0701 - 04/10 0700 In: 3108.8 [P.O.:400; I.V.:2258.8; IV Piggyback:450] Out: 2230 [Urine:2200; Blood:30] Intake/Output this shift: Total I/O In: 240 [P.O.:240] Out: 100 [Urine:100]  Physical Exam: Work of breathing is normal  Lab Results:  Results for orders placed or performed during the hospital encounter of 02/09/23 (from the past 48 hour(s))  CBC     Status: Abnormal   Collection Time: 02/09/23  3:25 PM  Result Value Ref Range   WBC 12.9 (H) 4.0 - 10.5 K/uL   RBC 3.75 (L) 3.87 - 5.11 MIL/uL   Hemoglobin 11.7 (L) 12.0 - 15.0 g/dL   HCT 62.0 (L) 35.5 - 97.4 %   MCV 95.2 80.0 - 100.0 fL   MCH 31.2 26.0 - 34.0 pg   MCHC 32.8 30.0 - 36.0 g/dL   RDW 16.3 84.5 - 36.4 %   Platelets 220 150 - 400 K/uL   nRBC 0.0 0.0 - 0.2 %    Comment: Performed at [facility], [address] [place], [place] [address]  Creatinine, serum     Status: None   Collection Time: 02/09/23  3:25 PM  Result Value Ref Range   Creatinine, Ser 0.45 0.44 - 1.00 mg/dL   GFR, Estimated >12 >24 mL/min    Comment: (NOTE) Calculated using the CKD-EPI Creatinine Equation (2021) Performed at [facility], [address], [place], [place] [address]   CBC     Status: Abnormal   Collection Time: 02/10/23  4:37 AM  Result Value Ref Range   WBC 11.3 (H) 4.0 - 10.5  K/uL   RBC 3.42 (L) 3.87 - 5.11 MIL/uL   Hemoglobin 10.5 (L) 12.0 - 15.0 g/dL   HCT 37.0 (L) 48.8 - 89.1 %   MCV 93.3 80.0 - 100.0 fL   MCH 30.7 26.0 - 34.0 pg   MCHC 32.9 30.0 - 36.0 g/dL   RDW 69.4 50.3 - 88.8 %   Platelets 216 150 - 400 K/uL   nRBC 0.0 0.0 - 0.2 %    Comment: Performed at [facility], [address], [place], [place] [address]  Basic metabolic panel     Status: Abnormal   Collection Time: 02/10/23  4:37 AM  Result Value Ref Range   Sodium 133 (L) 135 - 145 mmol/L   Potassium 3.2 (L) 3.5 - 5.1 mmol/L   Chloride 100 98 - 111 mmol/L   CO2 28 22 - 32 mmol/L   Glucose, Bld 137 (H) 70 - 99 mg/dL    Comment: Glucose reference range applies only to samples taken after fasting for at least 8 hours.   BUN 8 8 - 23 mg/dL   Creatinine, Ser 4.91 (L) 0.44 - 1.00 mg/dL   Calcium 8.2 (L) 8.9 - 10.3 mg/dL   GFR, Estimated >79 >15 mL/min    Comment: (NOTE) Calculated  using the CKD-EPI Creatinine Equation (2021)    Anion gap 5 5 - 15    Comment: Performed at [facility], [address], [place], [place] [address]    Radiology/Results: No results found.  Anti-infectives: Anti-infectives (From admission, onward)    Start     Dose/Rate Route Frequency Ordered Stop   02/09/23 1730  ceFAZolin (ANCEF) IVPB 2g/100 mL premix        2 g 200 mL/hr over 30 Minutes Intravenous Every 8 hours 02/09/23 1419 02/09/23 1736   02/09/23 0745  ceFAZolin (ANCEF) IVPB 2g/100 mL premix        2 g 200 mL/hr over 30 Minutes Intravenous On call to O.R. 02/09/23 0731 02/09/23 0943       Assessment/Plan: Problem List: Patient Active Problem List   Diagnosis Date Noted   History of repair of hiatal hernia 02/09/2023   Closed T12 fracture 07/27/2022   Depression/Anxiety 07/27/2022   Pelvic fracture 05/15/2020   Closed fracture of right hip 05/14/2020   Hyponatremia 05/14/2020   Hypotension 05/14/2020   Leukocytosis 05/14/2020   GERD  (gastroesophageal reflux disease) 02/26/2014   Essential hypertension, benign 02/26/2014   Abdominal pain, right upper quadrant 02/26/2014   Abdominal pain, chronic, right lower quadrant 02/26/2014    Will stay on PPI and observe on full liquid diet.  Hopeful discharge tomorrow.   1 Day Post-Op    LOS: 1 day   Matt B. Daphine Deutscher, MD, [facility] [phone] to reach the surgeon on call.    02/10/2023 2:24 PM

## 2023-02-11 LAB — CBC WITH DIFFERENTIAL/PLATELET
Abs Immature Granulocytes: 0.03 10*3/uL (ref 0.00–0.07)
Basophils Absolute: 0 10*3/uL (ref 0.0–0.1)
Basophils Relative: 0 %
Eosinophils Absolute: 0.1 10*3/uL (ref 0.0–0.5)
Eosinophils Relative: 2 %
HCT: 31.4 % — ABNORMAL LOW (ref 36.0–46.0)
Hemoglobin: 10 g/dL — ABNORMAL LOW (ref 12.0–15.0)
Immature Granulocytes: 0 %
Lymphocytes Relative: 31 %
Lymphs Abs: 2.2 10*3/uL (ref 0.7–4.0)
MCH: 30 pg (ref 26.0–34.0)
MCHC: 31.8 g/dL (ref 30.0–36.0)
MCV: 94.3 fL (ref 80.0–100.0)
Monocytes Absolute: 0.6 10*3/uL (ref 0.1–1.0)
Monocytes Relative: 8 %
Neutro Abs: 4.2 10*3/uL (ref 1.7–7.7)
Neutrophils Relative %: 59 %
Platelets: 204 10*3/uL (ref 150–400)
RBC: 3.33 MIL/uL — ABNORMAL LOW (ref 3.87–5.11)
RDW: 13.2 % (ref 11.5–15.5)
WBC: 7.1 10*3/uL (ref 4.0–10.5)
nRBC: 0 % (ref 0.0–0.2)

## 2023-02-11 MED ORDER — HYDROCODONE-ACETAMINOPHEN 5-325 MG PO TABS: 1.0000 | ORAL_TABLET | Freq: Four times a day (QID) | ORAL | 0 refills | Status: DC | PRN

## 2023-02-11 NOTE — Discharge Summary (Signed)
Physician Discharge Summary  Patient ID: Janet Bailey MRN: [id number] DOB/AGE: 1946/01/14 77 y.o.  PCP: April Manson, NP  Admit date: 02/09/2023 Discharge date: 02/11/2023  Admission Diagnoses:  large hiatal hernia with GER  Discharge Diagnoses:  same  Principal Problem:   History of repair of hiatal hernia   Surgery:  laparoscopic repair os hiatal hernia with endoscopy and Toupet fundoplication  Discharged Condition: improved  Hospital Course:   Had surgery on Tuesday.  Was taking insufficient fluid po on Wednesday and kept another day.  Was up walking and drinking full liquids better on Thursday and ready for discharge.   Consults: none  Significant Diagnostic Studies: none    Discharge Exam: Blood pressure (!) 126/57, pulse 63, temperature (!) 97.5 F (36.4 C), temperature source Oral, resp. rate 16, height 4\' 8"  (1.422 m), weight 45.8 kg, SpO2 94 %. Incisions OK  Disposition: Discharge disposition: 01-Home or Self Care       Discharge Instructions     Call MD for:  redness, tenderness, or signs of infection (pain, swelling, redness, odor or green/yellow discharge around incision site)   Complete by: As directed    Diet - low sodium heart healthy   Complete by: As directed    Discharge instructions   Complete by: As directed    Full liquids for one week then pureed foods for 3 weeks Warm liquids will go down better than cold liquids   Increase activity slowly   Complete by: As directed       Allergies as of 02/11/2023   No Known Allergies      Medication List     TAKE these medications    alum & mag hydroxide-simeth 200-200-20 MG/5ML suspension Commonly known as: MAALOX/MYLANTA Take 15 mLs by mouth every 6 (six) hours as needed for indigestion or heartburn.   B-12 PO Take 1 tablet by mouth daily.   Calcium Carbonate-Vitamin D 600-200 MG-UNIT Tabs Take 1 tablet by mouth daily.   cholecalciferol 1000 units tablet Commonly known as:  VITAMIN D Take 1,000 Units by mouth 2 (two) times daily.   citalopram 20 MG tablet Commonly known as: CELEXA Take 10 mg by mouth daily.   clonazePAM 0.5 MG tablet Commonly known as: KLONOPIN Take 0.5 mg by mouth 2 (two) times daily as needed for anxiety.   diazepam 10 MG tablet Commonly known as: VALIUM Take 10 mg by mouth at bedtime as needed for sleep.   HYDROcodone-acetaminophen 5-325 MG tablet Commonly known as: NORCO/VICODIN Take 1 tablet by mouth every 6 (six) hours as needed for moderate pain.   isosorbide dinitrate 30 MG tablet Commonly known as: ISORDIL Take 0.5 tablets (15 mg total) by mouth 2 (two) times daily.   lidocaine 5 % Commonly known as: LIDODERM Place 1 patch onto the skin daily as needed (pain).   lisinopril 20 MG tablet Commonly known as: ZESTRIL Take 20 mg by mouth in the morning and at bedtime.   losartan 100 MG tablet Commonly known as: COZAAR Take 1 tablet (100 mg total) by mouth daily. Take 1 Tablet Daily.   meclizine 25 MG tablet Commonly known as: ANTIVERT Take 25 mg by mouth 3 (three) times daily as needed for dizziness.   omeprazole 20 MG capsule Commonly known as: PRILOSEC Take 20 mg by mouth in the morning and at bedtime.   pantoprazole 40 MG tablet Commonly known as: PROTONIX Take 1 tablet (40 mg total) by mouth 2 (two) times daily.  Follow-up Information     Luretha Murphy, MD Follow up.   Specialty: General Surgery Contact information: [address] [place] [address] [place] [phone]                 Signed: Valarie Merino 02/11/2023, 8:28 AM

## 2023-02-11 NOTE — Care Management Important Message (Signed)
Important Message  Patient Details IM Letter given Name: Janet Bailey MRN: [id number] Date of Birth: 09/04/1946   Medicare Important Message Given:  Yes     Caren Macadam 02/11/2023, 9:31 AM

## 2023-02-11 NOTE — Plan of Care (Signed)
Patient is stable for discharge. Discharge instructions have been given. Patient is discharged to home with daughter.  

## 2023-02-11 NOTE — Plan of Care (Addendum)
A/ox4. Patient ambulate the hallway over night with daughter. Patient still having pain with intermittent nausea. Received pain meds three times over night and zofran once. Patient 02 was in the 80's this morning. [id number] placed and incentive spirometer given and deep breathing and coughing encouraged. 02 quickly jumped up to mid 90's. Will recheck 02 and wean off oxygen when can.    Problem: Education: Goal: Knowledge of General Education information will improve Description: Including pain rating scale, medication(s)/side effects and non-pharmacologic comfort measures Outcome: Progressing   Problem: Health Behavior/Discharge Planning: Goal: Ability to manage health-related needs will improve Outcome: Progressing   Problem: Clinical Measurements: Goal: Ability to maintain clinical measurements within normal limits will improve Outcome: Progressing Goal: Will remain free from infection Outcome: Progressing Goal: Diagnostic test results will improve Outcome: Progressing Goal: Respiratory complications will improve Outcome: Progressing Goal: Cardiovascular complication will be avoided Outcome: Progressing   Problem: Activity: Goal: Risk for activity intolerance will decrease Outcome: Progressing   Problem: Nutrition: Goal: Adequate nutrition will be maintained Outcome: Progressing   Problem: Elimination: Goal: Will not experience complications related to bowel motility Outcome: Progressing Goal: Will not experience complications related to urinary retention Outcome: Progressing   Problem: Pain Managment: Goal: General experience of comfort will improve Outcome: Progressing

## 2023-02-16 DIAGNOSIS — R300 Dysuria: Secondary | ICD-10-CM | POA: Diagnosis not present

## 2023-02-23 DIAGNOSIS — M25512 Pain in left shoulder: Secondary | ICD-10-CM | POA: Diagnosis not present

## 2023-03-01 DIAGNOSIS — R079 Chest pain, unspecified: Secondary | ICD-10-CM | POA: Diagnosis not present

## 2023-03-01 DIAGNOSIS — K449 Diaphragmatic hernia without obstruction or gangrene: Secondary | ICD-10-CM | POA: Diagnosis not present

## 2023-03-01 DIAGNOSIS — K219 Gastro-esophageal reflux disease without esophagitis: Secondary | ICD-10-CM | POA: Diagnosis not present

## 2023-03-01 DIAGNOSIS — Z6821 Body mass index (BMI) 21.0-21.9, adult: Secondary | ICD-10-CM | POA: Diagnosis not present

## 2023-03-01 DIAGNOSIS — D72829 Elevated white blood cell count, unspecified: Secondary | ICD-10-CM | POA: Diagnosis not present

## 2023-03-01 DIAGNOSIS — R531 Weakness: Secondary | ICD-10-CM | POA: Diagnosis not present

## 2023-03-01 DIAGNOSIS — I493 Ventricular premature depolarization: Secondary | ICD-10-CM | POA: Diagnosis not present

## 2023-03-01 DIAGNOSIS — I10 Essential (primary) hypertension: Secondary | ICD-10-CM | POA: Diagnosis not present

## 2023-03-01 DIAGNOSIS — R001 Bradycardia, unspecified: Secondary | ICD-10-CM | POA: Diagnosis not present

## 2023-03-01 DIAGNOSIS — E871 Hypo-osmolality and hyponatremia: Secondary | ICD-10-CM | POA: Diagnosis not present

## 2023-03-01 DIAGNOSIS — J984 Other disorders of lung: Secondary | ICD-10-CM | POA: Diagnosis not present

## 2023-03-01 DIAGNOSIS — Z9049 Acquired absence of other specified parts of digestive tract: Secondary | ICD-10-CM | POA: Diagnosis not present

## 2023-03-01 DIAGNOSIS — Z86010 Personal history of colonic polyps: Secondary | ICD-10-CM | POA: Diagnosis not present

## 2023-03-01 DIAGNOSIS — R0602 Shortness of breath: Secondary | ICD-10-CM | POA: Diagnosis not present

## 2023-03-01 DIAGNOSIS — F32A Depression, unspecified: Secondary | ICD-10-CM | POA: Diagnosis not present

## 2023-03-01 DIAGNOSIS — Z87440 Personal history of urinary (tract) infections: Secondary | ICD-10-CM | POA: Diagnosis not present

## 2023-03-01 DIAGNOSIS — R0609 Other forms of dyspnea: Secondary | ICD-10-CM | POA: Diagnosis not present

## 2023-03-01 DIAGNOSIS — E440 Moderate protein-calorie malnutrition: Secondary | ICD-10-CM | POA: Diagnosis not present

## 2023-03-02 DIAGNOSIS — I10 Essential (primary) hypertension: Secondary | ICD-10-CM | POA: Diagnosis not present

## 2023-03-02 DIAGNOSIS — I080 Rheumatic disorders of both mitral and aortic valves: Secondary | ICD-10-CM | POA: Diagnosis not present

## 2023-03-02 DIAGNOSIS — F32A Depression, unspecified: Secondary | ICD-10-CM | POA: Diagnosis not present

## 2023-03-02 DIAGNOSIS — I493 Ventricular premature depolarization: Secondary | ICD-10-CM | POA: Diagnosis not present

## 2023-08-22 ENCOUNTER — Ambulatory Visit (HOSPITAL_COMMUNITY)
Admission: EM | Admit: 2023-08-22 | Discharge: 2023-08-22 | Disposition: A | Discharge status: Home / Self Care | Payer: Medicare Other

## 2023-08-22 DIAGNOSIS — F331 Major depressive disorder, recurrent, moderate: Secondary | ICD-10-CM

## 2023-08-22 NOTE — Discharge Instructions (Signed)

## 2023-08-22 NOTE — Progress Notes (Signed)
   08/22/23 2200  [facility] Triage Screening (Walk-ins at [facility] only)  How Did You Hear About [place]? Family/Friend  What Is the Reason for Your Visit/Call Today? Pt presents to [facility] voluntarily, accompanied by her daughter with complaint of SI, depression and anxiety. Pt reports arguing with her daughter this evening and out of anger she thought about jumping out of the window. Pt states " I knew I couldn't do it, I can't even break the window". Pt reports that she acted out of impulse and just could not take the arguing anymore. Pt admits that she said she just wanted to be gone. Pt reports grieving her husband and selling their home and she has had difficulty managing her emotions the last two years. Pt is observed very tearful and states " I can't do anything right and my daughter thinks it's always me". Pt denies prior suicide attempts and impatient hospitalizations. Pt is followed by Roe Rutherford at [facility]. Pt currently denies HI,AVH and substance/alcohol use.  How Long Has This Been Causing You Problems? > than 6 months  Have You Recently Had Any Thoughts About Hurting Yourself? Yes  How long ago did you have thoughts about hurting yourself? currently  Are You Planning to Commit Suicide/Harm Yourself At This time? No  Have you Recently Had Thoughts About Hurting Someone Karolee Ohs? No  Are You Planning To Harm Someone At This Time? No  Are you currently experiencing any auditory, visual or other hallucinations? No  Have You Used Any Alcohol or Drugs in the Past 24 Hours? No  Do you have any current medical co-morbidities that require immediate attention? No  Clinician description of patient physical appearance/behavior: pt is very tearful, but is cooperative  What Do You Feel Would Help You the Most Today? Treatment for Depression or other mood problem;Stress Management  If access to [facility] was not available, would you have sought care in the Emergency Department?  Yes  Determination of Need Urgent (48 hours)  Options For Referral Other: Comment;BH Urgent Care;Outpatient Therapy;Medication Management

## 2023-08-23 NOTE — ED Provider Notes (Signed)
Behavioral Health Urgent Care Medical Screening Exam  Patient Name: Janet Bailey MRN: [id number] Date of Evaluation: 08/22/2023 Chief Complaint:  argument with he daughter  Diagnosis:  Final diagnoses:  MDD (major depressive disorder), recurrent episode, moderate (HCC)    History of Present illness: Janet Bailey is a 77 y.o. female.presenting with her daughter Ranelle Oyster today at [facility]  for worsening anxiety symptoms.   Nurse practitioner assessed patient face to face and reviewed her chart. Patient is alert oriented x3, very tearful, speech is clear and coherent, thought process is logical and goal directed, denies any suicidal ideation, homicidal ideation, auditory or visual hallucinations.   Patient reports that she has been feeling dressed and anxious. Patient reports that she has been arguing with her daughter a lot. Patient reports that she has been living with her daughter, son-in-law and grandson since April after selling her home. Patient reports that her husband dies two years ago. Patient reports that she has had several health problems shingles, pacemaker, compression fracture of her vertebra and hernia surgery. Patient reports that she feels like she cannot do anything right. Patient reports that her daughter is a Engineer, civil (consulting) and works 12 hour shift and she gets lonely being home by herself. Patient reports that she tries to keep busy by cleaning, doing dishes or laundry for the family but will do too much sometimes and it makes her back hurt. Patient's Ranelle Oyster that patient has had numerous medications trials during the years but will only stay on medications (zoloft, paxil, prozac, elavil, wellbutrin, lexapro, zyprexa) for a short period of time or will experience side effects of the medications.  Patient's daughter wants a medication to work for patient, but has not been giving medication a long enough trial before she stops taking the medication. Patient is being followed  by Rhina Brackett at [facility] and has a therapist at [place]. Tonight while at home patient reports that she ran towards the window and hit the window with her hand. Patient reports that she was not trying to jump out the of the window. Patient denies any SI/HI/AVH. Recommended that patient and family explore independent living facility for patient or companion for patient during the day.   Patient does not meet inpatient criteria and will be discharged home with her family.  Patient will follow up with outpatient mental health provider Asencion Partridge at [facility] on Monday 08/22/21.  Flowsheet Row ED from 08/22/2023 in [facility] Admission (Discharged) from 02/09/2023 in [facility] 3 [place] General Surgery Pre-Admission Testing 60 from 02/05/2023 in [facility]  C-SSRS RISK CATEGORY Low Risk No Risk No Risk       Psychiatric Specialty Exam  Presentation  General Appearance:Casual  Eye Contact:Fair  Speech:Clear and Coherent  Speech Volume:Normal  Handedness:Right   Mood and Affect  Mood: Depressed  Affect: Tearful   Thought Process  Thought Processes: Coherent  Descriptions of Associations:Intact  Orientation:Full (Time, Place and Person)  Thought Content:WDL    Hallucinations:None  Ideas of Reference:None  Suicidal Thoughts:No  Homicidal Thoughts:No   Sensorium  Memory: Immediate Fair; Remote Fair; Recent Fair  Judgment: Fair  Insight: Fair   Art therapist  Concentration: Fair  Attention Span: Fair  Recall: [employer] of Knowledge: Fair  Language: Good   Psychomotor Activity  Psychomotor Activity: Normal   Assets  Assets: Communication Skills; Desire for Improvement; Financial Resources/Insurance; Housing; Resilience   Sleep  Sleep: Fair  Number of  hours:  5   Physical Exam: Physical Exam HENT:     Head: Normocephalic and  atraumatic.     Nose: Nose normal.  Eyes:     Pupils: Pupils are equal, round, and reactive to light.  Abdominal:     General: Abdomen is flat.  Musculoskeletal:        General: Normal range of motion.  Skin:    General: Skin is warm.  Neurological:     General: No focal deficit present.     Mental Status: She is alert.  Psychiatric:        Attention and Perception: Attention normal.        Mood and Affect: Mood is anxious.        Speech: Speech normal.        Behavior: Behavior normal.        Thought Content: Thought content normal.        Cognition and Memory: Cognition normal.        Judgment: Judgment is impulsive.   Review of Systems  Constitutional: Negative.   HENT: Negative.    Eyes: Negative.   Respiratory: Negative.    Cardiovascular: Negative.   Genitourinary: Negative.   Musculoskeletal: Negative.   Skin: Negative.   Neurological: Negative.   Endo/Heme/Allergies: Negative.   Psychiatric/Behavioral:  Positive for depression.    Blood pressure 124/79, pulse 73, temperature 98.7 F (37.1 C), temperature source Oral, resp. rate 18, SpO2 96%. There is no height or weight on file to calculate BMI.  Musculoskeletal: Strength & Muscle Tone: within normal limits Gait & Station: normal Patient leans: N/A   [facility] MSE Discharge Disposition for Follow up and Recommendations: Based on my evaluation the patient does not appear to have an emergency medical condition and can be discharged with resources and follow up care in outpatient services for Medication Management and Individual Therapy   Jasper Riling, NP 08/23/2023, 1:14 AM

## 2023-10-11 ENCOUNTER — Ambulatory Visit: Payer: Self-pay | Admitting: Adult Health

## 2023-11-15 ENCOUNTER — Ambulatory Visit (INDEPENDENT_AMBULATORY_CARE_PROVIDER_SITE_OTHER): Payer: Medicare Other | Admitting: Adult Health

## 2023-11-15 ENCOUNTER — Encounter: Payer: Self-pay | Admitting: Adult Health

## 2023-11-15 VITALS — BP 153/81 | HR 105 | Ht <= 58 in | Wt 106.0 lb

## 2023-11-15 DIAGNOSIS — F331 Major depressive disorder, recurrent, moderate: Secondary | ICD-10-CM

## 2023-11-15 DIAGNOSIS — F411 Generalized anxiety disorder: Secondary | ICD-10-CM | POA: Diagnosis not present

## 2023-11-15 DIAGNOSIS — G4700 Insomnia, unspecified: Secondary | ICD-10-CM

## 2023-11-15 MED ORDER — FLUOXETINE HCL 20 MG PO CAPS
20.0000 mg | ORAL_CAPSULE | Freq: Every day | ORAL | 2 refills | Status: DC
Start: 2023-11-15 — End: 2024-02-10

## 2023-11-15 NOTE — Progress Notes (Signed)
 [facility] MD/PA/NP Initial Note  11/15/2023 3:58 PM Janet Bailey  MRN:  [id number]  Chief Complaint:   HPI:   Patient seen today for initial psychiatric evaluation.   Accompanied by daughter.  Patient referred by PCP - awaiting documentation.  Describes mood today as not the best. Pleasant. Reports tearfulness. Mood symptoms - reports depression - down and out. Reports decreased interest and motivation. Reports anxiety - racing thoughts. Denies irritability. Denies panic attacks. Reports some worry, rumination and over thinking. Reports a low mood. Reports multiple  changes since 2022 - losing her husband, health issues, physical issues and recently moving to an independent nursing facility. Reports she has taken various psychiatric medications over the years with variable results. She is currently taking Cymbalta 20mg  daily and Seroquel  50mg  at bedtime. She does not feel like current medication regimen is working to manage mood symptoms. She is willing to explore other medication options and consider therapy with all of the life changes and adjustments over the past few years. Taking medications as prescribed.  Energy levels lower. Active, does not have a regular exercise routine.   Reports she does not enjoy usual interests and activities. Widowed. Lives alone - independent living. Spending time with family. Appetite adequate. Weight gain 106 pounds - reports a loss after recent surgery. Reports awakening during the night - does go back to sleep. Reports some daytime napping. Averages 7 hours. Reports difficulties with focus and concentration at times. Completing tasks. Managing aspects of household - cleaning and vacuuming. Denies SI or HI.  Denies AH or VH. Denies self harm.  Denies substance use.  Previous medication trials: Lexapro, Prozac , Elavil, Effexor, Cymbalta, Remeron, Zoloft, Depakote, Seroquel , Trintellix, Valium , Wellbutrin, Clonazepam , Olanzapine.   Visit  Diagnosis:    ICD-10-CM   1. Generalized anxiety disorder  F41.1 FLUoxetine  (PROZAC ) 20 MG capsule    2. Major depressive disorder, recurrent episode, moderate (HCC)  F33.1 FLUoxetine  (PROZAC ) 20 MG capsule      Past Psychiatric History:   Past Medical History:  Past Medical History:  Diagnosis Date   Arthritis    Colon polyps    Depression    Dysrhythmia    GERD (gastroesophageal reflux disease)    Headache    Hiatal hernia    Hypertension    TIA (transient ischemic attack)     Past Surgical History:  Procedure Laterality Date   ABDOMINAL HYSTERECTOMY     cataract surgery     CHOLECYSTECTOMY     HIATAL HERNIA REPAIR N/A 02/09/2023   Procedure: LAPAROSCOPIC HIATAL HERNIA REPAIR; UPPER ENDOSCOPY;  Surgeon: Gladis Cough, MD;  Location: [facility];  Service: General;  Laterality: N/A;   INGUINAL HERNIA REPAIR     tension free transvaginal tape procedure      Family Psychiatric History: Denies any family history of mental illness.   Family History:  Family History  Problem Relation Age of Onset   Aneurysm Mother    Heart disease Father    Arrhythmia Sister    Heart disease Sister    Colon cancer Paternal Uncle    Diabetes Daughter        One has diabetes age 72. Other in good health    Social History:  Social History   Socioeconomic History   Marital status: Widowed    Spouse name: Not on file   Number of children: 2   Years of education: Not on file   Highest education level: Not on file  Occupational History   Occupation:  retired  Tobacco Use   Smoking status: Never   Smokeless tobacco: Never  Vaping Use   Vaping status: Never Used  Substance and Sexual Activity   Alcohol use: No   Drug use: No   Sexual activity: Not Currently    Comment: 1st intercourse 44 yo-1 partner  Other Topics Concern   Not on file  Social History Narrative   ** Merged History Encounter **       Social Drivers of Health   Financial Resource Strain: Low Risk  (10/11/2023)    Received from [facility]   Overall Financial Resource Strain (CARDIA)    Difficulty of Paying Living Expenses: Not hard at all  Food Insecurity: No Food Insecurity (10/11/2023)   Received from [facility]   Hunger Vital Sign    Worried About Running Out of Food in the Last Year: Never true    Ran Out of Food in the Last Year: Never true  Transportation Needs: No Transportation Needs (10/11/2023)   [facility] - Transportation    Lack of Transportation (Medical): No    Lack of Transportation (Non-Medical): No  Recent Concern: Transportation Needs - Unmet Transportation Needs (09/07/2023)   Received from [facility]   PRAPARE - Transportation    Lack of Transportation (Medical): No    Lack of Transportation (Non-Medical): Yes  Physical Activity: Insufficiently Active (10/11/2023)   Received from [facility]   Exercise Vital Sign    Days of Exercise per Week: 2 days    Minutes of Exercise per Session: 30 min  Stress: No Stress Concern Present (10/11/2023)   Received from [facility] - Occupational Stress Questionnaire    Feeling of Stress : Not at all  Social Connections: Moderately Integrated (10/11/2023)   Received from [facility]   Social Network    How would you rate your social network (family, work, friends)?: Adequate participation with social networks    Allergies: No Known Allergies  Metabolic Disorder Labs: No results found for: HGBA1C, MPG No results found for: PROLACTIN Lab Results  Component Value Date   CHOL 145 07/28/2022   TRIG 243 (H) 07/28/2022   HDL 25 (L) 07/28/2022   CHOLHDL 5.8 07/28/2022   VLDL 49 (H) 07/28/2022   LDLCALC 71 07/28/2022   No results found for: TSH  Therapeutic Level Labs: No results found for: LITHIUM No results found for: VALPROATE No results found for: CBMZ  Current Medications: Current Outpatient Medications  Medication Sig  Dispense Refill   FLUoxetine  (PROZAC ) 20 MG capsule Take 1 capsule (20 mg total) by mouth daily. 30 capsule 2   alum & mag hydroxide-simeth (MAALOX/MYLANTA) 200-200-20 MG/5ML suspension Take 15 mLs by mouth every 6 (six) hours as needed for indigestion or heartburn. 355 mL 0   Calcium  Carbonate-Vitamin D  600-200 MG-UNIT TABS Take 1 tablet by mouth daily.     cholecalciferol  (VITAMIN D ) 1000 UNITS tablet Take 1,000 Units by mouth 2 (two) times daily.     citalopram (CELEXA) 20 MG tablet Take 10 mg by mouth daily.     clonazePAM  (KLONOPIN ) 0.5 MG tablet Take 0.5 mg by mouth 2 (two) times daily as needed for anxiety.     Cyanocobalamin  (B-12 PO) Take 1 tablet by mouth daily.     diazepam  (VALIUM ) 10 MG tablet Take 10 mg by mouth at bedtime as needed for sleep.     HYDROcodone -acetaminophen  (NORCO/VICODIN) 5-325 MG tablet Take 1  tablet by mouth every 6 (six) hours as needed for moderate pain. 15 tablet 0   isosorbide  dinitrate (ISORDIL ) 30 MG tablet Take 0.5 tablets (15 mg total) by mouth 2 (two) times daily. (Patient not taking: Reported on 02/03/2023) 30 tablet 1   lidocaine  (LIDODERM ) 5 % Place 1 patch onto the skin daily as needed (pain).     lisinopril  (ZESTRIL ) 20 MG tablet Take 20 mg by mouth in the morning and at bedtime.     losartan  (COZAAR ) 100 MG tablet Take 1 tablet (100 mg total) by mouth daily. Take 1 Tablet Daily. (Patient not taking: Reported on 02/03/2023) 90 tablet 2   meclizine (ANTIVERT) 25 MG tablet Take 25 mg by mouth 3 (three) times daily as needed for dizziness.     omeprazole  (PRILOSEC) 20 MG capsule Take 20 mg by mouth in the morning and at bedtime.     pantoprazole  (PROTONIX ) 40 MG tablet Take 1 tablet (40 mg total) by mouth 2 (two) times daily. (Patient not taking: Reported on 02/03/2023) 60 tablet 0   No current facility-administered medications for this visit.    Medication Side Effects: none  Orders placed this visit:  No orders of the defined types were placed in this  encounter.   Psychiatric Specialty Exam:  Review of Systems  Musculoskeletal:  Negative for gait problem.  Neurological:  Negative for tremors.  Psychiatric/Behavioral:         Please refer to HPI    There were no vitals taken for this visit.There is no height or weight on file to calculate BMI.  General Appearance: Casual and Neat  Eye Contact:  Good  Speech:  Clear and Coherent and Normal Rate  Volume:  Normal  Mood:  Anxious and Depressed  Affect:  Appropriate and Congruent  Thought Process:  Coherent and Descriptions of Associations: Intact  Orientation:  Full (Time, Place, and Person)  Thought Content: Logical   Suicidal Thoughts:  No  Homicidal Thoughts:  No  Memory:  WNL  Judgement:  Good  Insight:  Good  Psychomotor Activity:  Normal  Concentration:  Concentration: Good and Attention Span: Good  Recall:  Good  Fund of Knowledge: Good  Language: Good  Assets:  Communication Skills Desire for Improvement Financial Resources/Insurance Housing Intimacy Leisure Time Physical Health Resilience Social Support Talents/Skills Transportation Vocational/Educational  ADL's:  Intact  Cognition: WNL  Prognosis:  Good   Screenings:  Flowsheet Row Admission (Discharged) from 02/09/2023 in [facility] 3 [place] General Surgery Pre-Admission Testing 60 from 02/05/2023 in [facility] ED to Hosp-Admission (Discharged) from 07/27/2022 in [facility] MEDICAL SURGICAL UNIT  C-SSRS RISK CATEGORY No Risk No Risk No Risk       Receiving Psychotherapy: No   Treatment Plan/Recommendations:   Add Prozac  20mg  daily - has tried previously  Seroquel  50mg  at hs - discussed taper - may also try melatonin  D/C Cymbalta 20mg  daily - may be too activating    Angeline LOISE Sayers, NP

## 2023-12-13 ENCOUNTER — Telehealth: Payer: Self-pay | Admitting: Adult Health

## 2023-12-13 ENCOUNTER — Telehealth (INDEPENDENT_AMBULATORY_CARE_PROVIDER_SITE_OTHER): Payer: Medicare Other | Admitting: Adult Health

## 2023-12-13 ENCOUNTER — Encounter: Payer: Self-pay | Admitting: Adult Health

## 2023-12-13 DIAGNOSIS — G4700 Insomnia, unspecified: Secondary | ICD-10-CM | POA: Diagnosis not present

## 2023-12-13 DIAGNOSIS — F411 Generalized anxiety disorder: Secondary | ICD-10-CM | POA: Diagnosis not present

## 2023-12-13 DIAGNOSIS — F331 Major depressive disorder, recurrent, moderate: Secondary | ICD-10-CM

## 2023-12-13 NOTE — Telephone Encounter (Addendum)
 Pt had a visit with you earlier today. She called back asking for something else for anxiety. She is prescribed clonazepam  0.5 mg TID by PCP. She has previously been on Valium  and lorazepam. She sounded anxious and SOB. She said she is trying to adjust to a new living situation and her husband passing away and she gets PA. I called her back and she seemed slightly calmer. States clonazepam  hurts her stomach. She said she thought she was okay now.

## 2023-12-13 NOTE — Progress Notes (Signed)
 [name] [id number] 1946/08/15 78 y.o.  Subjective:   Patient ID:  Janet Bailey is a 78 y.o. (DOB 10-Sep-1946) female.  Chief Complaint: No chief complaint on file.   HPI Lonya Schrenk presents to the office today for follow-up of MDD, GAD and insomnia.  Describes mood today as "not the best". Pleasant. Reports tearfulness. Mood symptoms - reports depression - "sorta down". Stating "I'm trying to get used to the place I live". Reports decreased interest and motivation - "it's lower". Stating "some days are good and some days are not". Reports anxiety attacks - "it varies". Reports having bad days some days. Utilizing breathing techniques.  Denies irritability - "sometimes I get upset". Stating "I'm just trying to get used to things". Denies panic attacks. Reports worry, rumination and over thinking - "sometimes I do that, but I always have.  Reports a low mood. Feels like her head is "clearer" since restarting the Prozac . Reports multiple changes with living situation - "trying to adjust to a new place". Taking medications as prescribed.  Energy levels vary. Active, does not have a regular exercise routine. Reports walking up and down. Reports she does not enjoy usual interests and activities. Widowed. Lives alone - independent living. Has met some residents nad participates in some activities. Spending time with family. Appetite adequate. Weight stable - 106 pounds - reports a loss with IBS. Reports sleeping better some nights than others. Averages 7 hours of broken sleep. Denies daytime napping.  Reports difficulties with focus and concentration - trying to read and gets distracted. Reports difficulties getting started on things. Completing tasks - cooking and cleaning. Managing aspects of household. Denies SI or HI.  Denies AH or VH. Denies self harm.  Denies substance use.  Previous medication trials: Lexapro, Prozac , Elavil, Effexor, Cymbalta, Remeron, Zoloft,  Depakote, Seroquel, Trintellix, Valium , Wellbutrin, Clonazepam , Olanzapine.    Flowsheet Row Admission (Discharged) from 02/09/2023 in [facility] [place] [facility] Pre-Admission Testing 60 from 02/05/2023 in [facility] to Hosp-Admission (Discharged) from 07/27/2022 in [facility]  C-SSRS RISK CATEGORY No Risk No Risk No Risk        Review of Systems:  Review of Systems  Musculoskeletal:  Negative for gait problem.  Neurological:  Negative for tremors.  Psychiatric/Behavioral:         Please refer to HPI    Medications: I have reviewed the patient's current medications.  Current Outpatient Medications  Medication Sig Dispense Refill   alum & mag hydroxide-simeth (MAALOX/MYLANTA) 200-200-20 MG/5ML suspension Take 15 mLs by mouth every 6 (six) hours as needed for indigestion or heartburn. 355 mL 0   Calcium  Carbonate-Vitamin D  600-200 MG-UNIT TABS Take 1 tablet by mouth daily.     cholecalciferol  (VITAMIN D ) 1000 UNITS tablet Take 1,000 Units by mouth 2 (two) times daily.     Cyanocobalamin  (B-12 PO) Take 1 tablet by mouth daily.     FLUoxetine  (PROZAC ) 20 MG capsule Take 1 capsule (20 mg total) by mouth daily. 30 capsule 2   HYDROcodone -acetaminophen  (NORCO/VICODIN) 5-325 MG tablet Take 1 tablet by mouth every 6 (six) hours as needed for moderate pain. 15 tablet 0   isosorbide  dinitrate (ISORDIL ) 30 MG tablet Take 0.5 tablets (15 mg total) by mouth 2 (two) times daily. (Patient not taking: Reported on 02/03/2023) 30 tablet 1   lidocaine  (LIDODERM ) 5 % Place 1 patch onto the skin daily as needed (pain).     lisinopril  (ZESTRIL ) 20  MG tablet Take 20 mg by mouth in the morning and at bedtime.     losartan  (COZAAR ) 100 MG tablet Take 1 tablet (100 mg total) by mouth daily. Take 1 Tablet Daily. (Patient not taking: Reported on 02/03/2023) 90 tablet 2   meclizine (ANTIVERT) 25 MG tablet Take 25 mg by mouth 3 (three) times daily as needed  for dizziness.     omeprazole  (PRILOSEC) 20 MG capsule Take 20 mg by mouth in the morning and at bedtime.     pantoprazole  (PROTONIX ) 40 MG tablet Take 1 tablet (40 mg total) by mouth 2 (two) times daily. (Patient not taking: Reported on 02/03/2023) 60 tablet 0   No current facility-administered medications for this visit.    Medication Side Effects: None  Allergies:  Allergies  Allergen Reactions   Amoxicillin-Pot Clavulanate Diarrhea    Past Medical History:  Diagnosis Date   Arthritis    Colon polyps    Depression    Dysrhythmia    GERD (gastroesophageal reflux disease)    Headache    Hiatal hernia    Hypertension    TIA (transient ischemic attack)     Past Medical History, Surgical history, Social history, and Family history were reviewed and updated as appropriate.   Please see review of systems for further details on the patient's review from today.   Objective:   Physical Exam:  There were no vitals taken for this visit.  Physical Exam Constitutional:      General: She is not in acute distress. Musculoskeletal:        General: No deformity.  Neurological:     Mental Status: She is alert and oriented to person, place, and time.     Coordination: Coordination normal.  Psychiatric:        Attention and Perception: Attention and perception normal. She does not perceive auditory or visual hallucinations.        Mood and Affect: Affect is not labile, blunt, angry or inappropriate.        Speech: Speech normal.        Behavior: Behavior normal.        Thought Content: Thought content normal. Thought content is not paranoid or delusional. Thought content does not include homicidal or suicidal ideation. Thought content does not include homicidal or suicidal plan.        Cognition and Memory: Cognition and memory normal.        Judgment: Judgment normal.     Comments: Insight intact     Lab Review:     Component Value Date/Time   NA 133 (L) 02/10/2023 0437   K  3.2 (L) 02/10/2023 0437   CL 100 02/10/2023 0437   CO2 28 02/10/2023 0437   GLUCOSE 137 (H) 02/10/2023 0437   BUN 8 02/10/2023 0437   CREATININE 0.37 (L) 02/10/2023 0437   CREATININE 0.55 02/26/2014 1509   CALCIUM  8.2 (L) 02/10/2023 0437   PROT 5.3 (L) 07/29/2022 0857   ALBUMIN  3.0 (L) 07/29/2022 0857   AST 13 (L) 07/29/2022 0857   ALT 17 07/29/2022 0857   ALKPHOS 73 07/29/2022 0857   BILITOT 0.6 07/29/2022 0857   GFRNONAA >60 02/10/2023 0437       Component Value Date/Time   WBC 7.1 02/11/2023 0503   RBC 3.33 (L) 02/11/2023 0503   HGB 10.0 (L) 02/11/2023 0503   HCT 31.4 (L) 02/11/2023 0503   PLT 204 02/11/2023 0503   MCV 94.3 02/11/2023 0503   MCH 30.0 02/11/2023  0503   MCHC 31.8 02/11/2023 0503   RDW 13.2 02/11/2023 0503   LYMPHSABS 2.2 02/11/2023 0503   MONOABS 0.6 02/11/2023 0503   EOSABS 0.1 02/11/2023 0503   BASOSABS 0.0 02/11/2023 0503    No results found for: "POCLITH", "LITHIUM"   No results found for: "PHENYTOIN", "PHENOBARB", "VALPROATE", "CBMZ"   .res Assessment: Plan:    Treatment Plan/Recommendations:   Continue Prozac  20mg  daily   Using Melatonin as needed for sleep  RTC 4 weeks  25 minutes spent dedicated to the care of this patient on the date of this encounter to include pre-visit review of records, ordering of medication, post visit documentation, and face-to-face time with the patient discussing depression, anxiety and insomnia. Discussed continuing current medication regimen.  There are no diagnoses linked to this encounter.   Please see After Visit Summary for patient specific instructions.  Future Appointments  Date Time Provider Department Center  12/13/2023  2:00 PM Damyn Weitzel Nattalie, NP [facility] None  01/03/2024 10:00 AM Kelleen Patee, LCSW [facility] None    No orders of the defined types were placed in this encounter.   -------------------------------

## 2023-12-13 NOTE — Telephone Encounter (Signed)
 Janet Bailey called at 3:44 wanting to talk with Leward Record again. She had her appt at 2pm but she said she had another question.  She seemed very panicked. Breathing was heavy, almost a struggle.

## 2023-12-14 NOTE — Telephone Encounter (Signed)
Per discussion with Almira Coaster, no changes will be made with regards to benzos. Patient's anxiety is an adjustment reaction and patient needs to establish a schedule. Patient notified.

## 2023-12-17 ENCOUNTER — Telehealth: Payer: Self-pay | Admitting: Adult Health

## 2023-12-17 NOTE — Telephone Encounter (Signed)
Anxiety attacks every day. She takes medication as prescribed. She says no new stressors.  Very anxious on the phone. She wakes up, eats breakfast then takes her medication and then the anxiety attacks begin to happen.  She tries to walk them off. She reports that her anxiety has increased since being on Prozac 20 mg; she reports that sometimes when she takes it it makes her heart feel as though its beating faster.. She began taking Prozac 1/13. She d/c Cymbalta 20 mg and Seroquel 50 mg. She says that the Cymbalta made her head fuzzy.  She said that she was doing fine on the Prozac the first month but now does not seem as though its helping. She has had a difficult time cleaning because she has not felt like it, but she does shower and eat. She states that she has been doing some breathing exercises but she does not know what to do to make the attacks stop. She's not sure if she should go back on Cymbalta.. She reports that she wakes up in the middle of the night about 3 times to use the bathroom. She says that it's difficult to go back to sleep.

## 2023-12-17 NOTE — Telephone Encounter (Signed)
Shekita called to ask if she could be prescribed something other than Prozac.  She is having moire anxiety attacks and is not doing well.  She was very nervous and anxious on the phone.  Next appt 3/12.  Pharmacy is  [facility] [address] [place] - [address]     Please call to discuss med issues.

## 2023-12-17 NOTE — Telephone Encounter (Signed)
Call her and send to [facility]. This is not critical and no change made today is going to help right away. Almira Coaster can address it on Monday.

## 2023-12-21 ENCOUNTER — Telehealth: Payer: Self-pay | Admitting: Adult Health

## 2023-12-21 NOTE — Telephone Encounter (Signed)
 Daughter, Bonita Quin, called and LM at 12:57 to report that Janet Bailey is having headaches due to the prozac.  Doesn't she can tolerate this medication.  She is having panic attacks daily and not doing well.  Please call.  Next appt 3/12

## 2023-12-21 NOTE — Telephone Encounter (Signed)
 Pt notified to stop the Prozac. Bonita Quin, pts daughter, says mother is being waken up with panic attacks everday, having them every morning, and is a nervous wreck daily. She can't take care of herself. She does not feel like doing anything. She feels safest in the bed. When she took Prozac in the past it gave her headaches then too. She wants to know what can be done about her panic attacks. The clonazepam 0.5 TID as needed does not seem to be working.

## 2023-12-21 NOTE — Telephone Encounter (Signed)
 Ranelle Oyster (Daughter) [phone]

## 2023-12-22 ENCOUNTER — Other Ambulatory Visit: Payer: Self-pay

## 2023-12-22 MED ORDER — DESVENLAFAXINE SUCCINATE ER 25 MG PO TB24: 25.0000 mg | ORAL_TABLET | Freq: Every day | ORAL | 1 refills | Status: DC

## 2023-12-27 ENCOUNTER — Ambulatory Visit: Payer: Medicare Other | Admitting: Psychiatry

## 2024-01-03 ENCOUNTER — Ambulatory Visit: Payer: Medicare Other | Admitting: Psychiatry

## 2024-01-03 ENCOUNTER — Telehealth: Payer: Self-pay | Admitting: Adult Health

## 2024-01-03 NOTE — Telephone Encounter (Signed)
 Next visit is 01/12/24. Requesting Focalin XR 15 mg. Stopped Focalin because it was making her feel sick and feel hyperactive. Pharmacy is:  [facility] [place], [place] - [address]   Phone: [phone]  Fax: [phone]

## 2024-01-03 NOTE — Telephone Encounter (Signed)
 Patient reporting Pristiq is making her restless and anxious. She can't sit still, lays down and then has to get up. She is very anxious, voice quivering. Dtr was with her.

## 2024-01-03 NOTE — Telephone Encounter (Signed)
 Patient advised to stop Pristiq. Reports she doesn't know if she wants to try a different medication, "everything messes me up."

## 2024-01-04 ENCOUNTER — Telehealth: Payer: Self-pay | Admitting: Adult Health

## 2024-01-04 NOTE — Telephone Encounter (Signed)
 Please see message from dtr. Told her that pt had not been taking the Pristiq regularly and you had stopped it. Pt has been taking the clonazepam and it helped some, but not controlling the near constant PA. When I talked with pt today it sounded like she was hyperventilating. Dtr reports this has been an issue since stopping the Prozac the last time. The anxiety causes GI upset. Asked dtr to send Genesight again and provided fax #. Asked to address to you. Dtr reports pt was given Valium in the ED and she is now sleeping. Told dtr that the Genesight would help guide the next medication to try. I asked about therapy and she has canceled 2 visits with Deb, has yet to see her. Dtr said the issue yesterday was the time of the appt. Due to Medicare rules needs to be seen in person before can do telehealth. Will ask if she can start telehealth first and then have an in office visit.

## 2024-01-04 NOTE — Telephone Encounter (Signed)
 Daughter lvm that her mom is having panic attacks. She stopped eating and drinking. Daughter took her to the hospital where she is receiving fluids. Daughter wants to know what to do about the prestiq. Since she won't take it she may have to trick her to take it or is another medicine she needs to be on to help with the panic attacks. Please call linda at [phone]

## 2024-01-05 NOTE — Telephone Encounter (Signed)
 Daughter Bonita Quin LVM @ 12:50 p asking if we got the Genesite results they faxed. They are in [employer].  Daughter said they got Valium at the ED, but only got 10 pills (2 pills at day of 5mg  each).  She said that has helped. Bonita Quin is on the way to take pt to [facility] to stay with Linda's sister because Bonita Quin is having a surgical procedure.  [place]  She did not indicate how long she will be there.  She is asking Almira Coaster to call in more Valium to [facility] [address], [place], [place] [address].  Marland Kitchen  Next appt 3/12

## 2024-01-06 NOTE — Telephone Encounter (Signed)
 Dtr said that pt has been much better on the Valium, did not have a PA yesterday. (? If being with dtr 24/7 played a role.) She is staying with her other dtr for a few days. She is not taking the clonazepam since the Valium was started. She was on Valium 10 mg at bedtime 12/29/22-11/15/23 with notation that it did not help with PA.   Genesight Green  Amitriptyline - previously reported effective Bupropion Clomipramine Desipramine Desvenlafaxine - recently tried - increased anxiety Doxepine Imipramine Fetzima Nortriptyline Selegiline Trazodone Venlafaxine - failed  Vilazodone Trintellix - possibly dropped sodium  Duloxetine Luvox Remeron  Yellow Prozac - failed Celexa - failed Lexapro  - failed Paxil Zoloft - failed  No AD in red  Anxiolytics Looks like all in green - none in yellow or red

## 2024-01-07 ENCOUNTER — Other Ambulatory Visit: Payer: Self-pay

## 2024-01-07 MED ORDER — DIAZEPAM 5 MG PO TABS: 5.0000 mg | ORAL_TABLET | Freq: Two times a day (BID) | ORAL | 0 refills | Status: DC

## 2024-01-07 MED ORDER — VILAZODONE HCL 10 MG PO TABS: 10.0000 mg | ORAL_TABLET | Freq: Every day | ORAL | 0 refills | Status: DC

## 2024-01-07 NOTE — Telephone Encounter (Signed)
 Sent Viibryd and pended Valium to the [facility]. Notified Linda, dtr.

## 2024-01-12 ENCOUNTER — Telehealth: Payer: Medicare Other | Admitting: Adult Health

## 2024-01-20 ENCOUNTER — Telehealth: Payer: Self-pay

## 2024-01-28 ENCOUNTER — Telehealth: Payer: Self-pay | Admitting: Internal Medicine

## 2024-01-28 NOTE — Telephone Encounter (Signed)
Patient's daughter is returning your call 

## 2024-01-28 NOTE — Telephone Encounter (Signed)
 Inbound call from patient's daughter stating patient had a swallow study done today and results showed severe mobility dysfunction. Patient has been having trouble swallowing and unable to keep anything down. Stated she was advised to have patient seen as soon as possible. Requesting a call back. Please advise, thank you.

## 2024-01-28 NOTE — Telephone Encounter (Signed)
 Left message for daughter to call back.

## 2024-01-31 ENCOUNTER — Telehealth: Payer: Self-pay | Admitting: Internal Medicine

## 2024-01-31 NOTE — Telephone Encounter (Signed)
 I have spoken to patient's daughter, Bonita Quin (ok per DPR), who states that her mother was recently diagnosed with severe esophageal dysmotility and PCP advised she see gastroenterology for this. I offered to schedule patient to see Boone Master, PA-C tomorrow, 02/01/24, but Bonita Quin says that the patient is having a procedure done with neurology tomorrow. I offered appointment with [facility] for 02/10/24 which Bonita Quin took but says she would like something sooner. Asks to be placed on a wait list. Bonita Quin says that her mother has been having nausea and vomiting and has been unable to keep much down. I have advised that if patient is unable to tolerate oral intake, is having severe chest pain, develops dizziness, SOB, she should seek emergent care prior to 02/10/24 visit.

## 2024-01-31 NOTE — Telephone Encounter (Signed)
 Patient daughter named Bonita Quin called and stated that she is needing to speak to the nurse regarding scheduling her mother for a office visit this week or so. Patient daughter stated that she has had the Swallow study test done and her mother results where serve mobility of her esophagus. Patient daughter would like a call back. Please advise.

## 2024-01-31 NOTE — Telephone Encounter (Signed)
 See 01/31/24 telephone call for additional information.

## 2024-02-03 ENCOUNTER — Other Ambulatory Visit: Payer: Self-pay

## 2024-02-03 ENCOUNTER — Telehealth: Payer: Self-pay | Admitting: Adult Health

## 2024-02-03 MED ORDER — VILAZODONE HCL 10 MG PO TABS: 10.0000 mg | ORAL_TABLET | Freq: Every day | ORAL | 0 refills | Status: DC

## 2024-02-03 NOTE — Telephone Encounter (Signed)
 Daughter called requesting Rx for Viibryd generic to [facility] [address]  [place] and Valium to [facility] [address] [employer]. [address]. Pt is going back and forth between both daughters.

## 2024-02-03 NOTE — Telephone Encounter (Signed)
 RF for Viibryd sent to [facility], pended RF for Valium to [facility].

## 2024-02-04 MED ORDER — DIAZEPAM 5 MG PO TABS: 5.0000 mg | ORAL_TABLET | Freq: Two times a day (BID) | ORAL | 0 refills | Status: DC

## 2024-02-09 NOTE — Progress Notes (Unsigned)
 Chief Complaint: Primary GI MD: Dr. Marina Goodell  HPI:  *** is a  ***  who was referred to me by Roe Rutherford, NP for a complaint of *** .     Discussed the use of AI scribe software for clinical note transcription with the patient, who gave verbal consent to proceed.  History of Present Illness      PREVIOUS GI WORKUP     Past Medical History:  Diagnosis Date   Arthritis    Colon polyps    Depression    Dysrhythmia    GERD (gastroesophageal reflux disease)    Headache    Hiatal hernia    Hypertension    TIA (transient ischemic attack)     Past Surgical History:  Procedure Laterality Date   ABDOMINAL HYSTERECTOMY     cataract surgery     CHOLECYSTECTOMY     HIATAL HERNIA REPAIR N/A 02/09/2023   Procedure: LAPAROSCOPIC HIATAL HERNIA REPAIR; UPPER ENDOSCOPY;  Surgeon: Luretha Murphy, MD;  Location: [facility];  Service: General;  Laterality: N/A;   INGUINAL HERNIA REPAIR     tension free transvaginal tape procedure      Current Outpatient Medications  Medication Sig Dispense Refill   alum & mag hydroxide-simeth (MAALOX/MYLANTA) 200-200-20 MG/5ML suspension Take 15 mLs by mouth every 6 (six) hours as needed for indigestion or heartburn. 355 mL 0   Calcium Carbonate-Vitamin D 600-200 MG-UNIT TABS Take 1 tablet by mouth daily.     cholecalciferol (VITAMIN D) 1000 UNITS tablet Take 1,000 Units by mouth 2 (two) times daily.     Cyanocobalamin (B-12 PO) Take 1 tablet by mouth daily.     desvenlafaxine (PRISTIQ) 25 MG 24 hr tablet Take 1 tablet (25 mg total) by mouth daily. 30 tablet 1   diazepam (VALIUM) 5 MG tablet Take 1 tablet (5 mg total) by mouth 2 (two) times daily. 60 tablet 0   FLUoxetine (PROZAC) 20 MG capsule Take 1 capsule (20 mg total) by mouth daily. 30 capsule 2   HYDROcodone-acetaminophen (NORCO/VICODIN) 5-325 MG tablet Take 1 tablet by mouth every 6 (six) hours as needed for moderate pain. 15 tablet 0   isosorbide dinitrate (ISORDIL) 30 MG tablet Take 0.5  tablets (15 mg total) by mouth 2 (two) times daily. (Patient not taking: Reported on 02/03/2023) 30 tablet 1   lidocaine (LIDODERM) 5 % Place 1 patch onto the skin daily as needed (pain).     lisinopril (ZESTRIL) 20 MG tablet Take 20 mg by mouth in the morning and at bedtime.     losartan (COZAAR) 100 MG tablet Take 1 tablet (100 mg total) by mouth daily. Take 1 Tablet Daily. (Patient not taking: Reported on 02/03/2023) 90 tablet 2   meclizine (ANTIVERT) 25 MG tablet Take 25 mg by mouth 3 (three) times daily as needed for dizziness.     omeprazole (PRILOSEC) 20 MG capsule Take 20 mg by mouth in the morning and at bedtime.     pantoprazole (PROTONIX) 40 MG tablet Take 1 tablet (40 mg total) by mouth 2 (two) times daily. (Patient not taking: Reported on 02/03/2023) 60 tablet 0   Vilazodone HCl (VIIBRYD) 10 MG TABS Take 1 tablet (10 mg total) by mouth daily. 30 tablet 0   No current facility-administered medications for this visit.    Allergies as of 02/10/2024 - Review Complete 12/13/2023  Allergen Reaction Noted   Amoxicillin-pot clavulanate Diarrhea 08/29/2023    Family History  Problem Relation Age of Onset  Aneurysm Mother    Heart disease Father    Arrhythmia Sister    Heart disease Sister    Colon cancer Paternal Uncle    Diabetes Daughter        One has diabetes age 7. Other in good health    Social History   Socioeconomic History   Marital status: Widowed    Spouse name: Not on file   Number of children: 2   Years of education: Not on file   Highest education level: Not on file  Occupational History   Occupation: retired  Tobacco Use   Smoking status: Never   Smokeless tobacco: Never  Vaping Use   Vaping status: Never Used  Substance and Sexual Activity   Alcohol use: No   Drug use: No   Sexual activity: Not Currently    Comment: 1st intercourse 43 yo-1 partner  Other Topics Concern   Not on file  Social History Narrative   ** Merged History Encounter **        Social Drivers of Health   Financial Resource Strain: Low Risk  (02/07/2024)   Received from [facility]   Overall Financial Resource Strain (CARDIA)    Difficulty of Paying Living Expenses: Not hard at all  Food Insecurity: No Food Insecurity (02/07/2024)   Received from [facility]   Hunger Vital Sign    Worried About Running Out of Food in the Last Year: Never true    Ran Out of Food in the Last Year: Never true  Transportation Needs: No Transportation Needs (02/07/2024)   Received from [facility] - Transportation    Lack of Transportation (Medical): No    Lack of Transportation (Non-Medical): No  Physical Activity: Insufficiently Active (10/11/2023)   Received from [facility]   Exercise Vital Sign    Days of Exercise per Week: 2 days    Minutes of Exercise per Session: 30 min  Stress: No Stress Concern Present (10/11/2023)   Received from [facility] - Occupational Stress Questionnaire    Feeling of Stress : Not at all  Social Connections: Moderately Integrated (10/11/2023)   Received from [facility]   Social Network    How would you rate your social network (family, work, friends)?: Adequate participation with social networks  Intimate Partner Violence: Not At Risk (01/04/2024)   Received from [facility]   HITS    Over the last 12 months how often did your partner physically hurt you?: Never    Over the last 12 months how often did your partner insult you or talk down to you?: Never    Over the last 12 months how often did your partner threaten you with physical harm?: Never    Over the last 12 months how often did your partner scream or curse at you?: Never    Review of Systems:    Constitutional: No weight loss, fever, chills, weakness or fatigue HEENT: Eyes: No change in vision               Ears, Nose, Throat:  No change in hearing or congestion Skin: No rash or itching Cardiovascular: No chest pain,  chest pressure or palpitations   Respiratory: No SOB or cough Gastrointestinal: See HPI and otherwise negative Genitourinary: No dysuria or change in urinary frequency Neurological: No headache, dizziness or syncope Musculoskeletal: No new muscle or joint pain Hematologic: No bleeding or bruising Psychiatric: No history of depression or  anxiety    Physical Exam:  Vital signs: There were no vitals taken for this visit.  Constitutional: NAD, Well developed, Well nourished, alert and cooperative Head:  Normocephalic and atraumatic. Eyes:   PEERL, EOMI. No icterus. Conjunctiva pink. Respiratory: Respirations even and unlabored. Lungs clear to auscultation bilaterally.   No wheezes, crackles, or rhonchi.  Cardiovascular:  Regular rate and rhythm. No peripheral edema, cyanosis or pallor.  Gastrointestinal:  Soft, nondistended, nontender. No rebound or guarding. Normal bowel sounds. No appreciable masses or hepatomegaly. Rectal:  Not performed.  Msk:  Symmetrical without gross deformities. Without edema, no deformity or joint abnormality.  Neurologic:  Alert and  oriented x4;  grossly normal neurologically.  Skin:   Dry and intact without significant lesions or rashes. Psychiatric: Oriented to person, place and time. Demonstrates good judgement and reason without abnormal affect or behaviors.  Physical Exam    RELEVANT LABS AND IMAGING: CBC    Component Value Date/Time   WBC 7.1 02/11/2023 0503   RBC 3.33 (L) 02/11/2023 0503   HGB 10.0 (L) 02/11/2023 0503   HCT 31.4 (L) 02/11/2023 0503   PLT 204 02/11/2023 0503   MCV 94.3 02/11/2023 0503   MCH 30.0 02/11/2023 0503   MCHC 31.8 02/11/2023 0503   RDW 13.2 02/11/2023 0503   LYMPHSABS 2.2 02/11/2023 0503   MONOABS 0.6 02/11/2023 0503   EOSABS 0.1 02/11/2023 0503   BASOSABS 0.0 02/11/2023 0503    CMP     Component Value Date/Time   NA 133 (L) 02/10/2023 0437   K 3.2 (L) 02/10/2023 0437   CL 100 02/10/2023 0437   CO2 28  02/10/2023 0437   GLUCOSE 137 (H) 02/10/2023 0437   BUN 8 02/10/2023 0437   CREATININE 0.37 (L) 02/10/2023 0437   CREATININE 0.55 02/26/2014 1509   CALCIUM 8.2 (L) 02/10/2023 0437   PROT 5.3 (L) 07/29/2022 0857   ALBUMIN 3.0 (L) 07/29/2022 0857   AST 13 (L) 07/29/2022 0857   ALT 17 07/29/2022 0857   ALKPHOS 73 07/29/2022 0857   BILITOT 0.6 07/29/2022 0857   GFRNONAA >60 02/10/2023 0437     Assessment/Plan:   Assessment and Plan Assessment & Plan    Symptomatic paraesophageal hernia s/p hiatal hernia repeat 02/2023 at [facility] EGD 2023 with moderate hiatal hernia with cameron erosions and paraesophageal component, otherwise normal.  S/p hiatal hernia repair 02/2023.  CTAP 11/2023 with thickening of distal stomach/pylorus, diffuse thickening of distal left colon and sigmoid, duodenal diverticulum, hiatal hernia.  Esophagram for reflux, nausea, vomiting 01/2024 shows severe esophageal dysmotility with tertiary contractions and delayed emptying. No strictures. Tablet passed without delay. GERD not assessed due to vomiting.    L[facility] 02/09/2024, 12:34 PM  Cc: Roe Rutherford, NP

## 2024-02-10 ENCOUNTER — Ambulatory Visit: Payer: Self-pay | Admitting: Gastroenterology

## 2024-02-10 ENCOUNTER — Encounter: Payer: Self-pay | Admitting: Gastroenterology

## 2024-02-10 VITALS — BP 100/60 | HR 74 | Wt 101.0 lb

## 2024-02-10 DIAGNOSIS — K219 Gastro-esophageal reflux disease without esophagitis: Secondary | ICD-10-CM | POA: Diagnosis not present

## 2024-02-10 DIAGNOSIS — R1310 Dysphagia, unspecified: Secondary | ICD-10-CM | POA: Diagnosis not present

## 2024-02-10 DIAGNOSIS — R634 Abnormal weight loss: Secondary | ICD-10-CM

## 2024-02-10 DIAGNOSIS — R9389 Abnormal findings on diagnostic imaging of other specified body structures: Secondary | ICD-10-CM

## 2024-02-10 DIAGNOSIS — K449 Diaphragmatic hernia without obstruction or gangrene: Secondary | ICD-10-CM

## 2024-02-10 DIAGNOSIS — R933 Abnormal findings on diagnostic imaging of other parts of digestive tract: Secondary | ICD-10-CM | POA: Diagnosis not present

## 2024-02-10 DIAGNOSIS — K224 Dyskinesia of esophagus: Secondary | ICD-10-CM

## 2024-02-10 DIAGNOSIS — K529 Noninfective gastroenteritis and colitis, unspecified: Secondary | ICD-10-CM

## 2024-02-10 NOTE — Patient Instructions (Signed)
 You have been scheduled for an endoscopy. Please follow written instructions given to you at your visit today.  If you use inhalers (even only as needed), please bring them with you on the day of your procedure.  If you take any of the following medications, they will need to be adjusted prior to your procedure:   DO NOT TAKE 7 DAYS PRIOR TO TEST- Trulicity (dulaglutide) Ozempic, Wegovy (semaglutide) Mounjaro (tirzepatide) Bydureon Bcise (exanatide extended release)  DO NOT TAKE 1 DAY PRIOR TO YOUR TEST Rybelsus (semaglutide) Adlyxin (lixisenatide) Victoza (liraglutide) Byetta (exanatide) ___________________________________________________________________________  _______________________________________________________  If your blood pressure at your visit was 140/90 or greater, please contact your primary care physician to follow up on this.  _______________________________________________________  If you are age 20 or older, your body mass index should be between 23-30. Your Body mass index is 21.86 kg/m. If this is out of the aforementioned range listed, please consider follow up with your Primary Care Provider.  If you are age 72 or younger, your body mass index should be between 19-25. Your Body mass index is 21.86 kg/m. If this is out of the aformentioned range listed, please consider follow up with your Primary Care Provider.   ________________________________________________________  The [facility] providers would like to encourage you to use [facility] to communicate with providers for non-urgent requests or questions.  Due to long hold times on the telephone, sending your provider a message by [facility] may be a faster and more efficient way to get a response.  Please allow 48 business hours for a response.  Please remember that this is for non-urgent requests.  _______________________________________________________ Thank you for trusting me with your gastrointestinal care!    Boone Master, PA

## 2024-02-11 NOTE — Progress Notes (Signed)
[facility] for EGD. Thanks

## 2024-02-13 NOTE — Telephone Encounter (Signed)
 Marland Kitchen

## 2024-02-24 ENCOUNTER — Telehealth: Payer: Self-pay | Admitting: Adult Health

## 2024-02-24 ENCOUNTER — Encounter: Payer: Self-pay | Admitting: Internal Medicine

## 2024-02-24 ENCOUNTER — Telehealth: Payer: Self-pay | Admitting: Gastroenterology

## 2024-02-24 ENCOUNTER — Other Ambulatory Visit: Payer: Self-pay | Admitting: Adult Health

## 2024-02-24 NOTE — Telephone Encounter (Signed)
 Inbound call from patient requesting to speak with a nurse. States she is having issues swallowing.   Upcoming egd for May 2025. Checked for sooner apts, nothing sooner available at the time.   Please advise.

## 2024-02-24 NOTE — Telephone Encounter (Signed)
 Pt's daughter LVM @ 12:05p.  I called back and spoke to pt and daughter on the phone.  Pt is not doing well and she is asking if she can increase the Valium  to one more tablet per day for a total of 3 a day.  She said she is not able to sleep.  She has about 24 pills left, so she doesn't need a refill yet but she would like a call back to confirm if it's ok to increase the dose.  Next appt 4/29

## 2024-02-24 NOTE — Telephone Encounter (Signed)
 I have spoken to patient who would like to move her endoscopy appointment to 03/02/24 availability with Dr Elvin Hammer. She states that she is having pretty severe dysphagia and was told that her esophagus was not working. States she needs something sooner than 03/24/24 for relief. I explained that she does have some esophageal dysmotility that will not likely be a "quick fix" with endoscopy. However, we can certainly directly visualize the esophagus and stomach to see if there are any other abnormalities contributing to her symptoms or any medications that may help. Patient is states that she has been eating yogurt and pudding, occasional eggs. She is advised that she should remain on soft foods, should chew thoroughly and take sips of fluids between bites of food. She is also advised that she should have small, more frequent meal times. Suggested Ensure or Boost protein shakes a couple times daily for nutrition while she awaits further evaluation on 03/02/24. She verbalizes understanding.

## 2024-02-25 NOTE — Telephone Encounter (Signed)
 Janet Bailey, informed. Told her we need to look at a sleep med and it can be discussed at her appt.

## 2024-02-25 NOTE — Telephone Encounter (Signed)
 Please see message from patient's dtr. She has an appt on 4/29.

## 2024-02-29 ENCOUNTER — Telehealth: Admitting: Adult Health

## 2024-02-29 ENCOUNTER — Encounter: Payer: Self-pay | Admitting: Adult Health

## 2024-02-29 ENCOUNTER — Encounter: Payer: Self-pay | Admitting: Adult Health Nurse Practitioner

## 2024-02-29 ENCOUNTER — Telehealth: Payer: Self-pay

## 2024-02-29 DIAGNOSIS — F411 Generalized anxiety disorder: Secondary | ICD-10-CM | POA: Diagnosis not present

## 2024-02-29 DIAGNOSIS — F331 Major depressive disorder, recurrent, moderate: Secondary | ICD-10-CM | POA: Diagnosis not present

## 2024-02-29 NOTE — Telephone Encounter (Signed)
 From [facility]xiolytics/Hypnot[facility] Alprazolam  Buspar Librium Klonopin  Tranxene Valium  Lunesta Dayvigo Ativan Serax Belsomra Temazepam Zolpidem Propranolol  Chart review notes Seroquel 50 mg was helpful Amitriptyline also reported previously effective, was on 25/50

## 2024-02-29 NOTE — Progress Notes (Signed)
 Janet Bailey [id number] Apr 13, 1946 78 y.o.  Virtual Visit via Video Note  I connected with pt @ on 02/29/24 at 11:30 AM EDT by a video enabled telemedicine application and verified that I am speaking with the correct person using two identifiers.   I discussed the limitations of evaluation and management by telemedicine and the availability of in person appointments. The patient expressed understanding and agreed to proceed.  I discussed the assessment and treatment plan with the patient. The patient was provided an opportunity to ask questions and all were answered. The patient agreed with the plan and demonstrated an understanding of the instructions.   The patient was advised to call back or seek an in-person evaluation if the symptoms worsen or if the condition fails to improve as anticipated.  I provided 30 minutes of non-face-to-face time during this encounter.  The patient was located at home.  The provider was located at [facility].   Reagan Camera, NP   Subjective:   Patient ID:  Janet Bailey is a 78 y.o. (DOB 01-25-1946) female.  Chief Complaint: No chief complaint on file.   HPI Jillean Liva presents for follow-up of MDD, GAD and insomnia.  Describes mood today as "not too good". Pleasant. Reports tearfulness. Mood symptoms - reports depression, anxiety and irritability. Stating "I feel nervous all the time". Reports daily panic attacks. Reports having to move several times - "that has made things worse". Reports decreased interest and motivation. Stating "I'm not having a lot of good days".  Reports increased health issues and changes. Reports worry, rumination and over thinking.  Reports mood is lower. Feels like medications are helpful. Taking medications as prescribed, but willing to consider other options.  Energy levels lower. Active, does not have a regular exercise routine. Reports walking as tolerated - trying to walk some. Reports  she does not enjoy usual interests and activities. Reports multiple health issues and changes have worsened mood. She is now living between her 2 daughters. Stating "I've had so many changes over the past three years and I can't handle it all". Feels like medications are helpful.  Appetite adequate. Weight loss - 103 pounds - reports a loss with IBS. Reports sleeping better some nights than others. Averages 4 to 6 hours of broken sleep. Denies daytime napping.  Denies difficulties with focus and concentration - trying to do things around the house. Completing tasks - cooking and cleaning. Managing aspects of household. Denies SI or HI.  Denies AH or VH. Denies self harm.  Denies substance use.  Previous medication trials: Lexapro, Prozac , Elavil, Effexor, Cymbalta, Remeron, Zoloft, Depakote, Seroquel, Trintellix, Valium , Wellbutrin, Clonazepam , Olanzapine.     Review of Systems:  Review of Systems  Musculoskeletal:  Negative for gait problem.  Neurological:  Negative for tremors.  Psychiatric/Behavioral:         Please refer to HPI    Medications: I have reviewed the patient's current medications.  Current Outpatient Medications  Medication Sig Dispense Refill   alum & mag hydroxide-simeth (MAALOX/MYLANTA) 200-200-20 MG/5ML suspension Take 15 mLs by mouth every 6 (six) hours as needed for indigestion or heartburn. 355 mL 0   Calcium  Carbonate-Vitamin D  600-200 MG-UNIT TABS Take 1 tablet by mouth daily.     cholecalciferol  (VITAMIN D ) 1000 UNITS tablet Take 1,000 Units by mouth 2 (two) times daily.     Cyanocobalamin  (B-12 PO) Take 1 tablet by mouth daily.     diazepam  (VALIUM ) 5 MG tablet Take 1  tablet (5 mg total) by mouth 2 (two) times daily. 60 tablet 0   HYDROcodone -acetaminophen  (NORCO/VICODIN) 5-325 MG tablet Take 1 tablet by mouth every 6 (six) hours as needed for moderate pain. 15 tablet 0   lidocaine  (LIDODERM ) 5 % Place 1 patch onto the skin daily as needed (pain).      meclizine (ANTIVERT) 25 MG tablet Take 25 mg by mouth 3 (three) times daily as needed for dizziness.     metoprolol  tartrate (LOPRESSOR ) 25 MG tablet Take 25 mg by mouth 2 (two) times daily.     omeprazole  (PRILOSEC) 20 MG capsule Take 20 mg by mouth in the morning and at bedtime.     No current facility-administered medications for this visit.    Medication Side Effects: None  Allergies:  Allergies  Allergen Reactions   Amoxicillin-Pot Clavulanate Diarrhea    Past Medical History:  Diagnosis Date   Arthritis    Colon polyps    Depression    Dysrhythmia    GERD (gastroesophageal reflux disease)    Headache    Hiatal hernia    Hypertension    TIA (transient ischemic attack)     Family History  Problem Relation Age of Onset   Aneurysm Mother    Heart disease Father    Arrhythmia Sister    Heart disease Sister    Colon cancer Paternal Uncle    Diabetes Daughter        One has diabetes age 55. Other in good health    Social History   Socioeconomic History   Marital status: Widowed    Spouse name: Not on file   Number of children: 2   Years of education: Not on file   Highest education level: Not on file  Occupational History   Occupation: retired  Tobacco Use   Smoking status: Never   Smokeless tobacco: Never  Vaping Use   Vaping status: Never Used  Substance and Sexual Activity   Alcohol use: No   Drug use: No   Sexual activity: Not Currently    Comment: 1st intercourse 43 yo-1 partner  Other Topics Concern   Not on file  Social History Narrative   ** Merged History Encounter **       Social Drivers of Health   Financial Resource Strain: Low Risk  (02/07/2024)   Received from [facility]   Overall Financial Resource Strain (CARDIA)    Difficulty of Paying Living Expenses: Not hard at all  Food Insecurity: No Food Insecurity (02/07/2024)   Received from [facility]   Hunger Vital Sign    Worried About Running Out of Food in the Last Year: Never  true    Ran Out of Food in the Last Year: Never true  Transportation Needs: No Transportation Needs (02/07/2024)   Received from [facility] - Transportation    Lack of Transportation (Medical): No    Lack of Transportation (Non-Medical): No  Physical Activity: Insufficiently Active (10/11/2023)   Received from [facility]   Exercise Vital Sign    Days of Exercise per Week: 2 days    Minutes of Exercise per Session: 30 min  Stress: No Stress Concern Present (10/11/2023)   Received from [facility] - Occupational Stress Questionnaire    Feeling of Stress : Not at all  Social Connections: Moderately Integrated (10/11/2023)   [facility] from [facility]   Social Network    How would you rate  your social network (family, work, friends)?: Adequate participation with social networks  Intimate Partner Violence: Not At Risk (01/04/2024)   Received from [facility]   HITS    Over the last 12 months how often did your partner physically hurt you?: Never    Over the last 12 months how often did your partner insult you or talk down to you?: Never    Over the last 12 months how often did your partner threaten you with physical harm?: Never    Over the last 12 months how often did your partner scream or curse at you?: Never    Past Medical History, Surgical history, Social history, and Family history were reviewed and updated as appropriate.   Please see review of systems for further details on the patient's review from today.   Objective:   Physical Exam:  There were no vitals taken for this visit.  Physical Exam Constitutional:      General: She is not in acute distress. Musculoskeletal:        General: No deformity.  Neurological:     Mental Status: She is alert and oriented to person, place, and time.     Coordination: Coordination normal.  Psychiatric:        Attention and Perception: Attention and perception normal. She does  not perceive auditory or visual hallucinations.        Mood and Affect: Mood normal. Affect is not labile, blunt, angry or inappropriate.        Speech: Speech normal.        Behavior: Behavior normal.        Thought Content: Thought content normal. Thought content is not paranoid or delusional. Thought content does not include homicidal or suicidal ideation. Thought content does not include homicidal or suicidal plan.        Cognition and Memory: Cognition and memory normal.        Judgment: Judgment normal.     Comments: Insight intact     Lab Review:     Component Value Date/Time   NA 133 (L) 02/10/2023 0437   K 3.2 (L) 02/10/2023 0437   CL 100 02/10/2023 0437   CO2 28 02/10/2023 0437   GLUCOSE 137 (H) 02/10/2023 0437   BUN 8 02/10/2023 0437   CREATININE 0.37 (L) 02/10/2023 0437   CREATININE 0.55 02/26/2014 1509   CALCIUM  8.2 (L) 02/10/2023 0437   PROT 5.3 (L) 07/29/2022 0857   ALBUMIN  3.0 (L) 07/29/2022 0857   AST 13 (L) 07/29/2022 0857   ALT 17 07/29/2022 0857   ALKPHOS 73 07/29/2022 0857   BILITOT 0.6 07/29/2022 0857   GFRNONAA >60 02/10/2023 0437       Component Value Date/Time   WBC 7.1 02/11/2023 0503   RBC 3.33 (L) 02/11/2023 0503   HGB 10.0 (L) 02/11/2023 0503   HCT 31.4 (L) 02/11/2023 0503   PLT 204 02/11/2023 0503   MCV 94.3 02/11/2023 0503   MCH 30.0 02/11/2023 0503   MCHC 31.8 02/11/2023 0503   RDW 13.2 02/11/2023 0503   LYMPHSABS 2.2 02/11/2023 0503   MONOABS 0.6 02/11/2023 0503   EOSABS 0.1 02/11/2023 0503   BASOSABS 0.0 02/11/2023 0503    No results found for: "POCLITH", "LITHIUM"   No results found for: "PHENYTOIN", "PHENOBARB", "VALPROATE", "CBMZ"   .res Assessment: Plan:    Treatment Plan/Recommendations:   Discussed medication changes with patient and one of her daughters. She would like to speak with daughter that is a Engineer, civil (consulting) before  making changes.   Patient asking to increase doses of current medications to help with mood  symptoms.  Viibyrd 10mg  to 20mg  daily  Valium  5mg  BID to TID  Using Melatonin as needed for sleep  RTC 4 weeks  30 minutes spent dedicated to the care of this patient on the date of this encounter to include pre-visit review of records, ordering of medication, post visit documentation, and face-to-face time with the patient discussing depression, anxiety and insomnia. Discussed continuing current medication regimen.  Diagnoses and all orders for this visit:  Major depressive disorder, recurrent episode, moderate (HCC)  Generalized anxiety disorder     Please see After Visit Summary for patient specific instructions.  Future Appointments  Date Time Provider Department Center  03/02/2024  9:30 AM Tobin Forts, MD [facility]    No orders of the defined types were placed in this encounter.     -------------------------------

## 2024-02-29 NOTE — Telephone Encounter (Signed)
 Pt lvm 12:16 requesting Valium  to [facility]. Could not understand other med to [employer]. LVM to RTC

## 2024-03-01 ENCOUNTER — Other Ambulatory Visit: Payer: Self-pay

## 2024-03-01 MED ORDER — DIAZEPAM 5 MG PO TABS: 5.0000 mg | ORAL_TABLET | Freq: Three times a day (TID) | ORAL | 0 refills | Status: DC | PRN

## 2024-03-01 MED ORDER — VILAZODONE HCL 20 MG PO TABS: 1.0000 | ORAL_TABLET | Freq: Every day | ORAL | 0 refills | Status: DC

## 2024-03-01 NOTE — Telephone Encounter (Signed)
 Pended Valium

## 2024-03-01 NOTE — Telephone Encounter (Signed)
 Patient returned call to office requesting Valium  be sent to [facility] [address] [place], [place] Ph: [phone].

## 2024-03-02 ENCOUNTER — Ambulatory Visit: Admitting: Internal Medicine

## 2024-03-02 ENCOUNTER — Encounter: Payer: Self-pay | Admitting: Internal Medicine

## 2024-03-02 ENCOUNTER — Other Ambulatory Visit: Payer: Self-pay

## 2024-03-02 VITALS — BP 134/74 | HR 72 | Temp 97.3°F | Resp 14 | Ht <= 58 in | Wt 101.0 lb

## 2024-03-02 DIAGNOSIS — R933 Abnormal findings on diagnostic imaging of other parts of digestive tract: Secondary | ICD-10-CM

## 2024-03-02 DIAGNOSIS — R1310 Dysphagia, unspecified: Secondary | ICD-10-CM

## 2024-03-02 DIAGNOSIS — K222 Esophageal obstruction: Secondary | ICD-10-CM | POA: Diagnosis not present

## 2024-03-02 DIAGNOSIS — K219 Gastro-esophageal reflux disease without esophagitis: Secondary | ICD-10-CM

## 2024-03-02 DIAGNOSIS — K224 Dyskinesia of esophagus: Secondary | ICD-10-CM

## 2024-03-02 MED ORDER — SUCRALFATE 1 GM/10ML PO SUSP: 1.0000 g | Freq: Three times a day (TID) | ORAL | 11 refills | Status: AC

## 2024-03-02 MED ORDER — DIAZEPAM 5 MG PO TABS: 5.0000 mg | ORAL_TABLET | Freq: Three times a day (TID) | ORAL | 0 refills | Status: DC | PRN

## 2024-03-02 MED ORDER — SODIUM CHLORIDE 0.9 % IV SOLN
500.0000 mL | Freq: Once | INTRAVENOUS | Status: DC
Start: 2024-03-02 — End: 2024-03-02

## 2024-03-02 NOTE — Progress Notes (Signed)
 Report to PACU, RN, vss, BBS= Clear.

## 2024-03-02 NOTE — Patient Instructions (Addendum)
 Post dilation diet, clear liquid for 2 hours then soft diet Chew foods well and eat slowly  Continue present medications, omeprazole  2 times daily Pick up new Rx for Carafate  suspension, use 3 times a day Office visit in 10 - 12 weeks with Dr Perry--July 23 at 2 pm  Handouts/information given for post dilation diet  YOU HAD AN ENDOSCOPIC PROCEDURE TODAY AT THE [facility]:   Refer to the procedure report that was given to you for any specific questions about what was found during the examination.  If the procedure report does not answer your questions, please call your gastroenterologist to clarify.  If you requested that your care partner not be given the details of your procedure findings, then the procedure report has been included in a sealed envelope for you to review at your convenience later.  YOU SHOULD EXPECT: Some feelings of bloating in the abdomen. Passage of more gas than usual.  Walking can help get rid of the air that was put into your GI tract during the procedure and reduce the bloating. If you had a lower endoscopy (such as a colonoscopy or flexible sigmoidoscopy) you may notice spotting of blood in your stool or on the toilet paper. If you underwent a bowel prep for your procedure, you may not have a normal bowel movement for a few days.  Please Note:  You might notice some irritation and congestion in your nose or some drainage.  This is from the oxygen used during your procedure.  There is no need for concern and it should clear up in a day or so.  SYMPTOMS TO REPORT IMMEDIATELY:  Following upper endoscopy (EGD)  Vomiting of blood or coffee ground material  New chest pain or pain under the shoulder blades  Painful or persistently difficult swallowing  New shortness of breath  Fever of 100F or higher  Black, tarry-looking stools  For urgent or emergent issues, a gastroenterologist can be reached at any hour by calling [phone]. Do not use MyChart  messaging for urgent concerns.   DIET:  SEE POST DILATION DIET HANDOUT.  Drink plenty of fluids but you should avoid alcoholic beverages for 24 hours.  ACTIVITY:  You should plan to take it easy for the rest of today and you should NOT DRIVE or use heavy machinery until tomorrow (because of the sedation medicines used during the test).    FOLLOW UP: Our staff will call the number listed on your records the next business day following your procedure.  We will call around 7:15- 8:00 am to check on you and address any questions or concerns that you may have regarding the information given to you following your procedure. If we do not reach you, we will leave a message.      SIGNATURES/CONFIDENTIALITY: You and/or your care partner have signed paperwork which will be entered into your electronic medical record.  These signatures attest to the fact that that the information above on your After Visit Summary has been reviewed and is understood.  Full responsibility of the confidentiality of this discharge information lies with you and/or your care-partner.

## 2024-03-02 NOTE — Op Note (Signed)
 [facility] Patient Name: Janet Bailey Procedure Date: 03/02/2024 10:13 AM MRN: [id number] Endoscopist: Murel Arlington. Elvin Hammer , MD, [id number] Age: 78 Referring MD:  Date of Birth: 06-Jul-1946 Gender: Female Account #: [id number] Procedure:                Upper GI endoscopy with balloon dilation of the                            esophagus. 20 mm max Indications:              Dysphagia, GERD, postprandial vomiting, weight                            loss. Prior paraesophageal hernia repair Medicines:                Monitored Anesthesia Care Procedure:                Pre-Anesthesia Assessment:                           - Prior to the procedure, a History and Physical                            was performed, and patient medications and                            allergies were reviewed. The patient's tolerance of                            previous anesthesia was also reviewed. The risks                            and benefits of the procedure and the sedation                            options and risks were discussed with the patient.                            All questions were answered, and informed consent                            was obtained. Prior Anticoagulants: The patient has                            taken no anticoagulant or antiplatelet agents. ASA                            Grade Assessment: II - A patient with mild systemic                            disease. After reviewing the risks and benefits,                            the patient was deemed in satisfactory condition to  undergo the procedure.                           After obtaining informed consent, the endoscope was                            passed under direct vision. Throughout the                            procedure, the patient's blood pressure, pulse, and                            oxygen saturations were monitored continuously. The                            GIF [id number]  #[id number] was introduced through the                            mouth, and advanced to the second part of duodenum.                            The upper GI endoscopy was accomplished without                            difficulty. The patient tolerated the procedure                            well. Scope In: Scope Out: Findings:                 The esophagus was foreshortened. No active                            inflammation.                           One benign-appearing, intrinsic moderate stenosis                            was found 29 cm from the incisors. This stenosis                            measured 1.5 cm (inner diameter). A TTS dilator was                            passed through the scope. Dilation with an 18-19-20                            mm balloon dilator was performed to 20 mm.                           The stomach was normal. Prior fundoplication intact.                           The examined duodenum was normal.  The cardia and gastric fundus were normal on                            retroflexion. Complications:            No immediate complications. Estimated Blood Loss:     Estimated blood loss: none. Impression:               - Benign-appearing esophageal stenosis. Dilated.                           - Normal stomach. Prior hernia repair intact.                           - Normal examined duodenum.                           - No specimens collected. Recommendation:           1. Patient has a contact number available for                            emergencies. The signs and symptoms of potential                            delayed complications were discussed with the                            patient. Return to normal activities tomorrow.                            Written discharge instructions were provided to the                            patient.                           2. Post dilation diet.                           3. Continue  present medications, including                            omeprazole  40 mg twice daily.                           4. Prescribe Carafate  slurry 1 g p.o. 3 times                            daily; 1 month supply; 11 refills                           5. Please chew food well and eat slowly                           6. Office follow-up with Dr. Elvin Hammer in 10 to 12 weeks Murel Arlington. Elvin Hammer, MD 03/02/2024 11:43:46 AM This report has been  signed electronically.

## 2024-03-02 NOTE — Progress Notes (Signed)
 Pt reports she is a dizzy but is feeling better. Denies any other sign or symptoms. Denies any chest pain.  Pt's states no medical or surgical changes since previsit or office visit.

## 2024-03-02 NOTE — Progress Notes (Signed)
 Called to room to assist during endoscopic procedure.  Patient ID and intended procedure confirmed with present staff. Received instructions for my participation in the procedure from the performing physician.

## 2024-03-02 NOTE — Progress Notes (Signed)
 Expand All Collapse All    Chief Complaint: esophageal dysmotility Primary GI MD: Dr. Elvin Hammer   HPI: Discussed the use of AI scribe software for clinical note transcription with the patient, who gave verbal consent to proceed.   History of Present Illness Janet Bailey is a 78 year old female with a history of paraesophageal hernia who presents with recurrent gastrointestinal symptoms. She is accompanied by her daughter, who is a Engineer, civil (consulting).   She has a history of paraesophageal hernia, which was repaired in April 2024. She is now experiencing a recurrence of symptoms including nausea, vomiting, and reflux. A CT scan in January 2025 showed thickening of the stomach and left sigmoid colon. An esophagram revealed severe esophageal dysmotility with tertiary contractions, and reflux could not be assessed due to vomiting during the procedure.   She has significant difficulty with eating, leading to weight loss from 110 pounds in October to 101 pounds currently. Nausea and vomiting occur with both solids and liquids, and her daughter has been limiting her diet to manage symptoms. She describes a sensation of food and liquids 'hanging' in her throat, leading to vomiting, and reports burning pain in the upper stomach area. These symptoms have worsened since October 2024.   She is currently taking omeprazole  40 mg twice daily. Her primary care provider expressed concern about long-term use due to potential effects on bone density. She also uses Carafate , which is crushed into a slurry due to difficulty swallowing pills.   She has a longstanding history of diarrhea, which she manages with Imodium. Episodes last two to three hours, often triggered by food intake. She has a history of gallbladder removal, which may contribute to her diarrhea. She has tried cholestyramine in the past but discontinued it due to difficulty with the taste and administration.   She experiences episodes of shortness of breath,  particularly when experiencing the sensation of food being stuck.      PREVIOUS GI WORKUP    Normal colonoscopy [place] 2016        Past Medical History:  Diagnosis Date   Arthritis     Colon polyps     Depression     Dysrhythmia     GERD (gastroesophageal reflux disease)     Headache     Hiatal hernia     Hypertension     TIA (transient ischemic attack)                 Past Surgical History:  Procedure Laterality Date   ABDOMINAL HYSTERECTOMY       ABLATION        neuro   cataract surgery       CHOLECYSTECTOMY       HERNIA REPAIR       HIATAL HERNIA REPAIR N/A 02/09/2023    Procedure: LAPAROSCOPIC HIATAL HERNIA REPAIR; UPPER ENDOSCOPY;  Surgeon: Jacolyn Matar, MD;  Location: [facility];  Service: General;  Laterality: N/A;   INGUINAL HERNIA REPAIR       PACEMAKER IMPLANT       tension free transvaginal tape procedure                    Current Outpatient Medications  Medication Sig Dispense Refill   alum & mag hydroxide-simeth (MAALOX/MYLANTA) 200-200-20 MG/5ML suspension Take 15 mLs by mouth every 6 (six) hours as needed for indigestion or heartburn. 355 mL 0   Calcium  Carbonate-Vitamin D  600-200 MG-UNIT TABS Take 1 tablet by mouth daily.  cholecalciferol  (VITAMIN D ) 1000 UNITS tablet Take 1,000 Units by mouth 2 (two) times daily.       Cyanocobalamin  (B-12 PO) Take 1 tablet by mouth daily.       diazepam  (VALIUM ) 5 MG tablet Take 1 tablet (5 mg total) by mouth 2 (two) times daily. 60 tablet 0   HYDROcodone -acetaminophen  (NORCO/VICODIN) 5-325 MG tablet Take 1 tablet by mouth every 6 (six) hours as needed for moderate pain. 15 tablet 0   lidocaine  (LIDODERM ) 5 % Place 1 patch onto the skin daily as needed (pain).       meclizine (ANTIVERT) 25 MG tablet Take 25 mg by mouth 3 (three) times daily as needed for dizziness.       metoprolol  tartrate (LOPRESSOR ) 25 MG tablet Take 25 mg by mouth 2 (two) times daily.       omeprazole  (PRILOSEC) 20 MG capsule Take  20 mg by mouth in the morning and at bedtime.       Vilazodone  HCl (VIIBRYD ) 10 MG TABS Take 1 tablet (10 mg total) by mouth daily. 30 tablet 0      No current facility-administered medications for this visit.             Allergies as of 02/10/2024 - Review Complete 02/10/2024  Allergen Reaction Noted   Amoxicillin-pot clavulanate Diarrhea 08/29/2023           Family History  Problem Relation Age of Onset   Aneurysm Mother     Heart disease Father     Arrhythmia Sister     Heart disease Sister     Colon cancer Paternal Uncle     Diabetes Daughter          One has diabetes age 17. Other in good health          Social History         Socioeconomic History   Marital status: Widowed      Spouse name: Not on file   Number of children: 2   Years of education: Not on file   Highest education level: Not on file  Occupational History   Occupation: retired  Tobacco Use   Smoking status: Never   Smokeless tobacco: Never  Vaping Use   Vaping status: Never Used  Substance and Sexual Activity   Alcohol use: No   Drug use: No   Sexual activity: Not Currently      Comment: 1st intercourse 53 yo-1 partner  Other Topics Concern   Not on file  Social History Narrative    ** Merged History Encounter **         Social Drivers of Health        Financial Resource Strain: Low Risk  (02/07/2024)    Received from [facility]    Overall Financial Resource Strain (CARDIA)     Difficulty of Paying Living Expenses: Not hard at all  Food Insecurity: No Food Insecurity (02/07/2024)    [facility] from [facility]    Hunger Vital Sign     Worried About Running Out of Food in the Last Year: Never true     Ran Out of Food in the Last Year: Never true  Transportation Needs: No Transportation Needs (02/07/2024)    Received from [facility] - Transportation     Lack of Transportation (Medical): No     Lack of Transportation (Non-Medical): No  Physical Activity:  Insufficiently Active (10/11/2023)    Received from [facility]  Exercise Vital Sign     Days of Exercise per Week: 2 days     Minutes of Exercise per Session: 30 min  Stress: No Stress Concern Present (10/11/2023)    Received from [facility] - Occupational Stress Questionnaire     Feeling of Stress : Not at all  Social Connections: Moderately Integrated (10/11/2023)    Received from [facility]    Social Network     How would you rate your social network (family, work, friends)?: Adequate participation with social networks  Intimate Partner Violence: Not At Risk (01/04/2024)    Received from [facility]    HITS     Over the last 12 months how often did your partner physically hurt you?: Never     Over the last 12 months how often did your partner insult you or talk down to you?: Never     Over the last 12 months how often did your partner threaten you with physical harm?: Never     Over the last 12 months how often did your partner scream or curse at you?: Never      Review of Systems:    Constitutional: No weight loss, fever, chills, weakness or fatigue HEENT: Eyes: No change in vision               Ears, Nose, Throat:  No change in hearing or congestion Skin: No rash or itching Cardiovascular: No chest pain, chest pressure or palpitations   Respiratory: No SOB or cough Gastrointestinal: See HPI and otherwise negative Genitourinary: No dysuria or change in urinary frequency Neurological: No headache, dizziness or syncope Musculoskeletal: No new muscle or joint pain Hematologic: No bleeding or bruising Psychiatric: No history of depression or anxiety      Physical Exam:  Vital signs: BP 100/60   Pulse 74   Wt 101 lb (45.8 kg)   BMI 21.86 kg/m    Constitutional: NAD, Well developed, Well nourished, alert and cooperative Head:  Normocephalic and atraumatic. Eyes:   PEERL, EOMI. No icterus. Conjunctiva pink. Respiratory:  Respirations even and unlabored. Lungs clear to auscultation bilaterally.   No wheezes, crackles, or rhonchi.  Cardiovascular:  Regular rate and rhythm. No peripheral edema, cyanosis or pallor.  Gastrointestinal:  Soft, nondistended, nontender. No rebound or guarding. Normal bowel sounds. No appreciable masses or hepatomegaly. Rectal:  Not performed.  Msk:  Symmetrical without gross deformities. Without edema, no deformity or joint abnormality.  Neurologic:  Alert and  oriented x4;  grossly normal neurologically.  Skin:   Dry and intact without significant lesions or rashes. Psychiatric: Oriented to person, place and time. Demonstrates good judgement and reason without abnormal affect or behaviors.     RELEVANT LABS AND IMAGING: CBC Labs (Brief)          Component Value Date/Time    WBC 7.1 02/11/2023 0503    RBC 3.33 (L) 02/11/2023 0503    HGB 10.0 (L) 02/11/2023 0503    HCT 31.4 (L) 02/11/2023 0503    PLT 204 02/11/2023 0503    MCV 94.3 02/11/2023 0503    MCH 30.0 02/11/2023 0503    MCHC 31.8 02/11/2023 0503    RDW 13.2 02/11/2023 0503    LYMPHSABS 2.2 02/11/2023 0503    MONOABS 0.6 02/11/2023 0503    EOSABS 0.1 02/11/2023 0503    BASOSABS 0.0 02/11/2023 0503        CMP     Labs (Brief)  Component Value Date/Time    NA 133 (L) 02/10/2023 0437    K 3.2 (L) 02/10/2023 0437    CL 100 02/10/2023 0437    CO2 28 02/10/2023 0437    GLUCOSE 137 (H) 02/10/2023 0437    BUN 8 02/10/2023 0437    CREATININE 0.37 (L) 02/10/2023 0437    CREATININE 0.55 02/26/2014 1509    CALCIUM  8.2 (L) 02/10/2023 0437    PROT 5.3 (L) 07/29/2022 0857    ALBUMIN  3.0 (L) 07/29/2022 0857    AST 13 (L) 07/29/2022 0857    ALT 17 07/29/2022 0857    ALKPHOS 73 07/29/2022 0857    BILITOT 0.6 07/29/2022 0857    GFRNONAA >60 02/10/2023 0437          Assessment/Plan:      Symptomatic paraesophageal hernia s/p hiatal hernia repair 02/2023 at [facility] now with recurrence of symptoms GERD Weight  loss EGD 2023 with moderate hiatal hernia with cameron erosions and paraesophageal component, otherwise normal.  S/p hiatal hernia repair 02/2023.  CTAP 11/2023 with thickening of distal stomach/pylorus, diffuse thickening of distal left colon and sigmoid, duodenal diverticulum, hiatal hernia.  Esophagram for reflux, nausea, vomiting 01/2024 shows severe esophageal dysmotility with tertiary contractions and delayed emptying. No strictures. Tablet passed without delay. GERD not assessed due to vomiting. Her current symptoms of reflux, nausea, vomiting, and dysphagia are worsened compared to prior to her surgery.  Esophagram in 2022 showed mild tertiary contractions and mild dysmotility which have obviously progressed.  PPI twice daily with no improvement. - Will likely need EGD for further evaluation and management dependent on EGD findings - Will discuss this further with Dr. Elvin Hammer - Continue to optimize antacid regimen   Abnormal CT scan Showing thickening of the left colon and sigmoid. Normal colonoscopy 2016 in [place].  Colonoscopy could be warranted to evaluate abnormal CT scan, however, patient unable to tolerate prep in the setting of her dysmotility issues.  Will need to manage above prior to considering colonoscopy - Manage esophageal dysmotility prior to considering colonoscopy   Chronic diarrhea Chronic diarrhea exacerbated by certain foods, managed with Imodium. Further evaluation needed, possibly related to previous cholecystectomy and bile acid malabsorption. Colestipol and cholestyramine not viable due to swallowing difficulties and taste intolerance. -- Continue Imodium as needed, up to six tablets daily. -- Introduce Benefiber to help regulate bowel movements.     This visit required 38 minutes of patient care (this includes precharting, chart review, review of results, face-to-face time used for counseling as well as treatment plan and follow-up. The patient was provided an  opportunity to ask questions and all were answered. The patient agreed with the plan and demonstrated an understanding of the instructions.    [facility] 02/10/2024, 3:32 PM   Cc: Lorre Rosin, NP

## 2024-03-03 ENCOUNTER — Telehealth: Payer: Self-pay

## 2024-03-03 NOTE — Telephone Encounter (Signed)
  Follow up Call-     03/02/2024   10:31 AM 01/02/2022    2:14 PM  Call back number  Post procedure Call Back phone  # [phone]  Permission to leave phone message Yes Yes     Patient questions:  Do you have a fever, pain , or abdominal swelling? No. Pain Score  0 *  Have you tolerated food without any problems? Yes.    Have you been able to return to your normal activities? Yes.    Do you have any questions about your discharge instructions: Diet   No. Medications  No. Follow up visit  No.  Do you have questions or concerns about your Care? No.  Actions: * If pain score is 4 or above: No action needed, pain <4.

## 2024-03-13 ENCOUNTER — Telehealth: Payer: Self-pay | Admitting: Adult Health

## 2024-03-13 NOTE — Telephone Encounter (Signed)
 Daughter Janet Bailey called and said that her mom Janet Bailey is out of control. She is complaining that she is sick and is dying. All she wants to do is take her meds and lay in the bed. Janet Bailey is obssive with her health and talks about all day long every day. Janet Bailey wants to know if Janet Bailey has any suggestions that she could recommend. Please call  Janet Bailey at [phone]

## 2024-03-14 NOTE — Telephone Encounter (Signed)
 Stana Ear reporting her mom said she is sick, thinks she is going to get sepsis and die.  She is whiney and doesn't want to do anything. Stana Ear reporting this behavior before starting Viibryd  and has not improved. Pt goes back and forth between her 2 daughters. When she is with [place] dtr at the beach she wants to be with her all the time. Pt has been to PCP and the ED recently. Full workup did not reveal any positives, no UTI. Anxiety is elevated. Pt was on clonazepam  and reported that wasn't working and wanted Valium . This has worked for a brief period but was recently increased, as well as the Viibryd . Pt now reporting the Valium  isn't working. Stana Ear reports pt saw a therapist (not at [facility]) that she had seen previously, but didn't care for. She was told to get a hobby and she reports being too sick. Pt has appt with you 5/22. Stana Ear is asking for any recommendations in the meantime.

## 2024-03-15 NOTE — Telephone Encounter (Signed)
 Notified Janet Bailey of recommendation. She said she is not real happy with that, but it's fine.

## 2024-03-21 ENCOUNTER — Telehealth: Payer: Self-pay | Admitting: Internal Medicine

## 2024-03-21 ENCOUNTER — Encounter: Payer: Self-pay | Admitting: Internal Medicine

## 2024-03-21 NOTE — Telephone Encounter (Signed)
 I spoke to pt who is tearful with c/o of abdominal pain located mostly in RUQ and RLQ. Pt states "I have the burning sensation from my throat that goes into my stomach". She is currently out of town. She states nothing is helping abdominal pain. She is eating a bland and soft diet, laying around, and nothing is helping. She tried to eat a sandwich yesterday and was unable to tolerate. Pt is taking mylanta with no improvement, carafate  and omeprazole  BID are causing nausea. Pt states pain is currently a 9/10. I recommended to pt that she needs to be evaluated at a local urgent care or ER. Pt states "I went to the ER yesterday that was [facility] and they didn't see anything". I asked pt if there is another UC or ER in the area and pt states "what would they do for me". Pt was present with daughter who states they could be here tomorrow.   Dr. Elvin Hammer please advise. Thanks, [employer] LPN

## 2024-03-21 NOTE — Telephone Encounter (Signed)
 Janet Bailey, Reviewed.  I am sorry she is not feeling well. I can find no GI cause for her complaints. She had recent upper endoscopy with esophageal dilation.  No significant abnormalities.  No improvement. She is on multiple GI medications without improvement. She had a recent CT scan of the abdomen pelvis which was unremarkable. I suspect that her symptoms may be functional in nature and related to underlying anxiety/depression.  This is a very common cause for GI complaints such as "burning sensation". I recommend that she see her behavioral specialist. If this is unsatisfactory, she could consider a second opinion with a GI doctor at a tertiary care facility, as I am out of other ideas.  If so, this should be arranged by her PCP. Thanks, Dr. Elvin Hammer

## 2024-03-21 NOTE — Telephone Encounter (Signed)
 Inbound call from patient daughter, Edwina Gram, stating patient has been having a burning sensation in her stomach. Daughter is requesting a call back at [phone]. Please advise, thank you.

## 2024-03-21 NOTE — Telephone Encounter (Signed)
 Noted. Thank you Dr. Elvin Hammer.

## 2024-03-23 ENCOUNTER — Telehealth (INDEPENDENT_AMBULATORY_CARE_PROVIDER_SITE_OTHER): Admitting: Adult Health

## 2024-03-23 ENCOUNTER — Encounter: Payer: Self-pay | Admitting: Adult Health

## 2024-03-23 DIAGNOSIS — G4700 Insomnia, unspecified: Secondary | ICD-10-CM | POA: Diagnosis not present

## 2024-03-23 DIAGNOSIS — F32A Depression, unspecified: Secondary | ICD-10-CM | POA: Diagnosis not present

## 2024-03-23 DIAGNOSIS — F419 Anxiety disorder, unspecified: Secondary | ICD-10-CM

## 2024-03-23 DIAGNOSIS — F331 Major depressive disorder, recurrent, moderate: Secondary | ICD-10-CM

## 2024-03-23 DIAGNOSIS — F411 Generalized anxiety disorder: Secondary | ICD-10-CM

## 2024-03-23 NOTE — Progress Notes (Unsigned)
 Janet Bailey [id number] [birth year] 78 y.o.  Virtual Visit via Video Note  I connected with pt @ on 03/28/24 at  4:00 PM EDT by a video enabled telemedicine application and verified that I am speaking with the correct person using two identifiers.   I discussed the limitations of evaluation and management by telemedicine and the availability of in person appointments. The patient expressed understanding and agreed to proceed.  I discussed the assessment and treatment plan with the patient. The patient was provided an opportunity to ask questions and all were answered. The patient agreed with the plan and demonstrated an understanding of the instructions.   The patient was advised to call back or seek an in-person evaluation if the symptoms worsen or if the condition fails to improve as anticipated.  I provided 25 minutes of non-face-to-face time during this encounter.  The patient was located at home.  The provider was located at [facility].   Reagan Camera, NP   Subjective:   Patient ID:  Janet Bailey is a 78 y.o. (DOB 12-17-1945) female.  Chief Complaint: No chief complaint on file.   HPI Janet Bailey presents for follow-up of MDD, GAD and insomnia.  Accompanied by daughter - via phone video.  Describes mood today as "not good". Pleasant. Reports tearfulness. Mood symptoms - reports depression, anxiety and irritability. Reports decreased interest and motivation - "I can't get interested in anything". Reports daily panic attacks. Stating "I feel nervous all the time". Reports feeling restless - "I can't be still". Reports worry, rumination and over thinking. Stating "I have too much on my mind". Reports ongoing health issues and changes in her living situation. She is living between her 2 daughters. Reports mood is lower. Stating "I'm not doing any better". Reports current medications are not helping and would like to consider other options.  Energy  levels lower. Active, does not have a regular exercise routine. Reports walking as tolerated. Reports she does not enjoy usual interests and activities. Appetite adequate. Weight loss - 103 pounds - reports a loss with IBS. Reports sleeping better some nights than others. Averages 8 to 9 hours before awakening and then sleeps off and one. Denies daytime napping.  Reports difficulties with focus and concentration - trying to do things around the house.  Denies SI or HI.  Denies AH or VH. Denies self harm.  Denies substance use.  Previous medication trials: Lexapro, Prozac , Elavil, Effexor, Cymbalta, Remeron, Zoloft, Depakote, Seroquel, Trintellix, Valium , Wellbutrin, Clonazepam , Olanzapine, Viibryd   Review of Systems:  Review of Systems  Musculoskeletal:  Negative for gait problem.  Neurological:  Negative for tremors.  Psychiatric/Behavioral:         Please refer to HPI    Medications: I have reviewed the patient's current medications.  Current Outpatient Medications  Medication Sig Dispense Refill   alum & mag hydroxide-simeth (MAALOX/MYLANTA) 200-200-20 MG/5ML suspension Take 15 mLs by mouth every 6 (six) hours as needed for indigestion or heartburn. 355 mL 0   Calcium  Carbonate-Vitamin D  600-200 MG-UNIT TABS Take 1 tablet by mouth daily.     cholecalciferol  (VITAMIN D ) 1000 UNITS tablet Take 1,000 Units by mouth 2 (two) times daily.     clonazePAM  (KLONOPIN ) 0.5 MG tablet Take 1 tablet (0.5 mg total) by mouth 3 (three) times daily as needed for anxiety. 90 tablet 0   Cyanocobalamin  (B-12 PO) Take 1 tablet by mouth daily.     diazepam  (VALIUM ) 5 MG tablet Take 1 tablet (5  mg total) by mouth 3 (three) times daily as needed for anxiety. 90 tablet 0   estradiol  (ESTRACE ) 0.1 MG/GM vaginal cream Place 1 g vaginally.     HYDROcodone -acetaminophen  (NORCO/VICODIN) 5-325 MG tablet Take 1 tablet by mouth every 6 (six) hours as needed for moderate pain. 15 tablet 0   lidocaine  (LIDODERM ) 5 %  Place 1 patch onto the skin daily as needed (pain). (Patient not taking: Reported on 03/02/2024)     meclizine (ANTIVERT) 25 MG tablet Take 25 mg by mouth 3 (three) times daily as needed for dizziness.     metoprolol  tartrate (LOPRESSOR ) 25 MG tablet Take 25 mg by mouth 2 (two) times daily.     omeprazole  (PRILOSEC) 20 MG capsule Take 20 mg by mouth in the morning and at bedtime.     ondansetron  (ZOFRAN -ODT) 4 MG disintegrating tablet DISSOLVE 1 TABLET IN MOUTH EVERY 8 HOURS AS NEEDED FOR NAUSEA FOR UP TO 7 DAYS     promethazine  (PHENERGAN ) 25 MG tablet Take 12.5 mg by mouth.     risperiDONE (RISPERDAL) 0.5 MG tablet Take 1 tablet (0.5 mg total) by mouth at bedtime. 30 tablet 1   sucralfate  (CARAFATE ) 1 GM/10ML suspension Take 10 mLs (1 g total) by mouth 3 (three) times daily. 420 mL 11   traMADol (ULTRAM) 50 MG tablet Take 50 mg by mouth every 6 (six) hours as needed.     Vilazodone  HCl 20 MG TABS Take 1 tablet (20 mg total) by mouth daily. 30 tablet 0   No current facility-administered medications for this visit.    Medication Side Effects: None  Allergies:  Allergies  Allergen Reactions   Amoxicillin-Pot Clavulanate Diarrhea    Past Medical History:  Diagnosis Date   Arthritis    Colon polyps    Depression    Dysrhythmia    GERD (gastroesophageal reflux disease)    Headache    Hiatal hernia    Hypertension    TIA (transient ischemic attack)     Family History  Problem Relation Age of Onset   Aneurysm Mother    Heart disease Father    Arrhythmia Sister    Heart disease Sister    Colon cancer Paternal Uncle    Diabetes Daughter        One has diabetes age 77. Other in good health   Esophageal cancer Neg Hx    Rectal cancer Neg Hx    Stomach cancer Neg Hx     Social History   Socioeconomic History   Marital status: Widowed    Spouse name: Not on file   Number of children: 2   Years of education: Not on file   Highest education level: Not on file  Occupational  History   Occupation: retired  Tobacco Use   Smoking status: Never   Smokeless tobacco: Never  Vaping Use   Vaping status: Never Used  Substance and Sexual Activity   Alcohol use: No   Drug use: No   Sexual activity: Not Currently    Comment: 1st intercourse 18 yo-1 partner  Other Topics Concern   Not on file  Social History Narrative   ** Merged History Encounter **       Social Drivers of Health   Financial Resource Strain: Low Risk  (02/07/2024)   Received from [facility]   Overall Financial Resource Strain (CARDIA)    Difficulty of Paying Living Expenses: Not hard at all  Food Insecurity: No Food Insecurity (02/07/2024)  Received from [facility] Vital Sign    Worried About Running Out of Food in the Last Year: Never true    Ran Out of Food in the Last Year: Never true  Transportation Needs: No Transportation Needs (02/07/2024)   Received from [facility]   PRAPARE - Transportation    Lack of Transportation (Medical): No    Lack of Transportation (Non-Medical): No  Physical Activity: Insufficiently Active (10/11/2023)   Received from [facility]   Exercise Vital Sign    Days of Exercise per Week: 2 days    Minutes of Exercise per Session: 30 min  Stress: No Stress Concern Present (10/11/2023)   Received from [facility] - Occupational Stress Questionnaire    Feeling of Stress : Not at all  Social Connections: Moderately Integrated (10/11/2023)   Received from [facility]   Social Network    How would you rate your social network (family, work, friends)?: Adequate participation with social networks  Intimate Partner Violence: Not At Risk (03/21/2024)   Received from [facility]   HITS    Over the last 12 months how often did your partner physically hurt you?: Never    Over the last 12 months how often did your partner insult you or talk down to you?: Never    Over the last 12 months how often did your  partner threaten you with physical harm?: Never    Over the last 12 months how often did your partner scream or curse at you?: Never    Past Medical History, Surgical history, Social history, and Family history were reviewed and updated as appropriate.   Please see review of systems for further details on the patient's review from today.   Objective:   Physical Exam:  There were no vitals taken for this visit.  Physical Exam Constitutional:      General: She is not in acute distress. Musculoskeletal:        General: No deformity.  Neurological:     Mental Status: She is alert and oriented to person, place, and time.     Coordination: Coordination normal.  Psychiatric:        Attention and Perception: Attention and perception normal. She does not perceive auditory or visual hallucinations.        Mood and Affect: Mood is anxious and depressed. Affect is not labile, blunt, angry or inappropriate.        Speech: Speech normal.        Behavior: Behavior normal.        Thought Content: Thought content normal. Thought content is not paranoid or delusional. Thought content does not include homicidal or suicidal ideation. Thought content does not include homicidal or suicidal plan.        Cognition and Memory: Cognition and memory normal.        Judgment: Judgment normal.     Comments: Insight intact     Lab Review:     Component Value Date/Time   NA 133 (L) 02/10/2023 0437   K 3.2 (L) 02/10/2023 0437   CL 100 02/10/2023 0437   CO2 28 02/10/2023 0437   GLUCOSE 137 (H) 02/10/2023 0437   BUN 8 02/10/2023 0437   CREATININE 0.37 (L) 02/10/2023 0437   CREATININE 0.55 02/26/2014 1509   CALCIUM  8.2 (L) 02/10/2023 0437   PROT 5.3 (L) 07/29/2022 0857   ALBUMIN  3.0 (L) 07/29/2022 0857   AST 13 (L) 07/29/2022  0857   ALT 17 07/29/2022 0857   ALKPHOS 73 07/29/2022 0857   BILITOT 0.6 07/29/2022 0857   GFRNONAA >60 02/10/2023 0437       Component Value Date/Time   WBC 7.1 02/11/2023  0503   RBC 3.33 (L) 02/11/2023 0503   HGB 10.0 (L) 02/11/2023 0503   HCT 31.4 (L) 02/11/2023 0503   PLT 204 02/11/2023 0503   MCV 94.3 02/11/2023 0503   MCH 30.0 02/11/2023 0503   MCHC 31.8 02/11/2023 0503   RDW 13.2 02/11/2023 0503   LYMPHSABS 2.2 02/11/2023 0503   MONOABS 0.6 02/11/2023 0503   EOSABS 0.1 02/11/2023 0503   BASOSABS 0.0 02/11/2023 0503    No results found for: "POCLITH", "LITHIUM"   No results found for: "PHENYTOIN", "PHENOBARB", "VALPROATE", "CBMZ"   .res Assessment: Plan:    Treatment Plan/Recommendations:   Decrease Viibyrd 20mg  to 10mg  daily   D/C Valium  5mg  TID Add Clonzepam 0.5mg  TID Add Risperdal 0.5mg  at bedtime.  Using Melatonin as needed for sleep  RTC 4 weeks  30 minutes spent dedicated to the care of this patient on the date of this encounter to include pre-visit review of records, ordering of medication, post visit documentation, and face-to-face time with the patient discussing depression, anxiety and insomnia. Discussed continuing current medication regimen.  There are no diagnoses linked to this encounter.   Please see After Visit Summary for patient specific instructions.  Future Appointments  Date Time Provider Department Center  04/12/2024 11:00 AM [facility] PREVISIT RM 52 [facility]  04/24/2024  3:30 PM Phinley Schall, Ursula Gardner, NP [facility]-CP None  05/01/2024  1:00 PM Tobin Forts, MD [facility]  05/24/2024  2:00 PM Tobin Forts, MD [facility]    No orders of the defined types were placed in this encounter.     -------------------------------

## 2024-03-24 ENCOUNTER — Telehealth: Payer: Self-pay | Admitting: Adult Health

## 2024-03-24 ENCOUNTER — Encounter: Admitting: Internal Medicine

## 2024-03-24 NOTE — Telephone Encounter (Signed)
 Next appt is 04/24/24. Janet Bailey called stating that her Valium  is not helping her and her head hurts. She thinks Klonopin  would be better. Please call her at [phone].   Pharmacy is:  [facility] [address] [place] - [address]   Phone: [phone]  Fax: [phone]

## 2024-03-24 NOTE — Telephone Encounter (Signed)
 Pt LVM @ 1:07p asking if the nurse had called her back from yesterday.  She didn't have her phone.

## 2024-03-24 NOTE — Telephone Encounter (Signed)
 Please see message. Pt had a visit yesterday. She has been on clonazepam  previously for months and then switched back and forth between clonazepam  and Valium . She is also getting pain medication from 2 different providers. Family feels like issues are psychiatric.  She last filled Valium  5/2.   03/20/2024 03/20/2024 2  Tramadol Hcl 50 Mg Tablet 60.00 [address] [place] [id number] [facility] [phone]) 0/0 40.00 MME Medicare [place] 03/12/2024 03/11/2024 2  Oxycodone  Hcl (Ir) 5 Mg Tablet 20.00 5

## 2024-03-24 NOTE — Telephone Encounter (Signed)
 I have not called patient. Janet Bailey still reviewing [facility] to determine what to do next.

## 2024-03-28 ENCOUNTER — Telehealth: Payer: Self-pay | Admitting: Adult Health

## 2024-03-28 ENCOUNTER — Other Ambulatory Visit: Payer: Self-pay

## 2024-03-28 ENCOUNTER — Other Ambulatory Visit: Payer: Self-pay | Admitting: Adult Health

## 2024-03-28 ENCOUNTER — Encounter: Payer: Self-pay | Admitting: Adult Health

## 2024-03-28 MED ORDER — VILAZODONE HCL 10 MG PO TABS
10.0000 mg | ORAL_TABLET | Freq: Every day | ORAL | 0 refills | Status: DC
Start: 2024-03-28 — End: 2024-04-07

## 2024-03-28 MED ORDER — CLONAZEPAM 0.5 MG PO TABS: 0.5000 mg | ORAL_TABLET | Freq: Three times a day (TID) | ORAL | 0 refills | Status: DC | PRN

## 2024-03-28 MED ORDER — RISPERIDONE 0.5 MG PO TABS: 0.5000 mg | ORAL_TABLET | Freq: Every day | ORAL | 1 refills | Status: DC

## 2024-03-28 NOTE — Telephone Encounter (Signed)
 Sent Viibryd  10 mg to [facility] in [place].

## 2024-03-28 NOTE — Telephone Encounter (Signed)
 Pt's daughter, Stana Ear LVM @ 3:44p.  She is on the [facility].  She is stating that Bonnell Butcher told them to decrease the Viibryd  from 20mg  to 10mg .  They have been halfing the 20mg  pills but will still be out of medicine 5 days before pt's appt.   She wasn't sure if Bonnell Butcher wanted to call in the refill for the Viibryd  now.  Send to:  [facility] [address] [place], [place] [address] [phone]  Next appt 6/23

## 2024-03-28 NOTE — Telephone Encounter (Signed)
 Risperdal 0.5 mg sent to [facility] in [facility]. Pended Rx for clonazepam  0.5 TID PRN to [facility] as well. Patient switches between her 2 daughters.

## 2024-04-05 ENCOUNTER — Telehealth: Payer: Self-pay | Admitting: Adult Health

## 2024-04-05 ENCOUNTER — Other Ambulatory Visit: Payer: Self-pay

## 2024-04-05 NOTE — Telephone Encounter (Signed)
 Janet Bailey, Janet Bailey lvm that the klonopin  is not working and Janet Bailey needs to go back to the valium . Also the resperidone for sleep is not working. Janet Bailey wants to know can that be increased. Janet Bailey's brother is contemplating suicide. Please call linda at [phone]

## 2024-04-06 ENCOUNTER — Other Ambulatory Visit: Payer: Self-pay | Admitting: Adult Health

## 2024-04-06 NOTE — Telephone Encounter (Signed)
 Called Janet Bailey and let her know to increase Risperdal . She reports her mom will not take it because she read that Risperdal  is for bipolar and schizophrenia. She said they took mom to ED in [facility] and asked for a [facility] consult. It was determined that she could be managed as an outpatient.

## 2024-04-06 NOTE — Telephone Encounter (Signed)
 Janet Bailey didn't say, but reported when the Risperdal  was prescribed initially that they were having trouble getting her to take her medication.

## 2024-04-06 NOTE — Telephone Encounter (Signed)
 Please see message and advise

## 2024-04-07 ENCOUNTER — Other Ambulatory Visit: Payer: Self-pay | Admitting: Adult Health

## 2024-04-07 DIAGNOSIS — F331 Major depressive disorder, recurrent, moderate: Secondary | ICD-10-CM

## 2024-04-07 DIAGNOSIS — F411 Generalized anxiety disorder: Secondary | ICD-10-CM

## 2024-04-07 MED ORDER — DIAZEPAM 5 MG PO TABS: 5.0000 mg | ORAL_TABLET | Freq: Three times a day (TID) | ORAL | 0 refills | Status: DC | PRN

## 2024-04-07 MED ORDER — FLUVOXAMINE MALEATE 50 MG PO TABS: 50.0000 mg | ORAL_TABLET | Freq: Every day | ORAL | 0 refills | Status: DC

## 2024-04-07 MED ORDER — RISPERIDONE 1 MG PO TABS
1.0000 mg | ORAL_TABLET | Freq: Every day | ORAL | 0 refills | Status: DC
Start: 2024-04-07 — End: 2024-05-24

## 2024-04-12 ENCOUNTER — Telehealth: Payer: Self-pay | Admitting: Adult Health

## 2024-04-12 ENCOUNTER — Telehealth: Payer: Self-pay | Admitting: Internal Medicine

## 2024-04-12 ENCOUNTER — Ambulatory Visit (AMBULATORY_SURGERY_CENTER)

## 2024-04-12 ENCOUNTER — Encounter: Payer: Self-pay | Admitting: Internal Medicine

## 2024-04-12 VITALS — Ht <= 58 in | Wt 100.0 lb

## 2024-04-12 DIAGNOSIS — R1031 Right lower quadrant pain: Secondary | ICD-10-CM

## 2024-04-12 DIAGNOSIS — R197 Diarrhea, unspecified: Secondary | ICD-10-CM

## 2024-04-12 DIAGNOSIS — G8929 Other chronic pain: Secondary | ICD-10-CM

## 2024-04-12 DIAGNOSIS — R1011 Right upper quadrant pain: Secondary | ICD-10-CM

## 2024-04-12 MED ORDER — NA SULFATE-K SULFATE-MG SULF 17.5-3.13-1.6 GM/177ML PO SOLN: 1.0000 | Freq: Once | ORAL | 0 refills | Status: AC

## 2024-04-12 NOTE — Telephone Encounter (Signed)
 Patient doesn't know if medications are contributing. She has c/o same symptoms since we've seen her. She still sounds like she is hyperventilating.

## 2024-04-12 NOTE — Telephone Encounter (Signed)
 Pt lvm 12:25 pm stating can't take med. Making tremors worse. RTC [phone]

## 2024-04-12 NOTE — Progress Notes (Signed)
 Pre visit completed via phone call; Patient verified name, DOB, and address; No egg or soy allergy known to patient;  No issues known to pt with past sedation with any surgeries or procedures; Patient denies ever being told they had issues or difficulty with intubation; No FH of Malignant Hyperthermia; Pt is not on diet pills; Pt is not on home 02;  Pt is not on blood thinners;  Pt reports issues with constipation/diarrhea currently; MD aware;  No A fib or A flutter; Have any cardiac testing pending--NO Insurance verified during PV appt--- [facility] [employer] Medicare Pt can ambulate without assistance;  Pt denies use of chewing tobacco; Discussed diabetic/weight loss medication holds; Discussed NSAID holds; Checked BMI to be less than 50; Pt instructed to use Singlecare.com or GoodRx for a price reduction on prep;  Patient's chart reviewed by Rogena Class CNRA prior to previsit and patient appropriate for the [facility]; Pre visit completed and red dot placed by patient's name on their procedure day (on provider's schedule);  Instructions sent to MyChart per patient and patient's daughter;

## 2024-04-12 NOTE — Telephone Encounter (Signed)
 Pt requested prep instructions

## 2024-04-12 NOTE — Telephone Encounter (Signed)
 Pt reporting she is still not sleeping well. Said she wakes up quivering inside that lasts until midday. She reports she is taking all medications as prescribed, but Luvox  was just started last week and the increased dose of Risperdal  as well.

## 2024-04-13 NOTE — Telephone Encounter (Signed)
 Linda notified of the recommendation to increase Risperdal  dose.

## 2024-04-13 NOTE — Telephone Encounter (Signed)
 Told patient that she needed to give the medication more time to work. She said she can't take it, that she feels like her body is going to come out of her skin.  I told her that every medication change she reports SE. She said she knew it, but this one was worse than the others. She can't tell me if it is the increased dose of Risperdal  or the fluvoxamine  that is the issue. She said it is really bad in the morning.

## 2024-04-17 ENCOUNTER — Telehealth: Payer: Self-pay | Admitting: Adult Health

## 2024-04-17 NOTE — Telephone Encounter (Signed)
 Pt daughter called states pt has been taken to ER due to panic attacks, anxious and unable to sleep. Also states pt has weaned herself off Luvox  and Risperdal . Pt will be given a shot of Benedryl and Ativan at the hospital. Req cb to discuss what meds she can be put on.

## 2024-04-18 NOTE — Telephone Encounter (Signed)
 Janet Bailey reporting they took patient to ER yesterday for PA. She was given Ativan, Haldol, and Benadryl IM. She was also prescribed Seroquel 100 mg. She is with the other daughter now and Janet Bailey doesn't know if she took the Seroquel or not.  She is only taking the Valium  currently. Janet Bailey said they have been to the hospital twice and they won't keep pt because she is not suicidal or homicidal. Janet Bailey saying now that she probably took her out of [place] too early. ER provider is concerned that she is taking too much Valium , but Janet Bailey said they have to make her take it when she is acting a fool so she doesn't feel like she is addicted.  She is asking for something else.

## 2024-04-24 ENCOUNTER — Encounter: Payer: Self-pay | Admitting: Adult Health

## 2024-04-24 ENCOUNTER — Telehealth: Admitting: Adult Health

## 2024-04-24 DIAGNOSIS — G4700 Insomnia, unspecified: Secondary | ICD-10-CM | POA: Diagnosis not present

## 2024-04-24 DIAGNOSIS — F331 Major depressive disorder, recurrent, moderate: Secondary | ICD-10-CM

## 2024-04-24 DIAGNOSIS — F411 Generalized anxiety disorder: Secondary | ICD-10-CM | POA: Diagnosis not present

## 2024-04-24 MED ORDER — DIAZEPAM 5 MG PO TABS: 5.0000 mg | ORAL_TABLET | Freq: Three times a day (TID) | ORAL | 0 refills | Status: DC | PRN

## 2024-04-24 NOTE — Progress Notes (Signed)
 Janet Bailey [id number] 1946/02/22 78 y.o.  Virtual Visit via Video Note  I connected with pt @ on 04/24/24 at  3:30 PM EDT by a video enabled telemedicine application and verified that I am speaking with the correct person using two identifiers.   I discussed the limitations of evaluation and management by telemedicine and the availability of in person appointments. The patient expressed understanding and agreed to proceed.  I discussed the assessment and treatment plan with the patient. The patient was provided an opportunity to ask questions and all were answered. The patient agreed with the plan and demonstrated an understanding of the instructions.   The patient was advised to call back or seek an in-person evaluation if the symptoms worsen or if the condition fails to improve as anticipated.  I provided 30 minutes of non-face-to-face time during this encounter.  The patient was located at home.  The provider was located at [facility].   Janet LOISE Sayers, NP   Subjective:   Patient ID:  Janet Bailey is a 78 y.o. (DOB 17-Mar-1946) female.  Chief Complaint: No chief complaint on file.   HPI Janet Bailey presents for follow-up of MDD, GAD and insomnia.  Accompanied by daughter.  Describes mood today as a little better. Pleasant. Reports decreased tearfulness. Mood symptoms - reports depression and anxiety. Reports some irritability. Reports varying interest and motivation. Reports working on crossword puzzles. Reports recent panic attacks - improved some - better than they were. Reports feeling less restless - I don't feel like I need to go. Reports decreased worry, rumination and over thinking - lots to think about - things on my plate. Reports ongoing health concerns.  Stating I feel like I'm doing a little bit better. Reports mood is improved. Stating I think I have improved some. Reports current medications are helpful. Energy levels  lower. Active, does not have a regular exercise routine.   Unable to enjoy usual interests and activities. Appetite adequate. Weight loss - 101 from 103 pounds.  Reports sleeping better some nights than others. Denies daytime napping.  Reports difficulties with focus and concentration - some. Reports getting nervous when she has to make a decision.  Denies SI or HI.  Denies AH or VH. Denies self harm.  Denies substance use.  Previous medication trials: Lexapro, Prozac , Elavil, Effexor, Cymbalta, Remeron, Zoloft, Depakote, Seroquel, Trintellix, Valium , Wellbutrin, Clonazepam , Olanzapine, Viibryd    Review of Systems:  Review of Systems  Musculoskeletal:  Negative for gait problem.  Neurological:  Negative for tremors.  Psychiatric/Behavioral:         Please refer to HPI    Medications: I have reviewed the patient's current medications.  Current Outpatient Medications  Medication Sig Dispense Refill   alum & mag hydroxide-simeth (MAALOX/MYLANTA) 200-200-20 MG/5ML suspension Take 15 mLs by mouth every 6 (six) hours as needed for indigestion or heartburn. 355 mL 0   Calcium  Carbonate-Vitamin D  600-200 MG-UNIT TABS Take 1 tablet by mouth daily. (Patient not taking: Reported on 04/12/2024)     cholecalciferol  (VITAMIN D ) 1000 UNITS tablet Take 1,000 Units by mouth 2 (two) times daily. (Patient not taking: Reported on 04/12/2024)     Cyanocobalamin  (B-12 PO) Take 1 tablet by mouth daily.     diazepam  (VALIUM ) 5 MG tablet Take 1 tablet (5 mg total) by mouth 3 (three) times daily as needed for anxiety. (Patient not taking: Reported on 04/12/2024) 90 tablet 0   diazepam  (VALIUM ) 5 MG tablet Take 1 tablet (5  mg total) by mouth 3 (three) times daily as needed for anxiety. 90 tablet 0   estradiol  (ESTRACE ) 0.1 MG/GM vaginal cream Place 1 g vaginally.     fluvoxaMINE  (LUVOX ) 50 MG tablet Take 1 tablet (50 mg total) by mouth at bedtime. 30 tablet 0   HYDROcodone -acetaminophen  (NORCO/VICODIN) 5-325 MG  tablet Take 1 tablet by mouth every 6 (six) hours as needed for moderate pain. 15 tablet 0   meclizine (ANTIVERT) 25 MG tablet Take 25 mg by mouth 3 (three) times daily as needed for dizziness. (Patient not taking: Reported on 04/12/2024)     methocarbamol  (ROBAXIN ) 500 MG tablet Take 500 mg by mouth every 8 (eight) hours as needed.     metoprolol  tartrate (LOPRESSOR ) 25 MG tablet Take 25 mg by mouth 2 (two) times daily.     omeprazole  (PRILOSEC) 20 MG capsule Take 20 mg by mouth in the morning and at bedtime.     ondansetron  (ZOFRAN -ODT) 4 MG disintegrating tablet DISSOLVE 1 TABLET IN MOUTH EVERY 8 HOURS AS NEEDED FOR NAUSEA FOR UP TO 7 DAYS     risperiDONE  (RISPERDAL ) 1 MG tablet Take 1 tablet (1 mg total) by mouth at bedtime. 30 tablet 0   sucralfate  (CARAFATE ) 1 GM/10ML suspension Take 10 mLs (1 g total) by mouth 3 (three) times daily. 420 mL 11   traMADol (ULTRAM) 50 MG tablet Take 50 mg by mouth every 6 (six) hours as needed.     No current facility-administered medications for this visit.    Medication Side Effects: None  Allergies:  Allergies  Allergen Reactions   Amoxicillin-Pot Clavulanate Diarrhea    Past Medical History:  Diagnosis Date   Arthritis    Colon polyps    Depression    Dysrhythmia    GERD (gastroesophageal reflux disease)    Headache    Hiatal hernia    Hypertension    TIA (transient ischemic attack)     Family History  Problem Relation Age of Onset   Aneurysm Mother    Heart disease Father    Colon polyps Sister    Arrhythmia Sister    Heart disease Sister    Colon polyps Brother    Colon cancer Paternal Uncle    Diabetes Daughter        One has diabetes age 85. Other in good health   Esophageal cancer Neg Hx    Rectal cancer Neg Hx    Stomach cancer Neg Hx     Social History   Socioeconomic History   Marital status: Widowed    Spouse name: Not on file   Number of children: 2   Years of education: Not on file   Highest education level:  Not on file  Occupational History   Occupation: retired  Tobacco Use   Smoking status: Never   Smokeless tobacco: Never  Vaping Use   Vaping status: Never Used  Substance and Sexual Activity   Alcohol use: No   Drug use: No   Sexual activity: Not Currently    Comment: 1st intercourse 44 yo-1 partner  Other Topics Concern   Not on file  Social History Narrative   ** Merged History Encounter **       Social Drivers of Health   Financial Resource Strain: Low Risk  (03/30/2024)   Received from [facility]   Overall Financial Resource Strain (CARDIA)    Difficulty of Paying Living Expenses: Not hard at all  Food Insecurity: No Food Insecurity (03/30/2024)  Received from [facility]   Hunger Vital Sign    Within the past 12 months, you worried that your food would run out before you got the money to buy more.: Never true    Within the past 12 months, the food you bought just didn't last and you didn't have money to get more.: Never true  Transportation Needs: No Transportation Needs (03/30/2024)   Received from [facility]   PRAPARE - Transportation    Lack of Transportation (Medical): No    Lack of Transportation (Non-Medical): No  Physical Activity: Inactive (03/30/2024)   Received from [facility]   Exercise Vital Sign    On average, how many days per week do you engage in moderate to strenuous exercise (like a brisk walk)?: 0 days    On average, how many minutes do you engage in exercise at this level?: 30 min  Stress: Stress Concern Present (03/30/2024)   Received from [facility] - Occupational Stress Questionnaire    Feeling of Stress : Very much  Social Connections: Somewhat Isolated (03/30/2024)   Received from [facility]   Social Network    How would you rate your social network (family, work, friends)?: Restricted participation with some degree of social isolation  Intimate Partner Violence: Not At Risk  (04/17/2024)   Received from [facility]   HITS    Over the last 12 months how often did your partner physically hurt you?: Never    Over the last 12 months how often did your partner insult you or talk down to you?: Never    Over the last 12 months how often did your partner threaten you with physical harm?: Never    Over the last 12 months how often did your partner scream or curse at you?: Never    Past Medical History, Surgical history, Social history, and Family history were reviewed and updated as appropriate.   Please see review of systems for further details on the patient's review from today.   Objective:   Physical Exam:  There were no vitals taken for this visit.  Physical Exam Constitutional:      General: She is not in acute distress.  Musculoskeletal:        General: No deformity.   Neurological:     Mental Status: She is alert and oriented to person, place, and time.     Coordination: Coordination normal.   Psychiatric:        Attention and Perception: Attention and perception normal. She does not perceive auditory or visual hallucinations.        Mood and Affect: Mood normal. Mood is not anxious or depressed. Affect is not labile, blunt, angry or inappropriate.        Speech: Speech normal.        Behavior: Behavior normal.        Thought Content: Thought content normal. Thought content is not paranoid or delusional. Thought content does not include homicidal or suicidal ideation. Thought content does not include homicidal or suicidal plan.        Cognition and Memory: Cognition and memory normal.        Judgment: Judgment normal.     Comments: Insight intact     Lab Review:     Component Value Date/Time   NA 133 (L) 02/10/2023 0437   K 3.2 (L) 02/10/2023 0437   CL 100 02/10/2023 0437   CO2 28 02/10/2023 0437  GLUCOSE 137 (H) 02/10/2023 0437   BUN 8 02/10/2023 0437   CREATININE 0.37 (L) 02/10/2023 0437   CREATININE 0.55 02/26/2014 1509   CALCIUM   8.2 (L) 02/10/2023 0437   PROT 5.3 (L) 07/29/2022 0857   ALBUMIN  3.0 (L) 07/29/2022 0857   AST 13 (L) 07/29/2022 0857   ALT 17 07/29/2022 0857   ALKPHOS 73 07/29/2022 0857   BILITOT 0.6 07/29/2022 0857   GFRNONAA >60 02/10/2023 0437       Component Value Date/Time   WBC 7.1 02/11/2023 0503   RBC 3.33 (L) 02/11/2023 0503   HGB 10.0 (L) 02/11/2023 0503   HCT 31.4 (L) 02/11/2023 0503   PLT 204 02/11/2023 0503   MCV 94.3 02/11/2023 0503   MCH 30.0 02/11/2023 0503   MCHC 31.8 02/11/2023 0503   RDW 13.2 02/11/2023 0503   LYMPHSABS 2.2 02/11/2023 0503   MONOABS 0.6 02/11/2023 0503   EOSABS 0.1 02/11/2023 0503   BASOSABS 0.0 02/11/2023 0503    No results found for: POCLITH, LITHIUM   No results found for: PHENYTOIN, PHENOBARB, VALPROATE, CBMZ   .res Assessment: Plan:    Treatment Plan/Recommendations:   D/C Viibyrd 10mg  daily  D/C Clonzepam 0.5mg  TID D/C Risperdal  0.5mg  at bedtime.  Valium  5mg  up to 3 times a day  Prozac  20mg  daily Seroquel 25mg  at hs  Using Melatonin as needed for sleep  RTC 4 weeks  30 minutes spent dedicated to the care of this patient on the date of this encounter to include pre-visit review of records, ordering of medication, post visit documentation, and face-to-face time with the patient discussing depression, anxiety and insomnia. Discussed continuing current medication regimen.  There are no diagnoses linked to this encounter.   Please see After Visit Summary for patient specific instructions.  Future Appointments  Date Time Provider Department Center  04/24/2024  3:30 PM Jelene Albano, Janet Mattocks, NP [facility] None  05/01/2024  1:00 PM Abran Norleen SAILOR, MD [facility]  05/02/2024  9:00 AM Sherlynn Sober, LCSW [facility] None  05/24/2024  2:00 PM Abran Norleen SAILOR, MD [facility]    No orders of the defined types were placed in this encounter.     -------------------------------

## 2024-05-01 ENCOUNTER — Encounter: Admitting: Internal Medicine

## 2024-05-02 ENCOUNTER — Ambulatory Visit: Admitting: Psychiatry

## 2024-05-02 DIAGNOSIS — F331 Major depressive disorder, recurrent, moderate: Secondary | ICD-10-CM | POA: Diagnosis not present

## 2024-05-02 NOTE — Progress Notes (Signed)
 [facility] Initial Adult Exam  Name: Amarachi Kotz Date: 05/02/2024 MRN: [id number] DOB: 01/30/1946 PCP: Suanne Pfeiffer, NP  Time spent: 60 minutes   Guardian/Payee:  patient    Paperwork requested:  no  Reason for Visit /Presenting Problem: depression, anxiety, nervousness, can't think too good, I shake a lot and don't know why, I sleep but don't feel rested, get up at odd hours of the night and can't go back to sleep, no friends and I stay home mostly, husband deceased for 3 yrs and states she was better before his death, always had trouble making decisions    Mental Status Exam:    Appearance:   Casual and Neat     Behavior:  Appropriate, Sharing, and not very motivated, my head is not right  Motor:  Some shakiness but walks  ok  Speech/Language:   Clear and Coherent  Affect:  Depressed and anxious  Mood:  anxious and depressed  Thought process:  I can't think good, my eyes and my head hurts  Thought content:    Rumination  Sensory/Perceptual disturbances:    WNL  Orientation:  oriented to stated year but missed the month and date  Attention:  Fair  Concentration:  Fair  Memory:  Some memory issues and hard to [employer] of knowledge:   Fair  Insight:    Fair  Judgment:   Some issues; hard to make decisions  Impulse Control:  Good   Reported Symptoms:  See notes above.   Risk Assessment: Danger to Self:  No Self-injurious Behavior: No Danger to Others: No Duty to Warn:no Physical Aggression / Violence:No  Access to Firearms a concern: No  Gang Involvement:No  Patient / guardian was educated about steps to take if suicide or homicide risk level increases between visits: yes While future psychiatric events cannot be accurately predicted, the patient does not currently require acute inpatient psychiatric care and does not currently meet [place]  involuntary commitment criteria.  Substance Abuse History: Current substance  abuse: No     Past Psychiatric History:   Previous psychological history is significant for anxiety and depression Outpatient Providers:([facility]s- [facility]) History of Psych Hospitalization: Yes  Psychological Testing: while inpatient but states she doesn't reall where   Abuse History: Victim of No., no   Report needed: No. Victim of Neglect:No. Perpetrator of n/a  Witness / Exposure to Domestic Violence: No   Protective Services Involvement: No  Witness to [employer] Violence:  No   Family History: patient confirms info below as much as she is able Family History  Problem Relation Age of Onset   Aneurysm Mother    Heart disease Father    Colon polyps Sister    Arrhythmia Sister    Heart disease Sister    Colon polyps Brother    Colon cancer Paternal Uncle    Diabetes Daughter        One has diabetes age 79. Other in good health   Esophageal cancer Neg Hx    Rectal cancer Neg Hx    Stomach cancer Neg Hx     Living situation: the patient lives with their daughter  Sexual Orientation:  Straight  Relationship Status: widowed  Name of spouse / other:  spouse deceased as of 3 yrs ago             If a parent, number of children / ages:2 daughters ages 6 and 16, 3 grandchildren (2 girls and 1 boy and are all are  graduated from college but states she's not real close to them now)  [employer]; adult daughters, used to have friends at [employer] but doesn't go now  Surveyor, quantity Stress:  No   Income/Employment/Disability: Neurosurgeon: No   Educational History: Education: high school diploma/GED  Religion/Sprituality/World View:   Protestant  Any cultural differences that may affect / interfere with treatment:  not applicable   Recreation/Hobbies: I used to read and work in yard but I don't feel like doing them now. I do watch some TV.   Stressors:Health problems   Loss of husband 3 yrs ago    Strengths:  Family, Spirituality, and  hopeful sometimes  Barriers:  not sure, my living conditions and living out of a Marine scientist History: Pending legal issue / charges: The patient has no significant history of legal issues. History of legal issue / charges: none reported  Medical History/Surgical History:reviewed with patient and she confirmed info below. Past Medical History:  Diagnosis Date   Arthritis    Colon polyps    Depression    Dysrhythmia    GERD (gastroesophageal reflux disease)    Headache    Hiatal hernia    Hypertension    TIA (transient ischemic attack)     Past Surgical History:  Procedure Laterality Date   ABDOMINAL HYSTERECTOMY     ABLATION     neuro- burned a nerve in my back   cataract surgery     CHOLECYSTECTOMY     HERNIA REPAIR     HIATAL HERNIA REPAIR N/A 02/09/2023   Procedure: LAPAROSCOPIC HIATAL HERNIA REPAIR; UPPER ENDOSCOPY;  Surgeon: Gladis Cough, MD;  Location: [facility];  Service: General;  Laterality: N/A;   INGUINAL HERNIA REPAIR     PACEMAKER IMPLANT     tension free transvaginal tape procedure      Medications: Current Outpatient Medications  Medication Sig Dispense Refill   alum & mag hydroxide-simeth (MAALOX/MYLANTA) 200-200-20 MG/5ML suspension Take 15 mLs by mouth every 6 (six) hours as needed for indigestion or heartburn. 355 mL 0   Calcium  Carbonate-Vitamin D  600-200 MG-UNIT TABS Take 1 tablet by mouth daily. (Patient not taking: Reported on 04/12/2024)     cholecalciferol  (VITAMIN D ) 1000 UNITS tablet Take 1,000 Units by mouth 2 (two) times daily. (Patient not taking: Reported on 04/12/2024)     Cyanocobalamin  (B-12 PO) Take 1 tablet by mouth daily.     diazepam  (VALIUM ) 5 MG tablet Take 1 tablet (5 mg total) by mouth 3 (three) times daily as needed for anxiety. (Patient not taking: Reported on 04/12/2024) 90 tablet 0   diazepam  (VALIUM ) 5 MG tablet Take 1 tablet (5 mg total) by mouth 3 (three) times daily as needed for anxiety. 90 tablet 0   estradiol   (ESTRACE ) 0.1 MG/GM vaginal cream Place 1 g vaginally.     fluvoxaMINE  (LUVOX ) 50 MG tablet Take 1 tablet (50 mg total) by mouth at bedtime. 30 tablet 0   HYDROcodone -acetaminophen  (NORCO/VICODIN) 5-325 MG tablet Take 1 tablet by mouth every 6 (six) hours as needed for moderate pain. 15 tablet 0   meclizine (ANTIVERT) 25 MG tablet Take 25 mg by mouth 3 (three) times daily as needed for dizziness. (Patient not taking: Reported on 04/12/2024)     methocarbamol  (ROBAXIN ) 500 MG tablet Take 500 mg by mouth every 8 (eight) hours as needed.     metoprolol  tartrate (LOPRESSOR ) 25 MG tablet Take 25 mg by mouth 2 (two) times daily.  omeprazole  (PRILOSEC) 20 MG capsule Take 20 mg by mouth in the morning and at bedtime.     ondansetron  (ZOFRAN -ODT) 4 MG disintegrating tablet DISSOLVE 1 TABLET IN MOUTH EVERY 8 HOURS AS NEEDED FOR NAUSEA FOR UP TO 7 DAYS     risperiDONE  (RISPERDAL ) 1 MG tablet Take 1 tablet (1 mg total) by mouth at bedtime. 30 tablet 0   sucralfate  (CARAFATE ) 1 GM/10ML suspension Take 10 mLs (1 g total) by mouth 3 (three) times daily. 420 mL 11   traMADol (ULTRAM) 50 MG tablet Take 50 mg by mouth every 6 (six) hours as needed.     No current facility-administered medications for this visit.    Allergies  Allergen Reactions   Amoxicillin-Pot Clavulanate Diarrhea    Diagnoses:    ICD-10-CM   1. Major depressive disorder, recurrent episode, moderate (HCC)  F33.1      Treatment goal plan of care: Worked with patient collaboratively on her treatment plan and she is in agreement with that.  Her goals will remain on treatment plan as patient works with strategies in sessions and outside of sessions to meet her goals.  Patient is signing a copy of her printed treatment goal plan instead of signing online.  Progress will be assessed each session and documented in the progress or plan sections of treatment note.  In working on her goals, patient chose goals targeting her depression and  stated below: 1: Identify life conflicts and/or situations including from the past and present that support current symptomology of depression. 2.  Develop behavioral and cognitive strategies to reduce or eliminate her depression.  Identify, challenge, and replace negative self talk with more positive, realistic, and empowering self talk. 3.  Patient will work on developing reality based, positive, cognitive messages that can help her improve her mood and outlook while also working on building self-confidence and the changes that she is trying to make.  Plan of Care:   Today is the first appointment with this patient for therapy and we collaboratively completed her initial evaluation and initial treatment goal plan.  Chaniece Barbato is a 78 year old widowed female.  Patient was brought to appointment by one of her adult daughters with whom she is currently staying part of the time, and also staying some with another adult daughter.  Patient shared that she has sold her bigger house after her husband died and is not sure if she will eventually be going into an apartment or buy a small house.  She is hoping for some type of arrangement for herself as it is difficult to keep switching between the 2 daughters.  Patient states that her family wanted her to be seen because she has a lot of nervousness and depression which has worsened after the death of her husband 3 years ago, who died of Lewy body dementia.  Patient is the oldest of the children born to her parents which also included 2 brothers and 3 sisters, all of whom are living and all depend on me as I was like and mama to them when our mama died.  Patient is sad and her affect today but also quite nervous and difficult for her to stay on track in her conversation, and shares that this happens often with other people, although does not have many friends with which she stays in contact.SABRA Erminio denies any thoughts to harm herself.  She is concerned about  having to bounce between the homes of her 2 adult daughters and comes  back to this subject several times during our talking today.  This also happens with other topics that she repeatedly focuses on.  She reports her interests as being house and yard work, does not like to talk on the phone but does call her brothers and sisters, adding that even though they live close by we do not visit each other.  She did mention that she does not like living with dogs in the house as I am not used to that over the years but also added that the dogs are friendly dogs, but just does not like them living inside.  Crystina shares that she wants to be able to be like I used to be which she explains includes making decisions, thinking better, not being so nervous all the time, not always thinking about the negatives, which was very insightful for patient as she named these areas to work on with very little help from therapist.  Tearful intermittently throughout session.  Had noticeable difficulties hearing, but therapist kept increasing the volume when I spoke and that seemed to help.  States she had hearing aids previously but she did not like him and stopped using them.  Mentions today that she thought she might like a different kind and said she would follow-up on checking on these with her daughter.  I explained to her that I hope she will follow through because not being able to hear other people well can certainly impact how comfortable she feels around them, how comfortable or uncomfortable she might be and asking people to repeat constantly, and how it impacts her not really hearing clearly what others are saying at times.  She did participate (as well as she could) in session today including us  talking about her treatment goals which are stated above.  She did show some motivation particularly in morning to not feel as depressed.  I gave patient a printed copy of her treatment goals for her to look over and think about  more in between now and next session.  Additional information gathered during this initial evaluation that help support patient's need for therapy can be found in the above sections of this evaluation.  Initial treatment goal review and patient is in agreement.  To return for next appointment within 2 weeks.   Barnie Bunde, LCSW

## 2024-05-02 NOTE — Progress Notes (Deleted)
      [facility] Counselor/Therapist Progress Note  Patient ID: Janet Bailey, MRN: [id number],    Date: 05/02/2024  Time Spent: ***   Treatment Type: {CHL AMB THERAPY TYPES:[id number]}  Reported Symptoms: ***  Mental Status Exam:  Appearance:   {PSY:22683}     Behavior:  {PSY:21022743}  Motor:  {PSY:22302}  Speech/Language:   {PSY:22685}  Affect:  {PSY:22687}  Mood:  {PSY:31886}  Thought process:  {PSY:31888}  Thought content:    {PSY:616-572-0306}  Sensory/Perceptual disturbances:    {PSY:[id number]}  Orientation:  {PSY:30297}  Attention:  {PSY:22877}  Concentration:  {PSY:703-275-9231}  Memory:  {PSY:854 051 9632}  Fund of knowledge:   {PSY:703-275-9231}  Insight:    {PSY:703-275-9231}  Judgment:   {PSY:703-275-9231}  Impulse Control:  {PSY:703-275-9231}   Risk Assessment: Danger to Self:  {PSY:22692} Self-injurious Behavior: {PSY:22692} Danger to Others: {PSY:22692} Duty to Warn:{PSY:311194} Physical Aggression / Violence:{PSY:21197} Access to Firearms a concern: {PSY:21197} Gang Involvement:{PSY:21197}  Subjective: ***   Interventions: {PSY:928-231-9968}  Diagnosis:No diagnosis found.  Plan: ***  Barnie Bunde, LCSW

## 2024-05-12 ENCOUNTER — Telehealth: Payer: Self-pay | Admitting: Adult Health

## 2024-05-12 NOTE — Telephone Encounter (Signed)
 Next appt is 05/24/24. Janet Bailey's daughter Janet Bailey called. Per Janet Janet Bailey was taking 25 mg. Then she was taking 100 mg and then went to 50 mg. [place] and [place] refill on Seroquel  50 mg called to [facility], [place]. Janet Bailey needs refill for fluvoxamine  20 mg and Seroquel  50 mg. Pharmacy [facility] in [place], [place], [address], [address] Phone number is [phone].

## 2024-05-14 ENCOUNTER — Other Ambulatory Visit: Payer: Self-pay | Admitting: Adult Health

## 2024-05-14 DIAGNOSIS — F331 Major depressive disorder, recurrent, moderate: Secondary | ICD-10-CM

## 2024-05-14 DIAGNOSIS — F411 Generalized anxiety disorder: Secondary | ICD-10-CM

## 2024-05-14 NOTE — Telephone Encounter (Signed)
 LVM to verify medications and doses.

## 2024-05-16 MED ORDER — QUETIAPINE FUMARATE 50 MG PO TABS: 50.0000 mg | ORAL_TABLET | Freq: Every day | ORAL | 0 refills | Status: DC

## 2024-05-16 NOTE — Telephone Encounter (Signed)
 Rock called back and I reviewed what RF she needed. Patient is not on fluvoxamine , is on fluoxetine . Needed RF of Seroquel  50 mg also. RF sent to pharmacy in [facility]. Daughter goes back and forth between daughters so I only send in a 30-day supply at a time.

## 2024-05-16 NOTE — Telephone Encounter (Signed)
 Left second VM to RC.

## 2024-05-17 ENCOUNTER — Ambulatory Visit: Admitting: Psychiatry

## 2024-05-17 DIAGNOSIS — F331 Major depressive disorder, recurrent, moderate: Secondary | ICD-10-CM

## 2024-05-17 NOTE — Progress Notes (Signed)
     Patient had difficulty signing in online.  Worked to help her be able to sign in.  Was too late to start session as another patinet was due within 30 minutes so we rescheduled this patient for tomorrow at 2:00pm.

## 2024-05-18 ENCOUNTER — Ambulatory Visit: Admitting: Psychiatry

## 2024-05-18 DIAGNOSIS — F331 Major depressive disorder, recurrent, moderate: Secondary | ICD-10-CM

## 2024-05-18 NOTE — Progress Notes (Signed)
 [facility] Counselor/Therapist Progress Note  Patient ID: Janet Bailey, MRN: [id number],    Date: 05/18/2024  Time Spent: 50 minutes   Treatment Type: Individual Therapy  Virtual Visit via Telehealth Note: MyChart Video session Connected with patient by a telemedicine/telehealth application, with their informed consent, and verified patient privacy and that I am speaking with the correct person using two identifiers. I discussed the limitations, risks, security and privacy concerns of performing psychotherapy and the availability of in person appointments. I also discussed with the patient that there may be a patient responsible charge related to this service. The patient expressed understanding and agreed to proceed. I discussed the treatment planning with the patient. The patient was provided an opportunity to ask questions and all were answered. The patient agreed with the plan and demonstrated an understanding of the instructions. The patient was advised to call  our office if  symptoms worsen or feel they are in a crisis state and need immediate contact.   Therapist Location: office Patient Location: home   Reported Symptoms: Depression, nervousness, anxiety, I shake a lot and I do not know why, I sleep but do not feel rested, cannot think too good, gets up at odd hours of the night and cannot go back to sleep, has been deceased for 3 years and states that she was better before his death, has always had trouble making decisions.   Mental Status Exam:  Appearance:   Casual     Behavior:  Appropriate and Sharing  Motor:  Normal  Speech/Language:   Clear and Coherent  Affect:  Depressed and anxious  Mood:  anxious and depressed  Thought process:  goal directed  Thought content:    Rumination  Sensory/Perceptual disturbances:    WNL  Orientation:  oriented to person, place, time/date, situation, day of week, month of year, year, and stated date of May 18, 2024   Attention:  Fair  Concentration:  Fair  Memory:  Some memory issues reported  Fund of knowledge:   Fair  Insight:    Poor  Judgment:   Fair  Impulse Control:  Good   Risk Assessment: Danger to Self:  No Self-injurious Behavior: No Danger to Others: No Duty to Warn:no Physical Aggression / Violence:No  Access to Firearms a concern: No  Gang Involvement:No   Subjective: Patient, 78 yr old widowed female and staying some with her 2 daughters after the loss of her husband and trying to figure out next steps moving forward as to where she will be living.  Very anxious, hard to focus on anything else other than her anxiety, talks almost nonstop and typically is focused on worries.  Very hard to console her but did try to be supportive and offer her some hope for the future even though she is really struggling right now.  She has been placed on medication and has been on that for a few weeks per her report but not sure it is helping her again according to her report.  Patient added again that she has hearing aids but she stopped using them because she did not like him.  Her hearing problems do persist.  Patient participated well in session but pretty much was repeating herself multiple times about her anxiety and her fears and a lot of worrying.  She currently is going back and forth living with the 2 daughters.  Tried to encourage patient more near the end of session about trying not to assume worst-case  scenarios and that things are going to get worse.  Very hard for patient to hold onto anything that is said to her but I am hoping with more time on her medication that may be things will calm down for her and we can work in a more concentrated way on some goals.  Try to encourage her in some activities such as reading or watching TV which she had told me previously she enjoyed but she feels too anxious to do so per her report.  Interventions: Cognitive Behavioral Therapy,  Solution-Oriented/Positive Psychology, and Ego-Supportive 1.  Identify life conflicts, struggles, and/or situations including from the past and present that support current symptomology of depression. 2.  Develop behavioral and cognitive strategies to reduce or eliminate her depression.  Identify, challenge, and replace negative self talk with more positive, realistic, and empowering self talk. 3.  Patient will work on developing reality based, positive, cognitive messages that can help her improve her mood and outlook while building self-confidence and work on the changes that she is trying to make, including some acceptance.  Diagnosis:   ICD-10-CM   1. Major depressive disorder, recurrent episode, moderate (HCC)  F33.1      Plan: As noted above, patient was very anxious today and had some difficulty within the session but tried to focus on helping her calm down, and paying attention to what helps versus what aggravates her anxiety and depression.  Denied any thoughts to harm herself.  Very anxious over where she is going to end of living, if she is going need to move, and going back and forth between living with her 2 daughters.  Will talk with patient again within 2 to 3 weeks but want to talk with her med provider before talking with patient again.  Goal review and progress/challenges noted with patient.  Next appointment within 2 to 3 weeks.   Barnie Bunde, LCSW

## 2024-05-24 ENCOUNTER — Telehealth: Admitting: Adult Health

## 2024-05-24 ENCOUNTER — Ambulatory Visit: Admitting: Internal Medicine

## 2024-05-24 ENCOUNTER — Encounter: Payer: Self-pay | Admitting: Adult Health

## 2024-05-24 DIAGNOSIS — F331 Major depressive disorder, recurrent, moderate: Secondary | ICD-10-CM

## 2024-05-24 DIAGNOSIS — G4700 Insomnia, unspecified: Secondary | ICD-10-CM | POA: Diagnosis not present

## 2024-05-24 DIAGNOSIS — F411 Generalized anxiety disorder: Secondary | ICD-10-CM

## 2024-05-24 NOTE — Progress Notes (Signed)
 Janet Bailey [id number] 24-Dec-1945 78 y.o.  Virtual Visit via Video Note  I connected with pt @ on 05/24/24 at  4:00 PM EDT by a video enabled telemedicine application and verified that I am speaking with the correct person using two identifiers.   I discussed the limitations of evaluation and management by telemedicine and the availability of in person appointments. The patient expressed understanding and agreed to proceed.  I discussed the assessment and treatment plan with the patient. The patient was provided an opportunity to ask questions and all were answered. The patient agreed with the plan and demonstrated an understanding of the instructions.   The patient was advised to call back or seek an in-person evaluation if the symptoms worsen or if the condition fails to improve as anticipated.  I provided 30 minutes of non-face-to-face time during this encounter.  The patient was located at home.  The provider was located at [facility].   Angeline LOISE Sayers, NP   Subjective:   Patient ID:  Janet Bailey is a 78 y.o. (DOB [birth year]) female.  Chief Complaint: No chief complaint on file.   HPI Shawndell Varas presents for follow-up of MDD, GAD and insomnia.  Accompanied by daughter.  Describes mood today as not good. Pleasant. Reports increased tearfulness - more so today. Mood symptoms - reports depression and anxiety. Reports some irritability. Reports decreased interest and motivation. Reports recent panic attacks - having right many of them. Reports feeling restless - I can't settle myself down. Reports some worry, rumination and over thinking. Reports ongoing health concerns. Reports mood is lower. Stating I'm not doing too good. Reports Seroquel  is making her feel sick on her stomach and anxious. She is willing to consider other options.   Energy levels lower. Active, does not have a regular exercise routine.   Unable to enjoy usual interests  and activities. Appetite adequate. Weight stable - 102 pounds.  Reports sleeping better some nights than others. Denies daytime napping - laying down.  Reports improved focus and concentration - doing pretty good with that. Trying to complete tasks for daughters - letting dogs out.  Denies SI or HI.  Denies AH or VH. Denies self harm.  Denies substance use.  Previous medication trials: Lexapro, Prozac , Elavil, Effexor, Cymbalta, Remeron, Zoloft, Depakote, Seroquel , Trintellix, Valium , Wellbutrin, Clonazepam , Olanzapine, Viibryd   Review of Systems:  Review of Systems  Musculoskeletal:  Negative for gait problem.  Neurological:  Negative for tremors.  Psychiatric/Behavioral:         Please refer to HPI    Medications: I have reviewed the patient's current medications.  Current Outpatient Medications  Medication Sig Dispense Refill   alum & mag hydroxide-simeth (MAALOX/MYLANTA) 200-200-20 MG/5ML suspension Take 15 mLs by mouth every 6 (six) hours as needed for indigestion or heartburn. 355 mL 0   Calcium  Carbonate-Vitamin D  600-200 MG-UNIT TABS Take 1 tablet by mouth daily. (Patient not taking: Reported on 04/12/2024)     cholecalciferol  (VITAMIN D ) 1000 UNITS tablet Take 1,000 Units by mouth 2 (two) times daily. (Patient not taking: Reported on 04/12/2024)     Cyanocobalamin  (B-12 PO) Take 1 tablet by mouth daily.     diazepam  (VALIUM ) 5 MG tablet Take 1 tablet (5 mg total) by mouth 3 (three) times daily as needed for anxiety. (Patient not taking: Reported on 04/12/2024) 90 tablet 0   diazepam  (VALIUM ) 5 MG tablet Take 1 tablet (5 mg total) by mouth 3 (three) times daily as needed for  anxiety. 90 tablet 0   estradiol  (ESTRACE ) 0.1 MG/GM vaginal cream Place 1 g vaginally.     FLUoxetine  (PROZAC ) 20 MG capsule Take 1 capsule by mouth once daily 30 capsule 0   HYDROcodone -acetaminophen  (NORCO/VICODIN) 5-325 MG tablet Take 1 tablet by mouth every 6 (six) hours as needed for moderate pain. 15  tablet 0   meclizine (ANTIVERT) 25 MG tablet Take 25 mg by mouth 3 (three) times daily as needed for dizziness. (Patient not taking: Reported on 04/12/2024)     metoprolol  tartrate (LOPRESSOR ) 25 MG tablet Take 25 mg by mouth 2 (two) times daily.     omeprazole  (PRILOSEC) 20 MG capsule Take 20 mg by mouth in the morning and at bedtime.     ondansetron  (ZOFRAN -ODT) 4 MG disintegrating tablet DISSOLVE 1 TABLET IN MOUTH EVERY 8 HOURS AS NEEDED FOR NAUSEA FOR UP TO 7 DAYS     QUEtiapine  (SEROQUEL ) 50 MG tablet Take 1 tablet (50 mg total) by mouth at bedtime. 30 tablet 0   sucralfate  (CARAFATE ) 1 GM/10ML suspension Take 10 mLs (1 g total) by mouth 3 (three) times daily. 420 mL 11   traMADol (ULTRAM) 50 MG tablet Take 50 mg by mouth every 6 (six) hours as needed.     No current facility-administered medications for this visit.    Medication Side Effects: None  Allergies:  Allergies  Allergen Reactions   Amoxicillin-Pot Clavulanate Diarrhea    Past Medical History:  Diagnosis Date   Arthritis    Colon polyps    Depression    Dysrhythmia    GERD (gastroesophageal reflux disease)    Headache    Hiatal hernia    Hypertension    TIA (transient ischemic attack)     Family History  Problem Relation Age of Onset   Aneurysm Mother    Heart disease Father    Colon polyps Sister    Arrhythmia Sister    Heart disease Sister    Colon polyps Brother    Colon cancer Paternal Uncle    Diabetes Daughter        One has diabetes age 15. Other in good health   Esophageal cancer Neg Hx    Rectal cancer Neg Hx    Stomach cancer Neg Hx     Social History   Socioeconomic History   Marital status: Widowed    Spouse name: Not on file   Number of children: 2   Years of education: Not on file   Highest education level: Not on file  Occupational History   Occupation: retired  Tobacco Use   Smoking status: Never   Smokeless tobacco: Never  Vaping Use   Vaping status: Never Used  Substance  and Sexual Activity   Alcohol use: No   Drug use: No   Sexual activity: Not Currently    Comment: 1st intercourse 72 yo-1 partner  Other Topics Concern   Not on file  Social History Narrative   ** Merged History Encounter **       Social Drivers of Health   Financial Resource Strain: Low Risk  (03/30/2024)   Received from [facility]   Overall Financial Resource Strain (CARDIA)    Difficulty of Paying Living Expenses: Not hard at all  Food Insecurity: No Food Insecurity (03/30/2024)   [facility] from [facility]   Hunger Vital Sign    Within the past 12 months, you worried that your food would run out before you got the money to buy more.:  Never true    Within the past 12 months, the food you bought just didn't last and you didn't have money to get more.: Never true  Transportation Needs: No Transportation Needs (03/30/2024)   Received from [facility]   PRAPARE - Transportation    Lack of Transportation (Medical): No    Lack of Transportation (Non-Medical): No  Physical Activity: Inactive (03/30/2024)   Received from [facility]   Exercise Vital Sign    On average, how many days per week do you engage in moderate to strenuous exercise (like a brisk walk)?: 0 days    On average, how many minutes do you engage in exercise at this level?: 30 min  Stress: Stress Concern Present (03/30/2024)   Received from [facility] - Occupational Stress Questionnaire    Feeling of Stress : Very much  Social Connections: Somewhat Isolated (03/30/2024)   Received from [facility]   Social Network    How would you rate your social network (family, work, friends)?: Restricted participation with some degree of social isolation  Intimate Partner Violence: Not At Risk (04/17/2024)   Received from [facility]   HITS    Over the last 12 months how often did your partner physically hurt you?: Never    Over the last 12 months how often did your partner  insult you or talk down to you?: Never    Over the last 12 months how often did your partner threaten you with physical harm?: Never    Over the last 12 months how often did your partner scream or curse at you?: Never    Past Medical History, Surgical history, Social history, and Family history were reviewed and updated as appropriate.   Please see review of systems for further details on the patient's review from today.   Objective:   Physical Exam:  There were no vitals taken for this visit.  Physical Exam Constitutional:      General: She is not in acute distress. Musculoskeletal:        General: No deformity.  Neurological:     Mental Status: She is alert and oriented to person, place, and time.     Coordination: Coordination normal.  Psychiatric:        Attention and Perception: Attention and perception normal. She does not perceive auditory or visual hallucinations.        Mood and Affect: Mood is anxious and depressed. Affect is not labile, blunt, angry or inappropriate.        Speech: Speech normal.        Behavior: Behavior normal.        Thought Content: Thought content normal. Thought content is not paranoid or delusional. Thought content does not include homicidal or suicidal ideation. Thought content does not include homicidal or suicidal plan.        Cognition and Memory: Cognition and memory normal.        Judgment: Judgment normal.     Comments: Insight intact     Lab Review:     Component Value Date/Time   NA 133 (L) 02/10/2023 0437   K 3.2 (L) 02/10/2023 0437   CL 100 02/10/2023 0437   CO2 28 02/10/2023 0437   GLUCOSE 137 (H) 02/10/2023 0437   BUN 8 02/10/2023 0437   CREATININE 0.37 (L) 02/10/2023 0437   CREATININE 0.55 02/26/2014 1509   CALCIUM  8.2 (L) 02/10/2023 0437   PROT 5.3 (L) 07/29/2022 9142  ALBUMIN  3.0 (L) 07/29/2022 0857   AST 13 (L) 07/29/2022 0857   ALT 17 07/29/2022 0857   ALKPHOS 73 07/29/2022 0857   BILITOT 0.6 07/29/2022 0857    GFRNONAA >60 02/10/2023 0437       Component Value Date/Time   WBC 7.1 02/11/2023 0503   RBC 3.33 (L) 02/11/2023 0503   HGB 10.0 (L) 02/11/2023 0503   HCT 31.4 (L) 02/11/2023 0503   PLT 204 02/11/2023 0503   MCV 94.3 02/11/2023 0503   MCH 30.0 02/11/2023 0503   MCHC 31.8 02/11/2023 0503   RDW 13.2 02/11/2023 0503   LYMPHSABS 2.2 02/11/2023 0503   MONOABS 0.6 02/11/2023 0503   EOSABS 0.1 02/11/2023 0503   BASOSABS 0.0 02/11/2023 0503    No results found for: POCLITH, LITHIUM   No results found for: PHENYTOIN, PHENOBARB, VALPROATE, CBMZ   .res Assessment: Plan:    Treatment Plan/Recommendations:   Valium  5mg  up to 3 times a day  Prozac  20mg  daily  D/C - Seroquel  50mg  at hs - making her sick - will review other options  Using Melatonin as needed for sleep  RTC 4 weeks  30 minutes spent dedicated to the care of this patient on the date of this encounter to include pre-visit review of records, ordering of medication, post visit documentation, and face-to-face time with the patient discussing depression, anxiety and insomnia. Discussed continuing current medication regimen.  Diagnoses and all orders for this visit:  Major depressive disorder, recurrent episode, moderate (HCC)  Generalized anxiety disorder  Insomnia, unspecified type     Please see After Visit Summary for patient specific instructions.  Future Appointments  Date Time Provider Department Center  05/31/2024  3:00 PM Sherlynn Sober, LCSW [facility] None    No orders of the defined types were placed in this encounter.     -------------------------------

## 2024-05-31 ENCOUNTER — Telehealth: Payer: Self-pay | Admitting: Adult Health

## 2024-05-31 ENCOUNTER — Ambulatory Visit: Admitting: Psychiatry

## 2024-05-31 DIAGNOSIS — Z0289 Encounter for other administrative examinations: Secondary | ICD-10-CM

## 2024-05-31 NOTE — Telephone Encounter (Signed)
 Called and spoke with patient's daughter. Daughter with mother at hospital. Daughter will call with follow up.

## 2024-06-14 ENCOUNTER — Telehealth: Payer: Self-pay | Admitting: Adult Health

## 2024-06-16 ENCOUNTER — Other Ambulatory Visit: Payer: Self-pay | Admitting: Adult Health

## 2024-07-19 ENCOUNTER — Ambulatory Visit: Admitting: Nurse Practitioner

## 2024-07-24 ENCOUNTER — Telehealth: Payer: Self-pay | Admitting: Internal Medicine

## 2024-07-24 NOTE — Telephone Encounter (Signed)
 Inbound call from patient daughter requesting to reschedule her mother's appointment that was cancelled due to the PA not being available that day. Pt daughter stated that she did not want to wait until October November with Dr. Abran PA and she does not want to wait until December to be seen either with Dr. Abran. Patient is requesting a call back. Please advise.

## 2024-07-24 NOTE — Telephone Encounter (Signed)
 Spoke to [place], patient's daughter who states that had previously scheduled an appointment for last week with APP but this was cancelled due to provider illness. States patient is still having daily nausea despite paraesophageal hernia repair. Wants to see Dr Abran again in follow up to discuss if there is anything that can be done from a GI perspective for this. Appointment scheduled for 08/04/24.

## 2024-08-04 ENCOUNTER — Encounter: Payer: Self-pay | Admitting: Internal Medicine

## 2024-08-04 ENCOUNTER — Ambulatory Visit: Admitting: Internal Medicine

## 2024-08-04 VITALS — BP 124/70 | HR 72 | Ht <= 58 in | Wt 104.0 lb

## 2024-08-04 DIAGNOSIS — R935 Abnormal findings on diagnostic imaging of other abdominal regions, including retroperitoneum: Secondary | ICD-10-CM

## 2024-08-04 DIAGNOSIS — K580 Irritable bowel syndrome with diarrhea: Secondary | ICD-10-CM

## 2024-08-04 DIAGNOSIS — K529 Noninfective gastroenteritis and colitis, unspecified: Secondary | ICD-10-CM | POA: Diagnosis not present

## 2024-08-04 DIAGNOSIS — R110 Nausea: Secondary | ICD-10-CM

## 2024-08-04 DIAGNOSIS — K219 Gastro-esophageal reflux disease without esophagitis: Secondary | ICD-10-CM | POA: Diagnosis not present

## 2024-08-04 DIAGNOSIS — R1031 Right lower quadrant pain: Secondary | ICD-10-CM

## 2024-08-04 DIAGNOSIS — K224 Dyskinesia of esophagus: Secondary | ICD-10-CM

## 2024-08-04 MED ORDER — NA SULFATE-K SULFATE-MG SULF 17.5-3.13-1.6 GM/177ML PO SOLN: 1.0000 | Freq: Once | ORAL | 0 refills | Status: AC

## 2024-08-04 MED ORDER — ONDANSETRON HCL 4 MG PO TABS: 4.0000 mg | ORAL_TABLET | Freq: Two times a day (BID) | ORAL | 3 refills | Status: AC

## 2024-08-04 NOTE — Patient Instructions (Addendum)
 We have sent the following medications to your pharmacy for you to pick up at your convenience:  Zofran    You have been scheduled for a colonoscopy. Please follow written instructions given to you at your visit today.   If you use inhalers (even only as needed), please bring them with you on the day of your procedure.  DO NOT TAKE 7 DAYS PRIOR TO TEST- Trulicity (dulaglutide) Ozempic, Wegovy (semaglutide) Mounjaro (tirzepatide) Bydureon Bcise (exanatide extended release)  DO NOT TAKE 1 DAY PRIOR TO YOUR TEST Rybelsus (semaglutide) Adlyxin (lixisenatide) Victoza (liraglutide) Byetta (exanatide) ___________________________________________________________________________   _______________________________________________________  If your blood pressure at your visit was 140/90 or greater, please contact your primary care physician to follow up on this.  _______________________________________________________  If you are age 79 or older, your body mass index should be between 23-30. Your Body mass index is 23.32 kg/m. If this is out of the aforementioned range listed, please consider follow up with your Primary Care Provider.  If you are age 79 or younger, your body mass index should be between 19-25. Your Body mass index is 23.32 kg/m. If this is out of the aformentioned range listed, please consider follow up with your Primary Care Provider.   ________________________________________________________  The [facility] providers would like to encourage you to use MYCHART to communicate with providers for non-urgent requests or questions.  Due to long hold times on the telephone, sending your provider a message by [facility] may be a faster and more efficient way to get a response.  Please allow 48 business hours for a response.  Please remember that this is for non-urgent requests.  _______________________________________________________  [facility] is using a team-based  approach to care.  Your team is made up of your doctor and two to three APPS. Our APPS (Nurse Practitioners and Physician Assistants) work with your physician to ensure care continuity for you. They are fully qualified to address your health concerns and develop a treatment plan. They communicate directly with your gastroenterologist to care for you. Seeing the Advanced Practice Practitioners on your physician's team can help you by facilitating care more promptly, often allowing for earlier appointments, access to diagnostic testing, procedures, and other specialty referrals.

## 2024-08-04 NOTE — Progress Notes (Signed)
 HISTORY OF PRESENT ILLNESS:  Janet Bailey is a 78 y.o. female with multiple medical problems as listed below as well as chronic anxiety/depression.  Patient has had chronic GI complaints for many years including functional abdominal pain and irritable bowel syndrome (diarrhea predominant).  She has a history of symptomatic paraesophageal hernia s/p repair.  Chronic esophageal dysmotility.  Ongoing issues with diarrhea.  Previous colonoscopy elsewhere for screening 2012 revealed a diminutive polyp.  Last seen in this office in February 10, 2024 regarding GERD, weight loss, abnormal CT scan with thickening of the left colon and sigmoid region, and chronic diarrhea.  Thereafter, I saw the patient in the endoscopy suite Mar 02, 2024 when she underwent upper endoscopy with esophageal dilation of the distal stenosis.  Exam was otherwise unremarkable including intact hiatal hernia repair.  She was prescribed Carafate  slurry and continued on omeprazole .  Asked to follow-up in 10 to 12 weeks.  Thereafter, hospitalized in [place] for 1 week with severe anxiety and other issues.  Reviewed.  She is accompanied today by her grandson.  Her daughter Rock joins us  by phone.  The issues brought forward today are the following:  1.  Patient has difficulty drinking water.  However, no difficulty drinking other items such as tea. 2.  Chronic nausea, particular in the morning.  Zofran  helps, though she only has 20 or 30/month available 3.  Ongoing chronic diarrhea.  Imodium helps.  Tolerates Imodium well.  May take up to 3/day.  Will have days without a bowel movement after taking Imodium.  They are wondering if there are other treatments for her diarrhea, or if Imodium and acceptable treatment. 4.  Chronic intermittent right lower quadrant pain 5.  Occasional incontinence 6.  Questions regarding colonoscopy that has been scheduled for October 22  Patient tells me that the esophageal dilation did help her  swallowing somewhat.  Issues swallowing water as described.  Right-sided pain is intermittent.  No exacerbating or relieving factors.  No longer taking Vicodin or tramadol.  Takes Toradol.  CT scan January 2025.  REVIEW OF SYSTEMS:  All non-GI ROS negative unless otherwise stated in the HPI except for anxiety, arthritis, back pain, visual change, confusion, depression, fatigue, headaches, hearing problems, muscle cramps, night sweats, sleeping problems, sore throat, urinary leakage, shortness of breath  Past Medical History:  Diagnosis Date   Arthritis    Colon polyps    Depression    Dysrhythmia    GERD (gastroesophageal reflux disease)    Headache    Hiatal hernia    Hypertension    TIA (transient ischemic attack)     Past Surgical History:  Procedure Laterality Date   ABDOMINAL HYSTERECTOMY     ABLATION     neuro- burned a nerve in my back   cataract surgery     CHOLECYSTECTOMY     HERNIA REPAIR     HIATAL HERNIA REPAIR N/A 02/09/2023   Procedure: LAPAROSCOPIC HIATAL HERNIA REPAIR; UPPER ENDOSCOPY;  Surgeon: Gladis Cough, MD;  Location: [facility];  Service: General;  Laterality: N/A;   INGUINAL HERNIA REPAIR     PACEMAKER IMPLANT     tension free transvaginal tape procedure      Social History Sierrah Luevano  reports that she has never smoked. She has never used smokeless tobacco. She reports that she does not drink alcohol and does not use drugs.  family history includes Aneurysm in her mother; Arrhythmia in her sister; Colon cancer in her paternal uncle; Colon  polyps in her brother and sister; Diabetes in her daughter; Heart disease in her father and sister.  Allergies  Allergen Reactions   Amoxicillin-Pot Clavulanate Diarrhea       PHYSICAL EXAMINATION: Vital signs: BP 124/70   Pulse 72   Ht 4' 8 (1.422 m)   Wt 104 lb (47.2 kg)   BMI 23.32 kg/m   Constitutional: Frail thin elderly female, no acute distress Psychiatric: alert and oriented x3,  cooperative.  Slightly flat affect Eyes: extraocular movements intact, anicteric, conjunctiva pink Mouth: oral pharynx moist, no lesions Neck: supple no lymphadenopathy Cardiovascular: heart regular rate and rhythm, no murmur Lungs: clear to auscultation bilaterally Abdomen: soft, nontender, nondistended, no obvious ascites, no peritoneal signs, normal bowel sounds, no organomegaly Rectal: Deferred until colonoscopy Extremities: no clubbing, cyanosis, or lower extremity edema bilaterally Skin: no lesions on visible extremities Neuro: No focal deficits.  Cranial nerves intact  ASSESSMENT:  1.  Chronic GERD. 2.  History of symptomatic paraesophageal hernia status post repair 3.  Esophageal stricture status post recent EGD with dilation.  Improvement in some elements of swallowing thereafter. 4.  Chronic diarrhea.  Presumably IBS.  Rule out other entities. 5.  CT scan with thickening of the left colon.  Rule out colitis. 6.  Chronic intermittent right sided pain.  Etiology uncertain. 7.  Chronic nausea 8.  Esophageal dysmotility 9.  Multiple general medical problems and behavioral health issues  PLAN:  1.  Continue PPI 2.  Chew food well 3.  Okay to use Imodium for diarrhea 4.  Scheduling colonoscopy with biopsies to evaluate CT scan abnormality and chronic diarrhea.The nature of the procedure, as well as the risks, benefits, and alternatives were carefully and thoroughly reviewed with the patient. Ample time for discussion and questions allowed. The patient understood, was satisfied, and agreed to proceed. 5.  Prescribe Zofran  4 mg.  1 p.o. twice daily as needed.  #60.  Refills.  Recommended taking upon wakening to see if this might thwart her nausea. 6.  Discussion on esophageal dysmotility 7.  Ongoing general medical care with PCP and other specialists Total time of 60 minutes was spent preparing to see the patient, obtaining interval history, reviewing outside records, performing  medically appropriate physical exam, counseling and educating the patient and her family regarding multiple above listed issues, ordering medication, ordering colonoscopy, and documenting clinical information in the health record

## 2024-08-11 ENCOUNTER — Encounter: Payer: Self-pay | Admitting: Internal Medicine

## 2024-08-16 ENCOUNTER — Encounter (INDEPENDENT_AMBULATORY_CARE_PROVIDER_SITE_OTHER): Payer: Self-pay | Admitting: Gastroenterology

## 2024-08-16 ENCOUNTER — Encounter: Payer: Self-pay | Admitting: Internal Medicine

## 2024-08-23 ENCOUNTER — Encounter: Payer: Self-pay | Admitting: Internal Medicine

## 2024-08-23 ENCOUNTER — Ambulatory Visit (AMBULATORY_SURGERY_CENTER): Admitting: Internal Medicine

## 2024-08-23 VITALS — BP 114/87 | HR 72 | Temp 98.1°F | Resp 15 | Ht <= 58 in | Wt 104.0 lb

## 2024-08-23 DIAGNOSIS — R197 Diarrhea, unspecified: Secondary | ICD-10-CM

## 2024-08-23 DIAGNOSIS — D125 Benign neoplasm of sigmoid colon: Secondary | ICD-10-CM

## 2024-08-23 DIAGNOSIS — K529 Noninfective gastroenteritis and colitis, unspecified: Secondary | ICD-10-CM

## 2024-08-23 DIAGNOSIS — R1031 Right lower quadrant pain: Secondary | ICD-10-CM

## 2024-08-23 DIAGNOSIS — K580 Irritable bowel syndrome with diarrhea: Secondary | ICD-10-CM

## 2024-08-23 DIAGNOSIS — K5730 Diverticulosis of large intestine without perforation or abscess without bleeding: Secondary | ICD-10-CM

## 2024-08-23 DIAGNOSIS — R935 Abnormal findings on diagnostic imaging of other abdominal regions, including retroperitoneum: Secondary | ICD-10-CM

## 2024-08-23 MED ORDER — SODIUM CHLORIDE 0.9 % IV SOLN: 500.0000 mL | Freq: Once | INTRAVENOUS | Status: DC

## 2024-08-23 NOTE — Progress Notes (Addendum)
 [name]  Pt's states no medical or surgical changes since previsit or office visit.  Patient is very hard of hearing and she is very confused about her medications. Her daughter was brought back to evaluate her medications.

## 2024-08-23 NOTE — Progress Notes (Signed)
 Expand All Collapse All HISTORY OF PRESENT ILLNESS:   Janet Bailey is a 78 y.o. female with multiple medical problems as listed below as well as chronic anxiety/depression.  Patient has had chronic GI complaints for many years including functional abdominal pain and irritable bowel syndrome (diarrhea predominant).  She has a history of symptomatic paraesophageal hernia s/p repair.  Chronic esophageal dysmotility.  Ongoing issues with diarrhea.  Previous colonoscopy elsewhere for screening 2012 revealed a diminutive polyp.  Last seen in this office in February 10, 2024 regarding GERD, weight loss, abnormal CT scan with thickening of the left colon and sigmoid region, and chronic diarrhea.   Thereafter, I saw the patient in the endoscopy suite Mar 02, 2024 when she underwent upper endoscopy with esophageal dilation of the distal stenosis.  Exam was otherwise unremarkable including intact hiatal hernia repair.  She was prescribed Carafate  slurry and continued on omeprazole .  Asked to follow-up in 10 to 12 weeks.  Thereafter, hospitalized in [place] for 1 week with severe anxiety and other issues.  Reviewed.   She is accompanied today by her grandson.  Her daughter Rock joins us  by phone.  The issues brought forward today are the following:   1.  Patient has difficulty drinking water.  However, no difficulty drinking other items such as tea. 2.  Chronic nausea, particular in the morning.  Zofran  helps, though she only has 20 or 30/month available 3.  Ongoing chronic diarrhea.  Imodium helps.  Tolerates Imodium well.  May take up to 3/day.  Will have days without a bowel movement after taking Imodium.  They are wondering if there are other treatments for her diarrhea, or if Imodium and acceptable treatment. 4.  Chronic intermittent right lower quadrant pain 5.  Occasional incontinence 6.  Questions regarding colonoscopy that has been scheduled for October 22   Patient tells me that the esophageal  dilation did help her swallowing somewhat.  Issues swallowing water as described.  Right-sided pain is intermittent.  No exacerbating or relieving factors.  No longer taking Vicodin or tramadol.  Takes Toradol.  CT scan January 2025.   REVIEW OF SYSTEMS:   All non-GI ROS negative unless otherwise stated in the HPI except for anxiety, arthritis, back pain, visual change, confusion, depression, fatigue, headaches, hearing problems, muscle cramps, night sweats, sleeping problems, sore throat, urinary leakage, shortness of breath       Past Medical History:  Diagnosis Date   Arthritis     Colon polyps     Depression     Dysrhythmia     GERD (gastroesophageal reflux disease)     Headache     Hiatal hernia     Hypertension     TIA (transient ischemic attack)                 Past Surgical History:  Procedure Laterality Date   ABDOMINAL HYSTERECTOMY       ABLATION        neuro- burned a nerve in my back   cataract surgery       CHOLECYSTECTOMY       HERNIA REPAIR       HIATAL HERNIA REPAIR N/A 02/09/2023    Procedure: LAPAROSCOPIC HIATAL HERNIA REPAIR; UPPER ENDOSCOPY;  Surgeon: Gladis Cough, MD;  Location: [facility];  Service: General;  Laterality: N/A;   INGUINAL HERNIA REPAIR       PACEMAKER IMPLANT       tension free transvaginal tape procedure  Social History Brendia Dampier  reports that she has never smoked. She has never used smokeless tobacco. She reports that she does not drink alcohol and does not use drugs.   family history includes Aneurysm in her mother; Arrhythmia in her sister; Colon cancer in her paternal uncle; Colon polyps in her brother and sister; Diabetes in her daughter; Heart disease in her father and sister.   Allergies      Allergies  Allergen Reactions   Amoxicillin-Pot Clavulanate Diarrhea            PHYSICAL EXAMINATION: Vital signs: BP 124/70   Pulse 72   Ht 4' 8 (1.422 m)   Wt 104 lb (47.2 kg)   BMI 23.32 kg/m    Constitutional: Frail thin elderly female, no acute distress Psychiatric: alert and oriented x3, cooperative.  Slightly flat affect Eyes: extraocular movements intact, anicteric, conjunctiva pink Mouth: oral pharynx moist, no lesions Neck: supple no lymphadenopathy Cardiovascular: heart regular rate and rhythm, no murmur Lungs: clear to auscultation bilaterally Abdomen: soft, nontender, nondistended, no obvious ascites, no peritoneal signs, normal bowel sounds, no organomegaly Rectal: Deferred until colonoscopy Extremities: no clubbing, cyanosis, or lower extremity edema bilaterally Skin: no lesions on visible extremities Neuro: No focal deficits.  Cranial nerves intact   ASSESSMENT:   1.  Chronic GERD. 2.  History of symptomatic paraesophageal hernia status post repair 3.  Esophageal stricture status post recent EGD with dilation.  Improvement in some elements of swallowing thereafter. 4.  Chronic diarrhea.  Presumably IBS.  Rule out other entities. 5.  CT scan with thickening of the left colon.  Rule out colitis. 6.  Chronic intermittent right sided pain.  Etiology uncertain. 7.  Chronic nausea 8.  Esophageal dysmotility 9.  Multiple general medical problems and behavioral health issues   PLAN:   1.  Continue PPI 2.  Chew food well 3.  Okay to use Imodium for diarrhea 4.  Scheduling colonoscopy with biopsies to evaluate CT scan abnormality and chronic diarrhea.The nature of the procedure, as well as the risks, benefits, and alternatives were carefully and thoroughly reviewed with the patient. Ample time for discussion and questions allowed. The patient understood, was satisfied, and agreed to proceed. 5.  Prescribe Zofran  4 mg.  1 p.o. twice daily as needed.  #60.  Refills.  Recommended taking upon wakening to see if this might thwart her nausea. 6.  Discussion on esophageal dysmotility 7.  Ongoing general medical care with PCP and other specialists

## 2024-08-23 NOTE — Op Note (Signed)
 [facility] Patient Name: Janet Bailey Procedure Date: 08/23/2024 3:11 PM MRN: [id number] Endoscopist: Norleen SAILOR. Abran , MD, [id number] Age: 78 Referring MD:  Date of Birth: 10/16/1946 Gender: Female Account #: [id number] Procedure:                Colonoscopy with cold snare polypectomy x 1; with                            biopsies Indications:              Abdominal pain in the right lower quadrant, Chronic                            diarrhea, Abnormal CT of the GI tract Medicines:                Monitored Anesthesia Care Procedure:                Pre-Anesthesia Assessment:                           - Prior to the procedure, a History and Physical                            was performed, and patient medications and                            allergies were reviewed. The patient's tolerance of                            previous anesthesia was also reviewed. The risks                            and benefits of the procedure and the sedation                            options and risks were discussed with the patient.                            All questions were answered, and informed consent                            was obtained. Prior Anticoagulants: The patient has                            taken no anticoagulant or antiplatelet agents. ASA                            Grade Assessment: II - A patient with mild systemic                            disease. After reviewing the risks and benefits,                            the patient was deemed in satisfactory condition to  undergo the procedure.                           After obtaining informed consent, the colonoscope                            was passed under direct vision. Throughout the                            procedure, the patient's blood pressure, pulse, and                            oxygen saturations were monitored continuously. The                            Olympus Scope SN:  [id number] was introduced through                            the anus and advanced to the the cecum, identified                            by appendiceal orifice and ileocecal valve. The                            terminal ileum, ileocecal valve, appendiceal                            orifice, and rectum were photographed. The quality                            of the bowel preparation was excellent. The                            colonoscopy was performed without difficulty. The                            patient tolerated the procedure well. The bowel                            preparation used was SUPREP via split dose                            instruction. Scope In: 3:27:22 PM Scope Out: 3:44:51 PM Scope Withdrawal Time: 0 hours 12 minutes 36 seconds  Total Procedure Duration: 0 hours 17 minutes 29 seconds  Findings:                 The terminal ileum appeared normal.                           A 7 mm polyp was found in the sigmoid colon. The                            polyp was removed with a cold snare. Resection and  retrieval were complete.                           Multiple diverticula were found in the sigmoid                            colon.                           The exam was otherwise without abnormality on                            direct and retroflexion views.                           Biopsies for histology were taken with a cold                            forceps from the entire colon for evaluation of                            microscopic colitis. Complications:            No immediate complications. Estimated blood loss:                            None. Estimated Blood Loss:     Estimated blood loss: none. Impression:               - The examined portion of the ileum was normal.                           - One 7 mm polyp in the sigmoid colon, removed with                            a cold snare. Resected and retrieved.                            - Diverticulosis in the sigmoid colon.                           - The examination was otherwise normal on direct                            and retroflexion views.                           - Biopsies were taken with a cold forceps from the                            entire colon for evaluation of microscopic colitis. Recommendation:           - Repeat colonoscopy is not recommended for                            surveillance.                           -  Patient has a contact number available for                            emergencies. The signs and symptoms of potential                            delayed complications were discussed with the                            patient. Return to normal activities tomorrow.                            Written discharge instructions were provided to the                            patient.                           - Resume previous diet.                           - Continue present medications.                           - Await pathology results. Norleen SAILOR. Abran, MD 08/23/2024 3:53:53 PM This report has been signed electronically.

## 2024-08-23 NOTE — Patient Instructions (Signed)
-  Handout on polyp and diverticulosis provided. -await pathology results. -repeat colonoscopy not recommend for surveillance recommended.  -Continue present medications.    YOU HAD AN ENDOSCOPIC PROCEDURE TODAY AT THE [facility]:   Refer to the procedure report that was given to you for any specific questions about what was found during the examination.  If the procedure report does not answer your questions, please call your gastroenterologist to clarify.  If you requested that your care partner not be given the details of your procedure findings, then the procedure report has been included in a sealed envelope for you to review at your convenience later.  YOU SHOULD EXPECT: Some feelings of bloating in the abdomen. Passage of more gas than usual.  Walking can help get rid of the air that was put into your GI tract during the procedure and reduce the bloating. If you had a lower endoscopy (such as a colonoscopy or flexible sigmoidoscopy) you may notice spotting of blood in your stool or on the toilet paper. If you underwent a bowel prep for your procedure, you may not have a normal bowel movement for a few days.  Please Note:  You might notice some irritation and congestion in your nose or some drainage.  This is from the oxygen used during your procedure.  There is no need for concern and it should clear up in a day or so.  SYMPTOMS TO REPORT IMMEDIATELY:  Following lower endoscopy (colonoscopy or flexible sigmoidoscopy):  Excessive amounts of blood in the stool  Significant tenderness or worsening of abdominal pains  Swelling of the abdomen that is new, acute  Fever of 100F or higher   For urgent or emergent issues, a gastroenterologist can be reached at any hour by calling [phone]. Do not use MyChart messaging for urgent concerns.    DIET:  We do recommend a small meal at first, but then you may proceed to your regular diet.  Drink plenty of fluids but you should  avoid alcoholic beverages for 24 hours.  ACTIVITY:  You should plan to take it easy for the rest of today and you should NOT DRIVE or use heavy machinery until tomorrow (because of the sedation medicines used during the test).    FOLLOW UP: Our staff will call the number listed on your records the next business day following your procedure.  We will call around 7:15- 8:00 am to check on you and address any questions or concerns that you may have regarding the information given to you following your procedure. If we do not reach you, we will leave a message.     If any biopsies were taken you will be contacted by phone or by letter within the next 1-3 weeks.  Please call us  at [phone] if you have not heard about the biopsies in 3 weeks.    SIGNATURES/CONFIDENTIALITY: You and/or your care partner have signed paperwork which will be entered into your electronic medical record.  These signatures attest to the fact that that the information above on your After Visit Summary has been reviewed and is understood.  Full responsibility of the confidentiality of this discharge information lies with you and/or your care-partner.

## 2024-08-23 NOTE — Progress Notes (Signed)
 Report to PACU, RN, vss, BBS= Clear.

## 2024-08-23 NOTE — Progress Notes (Signed)
 Called to room to assist during endoscopic procedure.  Patient ID and intended procedure confirmed with present staff. Received instructions for my participation in the procedure from the performing physician.

## 2024-08-24 ENCOUNTER — Telehealth: Payer: Self-pay

## 2024-08-24 NOTE — Telephone Encounter (Signed)
  Follow up Call-     08/23/2024    2:38 PM 08/23/2024    2:33 PM 03/02/2024   10:31 AM 01/02/2022    2:14 PM  Call back number  Post procedure Call Back phone  # [phone]  [phone]  Permission to leave phone message  Yes Yes Yes     Patient questions:  Do you have a fever, pain , or abdominal swelling? No. Pain Score  0 *  Have you tolerated food without any problems? Yes.    Have you been able to return to your normal activities? Yes.    Do you have any questions about your discharge instructions: Diet   No. Medications  No. Follow up visit  No.  Do you have questions or concerns about your Care? No.  Actions: * If pain score is 4 or above: No action needed, pain <4.

## 2024-08-28 LAB — SURGICAL PATHOLOGY

## 2024-08-30 ENCOUNTER — Ambulatory Visit: Payer: Self-pay | Admitting: Internal Medicine

## 2024-10-17 ENCOUNTER — Encounter: Payer: Self-pay | Admitting: Internal Medicine
